# Patient Record
Sex: Female | Born: 1995 | Race: White | Hispanic: No | Marital: Single | Smoking: Former smoker
Health system: Southern US, Community
[De-identification: ages and names within clinical notes are randomized; demographics above are authoritative.]

## PROBLEM LIST (undated history)

## (undated) DIAGNOSIS — F329 Major depressive disorder, single episode, unspecified: Secondary | ICD-10-CM

## (undated) DIAGNOSIS — N946 Dysmenorrhea, unspecified: Secondary | ICD-10-CM

## (undated) DIAGNOSIS — G90A Postural orthostatic tachycardia syndrome (POTS): Secondary | ICD-10-CM

## (undated) DIAGNOSIS — F419 Anxiety disorder, unspecified: Secondary | ICD-10-CM

## (undated) DIAGNOSIS — G43109 Migraine with aura, not intractable, without status migrainosus: Secondary | ICD-10-CM

## (undated) DIAGNOSIS — N83209 Unspecified ovarian cyst, unspecified side: Secondary | ICD-10-CM

## (undated) DIAGNOSIS — F32A Depression, unspecified: Secondary | ICD-10-CM

## (undated) DIAGNOSIS — J45909 Unspecified asthma, uncomplicated: Secondary | ICD-10-CM

## (undated) DIAGNOSIS — T1491XA Suicide attempt, initial encounter: Secondary | ICD-10-CM

## (undated) HISTORY — PX: APPENDECTOMY: SHX54

## (undated) HISTORY — DX: Postural orthostatic tachycardia syndrome (POTS): G90.A

## (undated) HISTORY — DX: Dysmenorrhea, unspecified: N94.6

## (undated) HISTORY — DX: Unspecified ovarian cyst, unspecified side: N83.209

## (undated) HISTORY — DX: Migraine with aura, not intractable, without status migrainosus: G43.109

---

## 2015-11-27 ENCOUNTER — Encounter (HOSPITAL_COMMUNITY): Payer: Self-pay | Admitting: Emergency Medicine

## 2015-11-27 ENCOUNTER — Emergency Department (HOSPITAL_COMMUNITY): Payer: Self-pay

## 2015-11-27 ENCOUNTER — Emergency Department (HOSPITAL_COMMUNITY)
Admission: EM | Admit: 2015-11-27 | Discharge: 2015-11-27 | Disposition: A | Discharge status: Home / Self Care | Payer: Self-pay | Attending: Emergency Medicine | Admitting: Emergency Medicine

## 2015-11-27 DIAGNOSIS — F17200 Nicotine dependence, unspecified, uncomplicated: Secondary | ICD-10-CM | POA: Insufficient documentation

## 2015-11-27 DIAGNOSIS — J208 Acute bronchitis due to other specified organisms: Secondary | ICD-10-CM | POA: Insufficient documentation

## 2015-11-27 DIAGNOSIS — R05 Cough: Secondary | ICD-10-CM

## 2015-11-27 DIAGNOSIS — R059 Cough, unspecified: Secondary | ICD-10-CM

## 2015-11-27 DIAGNOSIS — J45909 Unspecified asthma, uncomplicated: Secondary | ICD-10-CM | POA: Insufficient documentation

## 2015-11-27 HISTORY — DX: Unspecified asthma, uncomplicated: J45.909

## 2015-11-27 MED ORDER — BENZONATATE 100 MG PO CAPS: 100.0000 mg | ORAL_CAPSULE | Freq: Three times a day (TID) | ORAL | Status: DC | PRN

## 2015-11-27 MED ORDER — PREDNISONE 20 MG PO TABS: 40.0000 mg | ORAL_TABLET | Freq: Every day | ORAL | Status: DC

## 2015-11-27 NOTE — ED Notes (Signed)
Pt. reports persistent productive cough with chest congestion for 3 weeks , denies fever /respirations unlabored , pt. added mild sore throat .

## 2015-11-27 NOTE — Discharge Instructions (Signed)

## 2015-11-27 NOTE — ED Provider Notes (Signed)
CSN: [id number]     Arrival date & time 11/27/15  1913 History   First MD Initiated Contact with Patient 11/27/15 2314     Chief Complaint  Patient presents with  . Cough     (Consider location/radiation/quality/duration/timing/severity/associated sxs/prior Treatment) Patient is a 20 y.o. female presenting with cough. The history is provided by the patient. No language interpreter was used.  Cough Cough characteristics:  Productive and harsh Sputum characteristics:  Clear Severity:  Moderate Onset quality:  Gradual Duration:  3 weeks Timing:  Intermittent Progression:  Improving Chronicity:  New Smoker: yes   Context: upper respiratory infection   Relieved by:  Nothing Worsened by:  Deep breathing Ineffective treatments:  Beta-agonist inhaler Associated symptoms: no chest pain, no chills, no fever, no myalgias, no rash, no rhinorrhea, no shortness of breath and no sinus congestion   Risk factors: recent infection (all symptoms except cough have resolved)     Past Medical History  Diagnosis Date  . Asthma    Past Surgical History  Procedure Laterality Date  . Appendectomy     No family history on file. Social History  Substance Use Topics  . Smoking status: Current Every Day Smoker  . Smokeless tobacco: None  . Alcohol Use: No   OB History    No data available      Review of Systems  Constitutional: Negative for fever and chills.  HENT: Negative for rhinorrhea.   Respiratory: Positive for cough. Negative for shortness of breath.   Cardiovascular: Negative for chest pain.  Gastrointestinal: Negative for nausea and vomiting.  Musculoskeletal: Negative for myalgias.  Skin: Negative for rash.  Neurological: Negative for syncope.  All other systems reviewed and are negative.   Allergies  Review of patient's allergies indicates no known allergies.  Home Medications   Prior to Admission medications   Medication Sig Start Date End Date Taking? Authorizing  Provider  benzonatate (TESSALON) 100 MG capsule Take 1 capsule (100 mg total) by mouth 3 (three) times daily as needed for cough. 11/27/15   Antony Madura, PA-C  predniSONE (DELTASONE) 20 MG tablet Take 2 tablets (40 mg total) by mouth daily. 11/27/15   Antony Madura, PA-C   BP 117/62 mmHg  Pulse 60  Temp(Src) 98.2 F (36.8 C) (Oral)  Resp 18  Ht 5' (1.524 m)  Wt 39.066 kg  BMI 16.82 kg/m2  SpO2 100%  LMP 11/15/2015 (Approximate)   Physical Exam  Constitutional: She is oriented to person, place, and time. She appears well-developed and well-nourished. No distress.  Nontoxic appearing  HENT:  Head: Normocephalic and atraumatic.  Eyes: Conjunctivae and EOM are normal. No scleral icterus.  Neck: Normal range of motion.  Cardiovascular: Normal rate, regular rhythm and intact distal pulses.   Pulmonary/Chest: Effort normal and breath sounds normal. No respiratory distress. She has no wheezes. She has no rales.  Lungs CTAB  Musculoskeletal: Normal range of motion.  Neurological: She is alert and oriented to person, place, and time.  Skin: Skin is warm and dry. No rash noted. She is not diaphoretic. No erythema. No pallor.  Psychiatric: She has a normal mood and affect. Her behavior is normal.  Nursing note and vitals reviewed.   ED Course  Procedures (including critical care time) Labs Review Labs Reviewed - No data to display  Imaging Review Dg Chest 2 View  11/27/2015  CLINICAL DATA:  Cough for 1 month. EXAM: CHEST  2 VIEW COMPARISON:  None. FINDINGS: The cardiac silhouette, mediastinal and hilar  contours are normal. The lungs demonstrate mild hyperinflation. There is also mild peribronchial thickening which could be related to smoking or bronchitis. No infiltrates or effusions. No pneumothorax. The bony thorax is intact. IMPRESSION: Hyperinflation and mild peribronchial thickening could be due to smoking, bronchitis or reactive airways disease. No focal infiltrates. Electronically  Signed   By: Rudie MeyerP.  Gallerani M.D.   On: 11/27/2015 21:29   I have personally reviewed and evaluated these images and lab results as part of my medical decision-making.   EKG Interpretation None      11:44 PM Patient PERC negative.  MDM   Final diagnoses:  Viral bronchitis  Cough    Pt CXR negative for acute infiltrate. Patients symptoms are consistent with likely viral bronchitis and secondary residual cough. Discussed that antibiotics are not indicated for viral infections. Patient will be discharged with symptomatic treatment. She verbalizes understanding and is agreeable with plan. Patient discharged in satisfactory condition with no unaddressed concerns.   Filed Vitals:   11/27/15 2021 11/27/15 2250  BP: 121/81 117/62  Pulse: 60 60  Temp: 98.6 F (37 C) 98.2 F (36.8 C)  TempSrc: Oral   Resp: 18 18  Height: 5' (1.524 m)   Weight: 39.066 kg   SpO2: 100% 100%     Antony MaduraKelly Holly Iannaccone, PA-C 11/27/15 2344  Lavera Guiseana Duo Liu, MD 11/28/15 1136

## 2016-06-16 ENCOUNTER — Emergency Department (HOSPITAL_COMMUNITY)
Admission: EM | Admit: 2016-06-16 | Discharge: 2016-06-17 | Disposition: A | Discharge status: Other Acute Inpatient Hospital | Payer: BLUE CROSS/BLUE SHIELD | Attending: Emergency Medicine | Admitting: Emergency Medicine

## 2016-06-16 ENCOUNTER — Encounter (HOSPITAL_COMMUNITY): Payer: Self-pay | Admitting: Emergency Medicine

## 2016-06-16 DIAGNOSIS — F419 Anxiety disorder, unspecified: Secondary | ICD-10-CM

## 2016-06-16 DIAGNOSIS — J45909 Unspecified asthma, uncomplicated: Secondary | ICD-10-CM | POA: Diagnosis not present

## 2016-06-16 DIAGNOSIS — F329 Major depressive disorder, single episode, unspecified: Secondary | ICD-10-CM | POA: Diagnosis not present

## 2016-06-16 DIAGNOSIS — F17200 Nicotine dependence, unspecified, uncomplicated: Secondary | ICD-10-CM | POA: Diagnosis not present

## 2016-06-16 DIAGNOSIS — F32A Depression, unspecified: Secondary | ICD-10-CM

## 2016-06-16 DIAGNOSIS — Z79899 Other long term (current) drug therapy: Secondary | ICD-10-CM | POA: Diagnosis not present

## 2016-06-16 HISTORY — DX: Anxiety disorder, unspecified: F41.9

## 2016-06-16 LAB — COMPREHENSIVE METABOLIC PANEL
ALT: 12 U/L — ABNORMAL LOW (ref 14–54)
AST: 22 U/L (ref 15–41)
Albumin: 4.2 g/dL (ref 3.5–5.0)
Alkaline Phosphatase: 47 U/L (ref 38–126)
Anion gap: 7 (ref 5–15)
BUN: 10 mg/dL (ref 6–20)
CALCIUM: 9.2 mg/dL (ref 8.9–10.3)
CO2: 24 mmol/L (ref 22–32)
Chloride: 105 mmol/L (ref 101–111)
Creatinine, Ser: 0.73 mg/dL (ref 0.44–1.00)
GFR calc non Af Amer: >60 mL/min (ref >60)
Glucose, Bld: 91 mg/dL (ref 65–99)
Potassium: 3.7 mmol/L (ref 3.5–5.1)
SODIUM: 136 mmol/L (ref 135–145)
Total Bilirubin: 0.4 mg/dL (ref 0.3–1.2)
Total Protein: 6.6 g/dL (ref 6.5–8.1)

## 2016-06-16 LAB — ACETAMINOPHEN LEVEL: Acetaminophen (Tylenol), Serum: <10 ug/mL — ABNORMAL LOW (ref 10–30)

## 2016-06-16 LAB — RAPID URINE DRUG SCREEN, HOSP PERFORMED
Amphetamines: NOT DETECTED
Barbiturates: NOT DETECTED
Benzodiazepines: NOT DETECTED
Cocaine: NOT DETECTED
OPIATES: NOT DETECTED
TETRAHYDROCANNABINOL: POSITIVE — AB

## 2016-06-16 LAB — CBC
HCT: 34.9 % — ABNORMAL LOW (ref 36.0–46.0)
HEMOGLOBIN: 11.5 g/dL — AB (ref 12.0–15.0)
MCH: 27.8 pg (ref 26.0–34.0)
MCHC: 33 g/dL (ref 30.0–36.0)
MCV: 84.5 fL (ref 78.0–100.0)
PLATELETS: 323 10*3/uL (ref 150–400)
RBC: 4.13 MIL/uL (ref 3.87–5.11)
RDW: 12.6 % (ref 11.5–15.5)
WBC: 8.2 10*3/uL (ref 4.0–10.5)

## 2016-06-16 LAB — ETHANOL

## 2016-06-16 LAB — SALICYLATE LEVEL

## 2016-06-16 NOTE — Progress Notes (Signed)
[facility].  Patient assigned to Bed 4 Accepting is Donell SievertSpencer Simon, [place] Call report to [phone].  [facility]ative was [facility] Torri.  Lot Medford K. Sherlon HandingHarris, LCAS-A, LPC-A, [facility]  Counselor 06/16/2016 10:54 PM

## 2016-06-16 NOTE — ED Notes (Signed)
TTS completed. 

## 2016-06-16 NOTE — BH Assessment (Signed)
Tele Assessment Note   Dawn Meza is an 21 y.o. female, Caucasian who [facility]: Pt states "ive been having really bad anxiety attacks, anything can trigger it, i've had thoughts of just giving up." Pt denies having a plan, just doesn't know how she can continue. Denies pain. Denies drinking alcohol, uses weed. Patient states primary concern is of feelings of depression/ lack of energy and panic attacks. Patient states that she was at work today and had a panic attack. Patient states that she is in college online, and does live alone. Patient states that she has been battling depression/anxiety. Patient denies SI/HI and AVH. Patient denies hx. Of S.A. Patient denies hx of inpatient or outpatient psych care.  Patient is dressed in scrubs and is alert and oriented x4. Patient speech was within normal limits and motor behavior appeared normal. Patient thought process is coherent. Patient  does not appear to be responding to internal stimuli. Patient was cooperative throughout the assessment and states that  she is agreeable to inpatient psychiatric treatment.   Diagnosis: Major Depressive Disorder, Current Episode, Severe  Past Medical History:  Past Medical History:  Diagnosis Date  . Anxiety   . Asthma     Past Surgical History:  Procedure Laterality Date  . APPENDECTOMY      Family History: No family history on file.  Social History:  reports that she has been smoking.  She does not have any smokeless tobacco history on file. She reports that she does not drink alcohol or use drugs.  Additional Social History:  Alcohol / Drug Use Pain Medications: SEE MAR Prescriptions: SEE MAR Over the Counter: SEE MAR History of alcohol / drug use?: No history of alcohol / drug abuse  CIWA: CIWA-Ar BP: 133/59 Pulse Rate: 111 COWS:    PATIENT STRENGTHS: (choose at least two) Average or above average intelligence Capable of independent living Communication  skills  Allergies: No Known Allergies  Home Medications:  (Not in a hospital admission)  OB/GYN Status:  Patient's last menstrual period was 05/30/2016.  General Assessment Data [facility] Assessment: In system Is this a Tele or Face-to-Face Assessment?: Tele Assessment Is this an Initial Assessment or a Re-assessment for this encounter?: Initial Assessment Marital status: Single Maiden name: n/a Is patient pregnant?: No Pregnancy Status: No Living Arrangements: Alone Can pt return to current living arrangement?: Yes Admission Status: Voluntary Is patient capable of signing voluntary admission?: Yes Referral Source: Self/Family/Friend Insurance type: BCBS     Crisis Care Plan Living Arrangements: Alone Name of Psychiatrist: none Name of Therapist: none  Education Status Is patient currently in school?: No Current Grade: n/a Highest grade of school patient has completed: some college  Name of school: n/a Contact person: none given  Risk to self with the past 6 months Suicidal Ideation: No Has patient been a risk to self within the past 6 months prior to admission? : No Suicidal Intent: No Has patient had any suicidal intent within the past 6 months prior to admission? : No Is patient at risk for suicide?: Yes Suicidal Plan?: No Has patient had any suicidal plan within the past 6 months prior to admission? : No Access to Means: No What has been your use of drugs/alcohol within the last 12 months?: none Previous Attempts/Gestures: No How many times?: 0 Other Self Harm Risks: none noted Triggers for Past Attempts: None known Intentional Self Injurious Behavior: None Family Suicide History: No  Recent stressful life event(s): Turmoil (Comment) Persecutory voices/beliefs?: No Depression: Yes Depression Symptoms: Despondent, Insomnia, Tearfulness, Isolating, Fatigue, Guilt, Loss of interest in usual pleasures, Feeling worthless/self  pity Substance abuse history and/or treatment for substance abuse?: No Suicide prevention information given to non-admitted patients: Yes  Risk to Others within the past 6 months Homicidal Ideation: No Does patient have any lifetime risk of violence toward others beyond the six months prior to admission? : No Thoughts of Harm to Others: No Current Homicidal Intent: No Current Homicidal Plan: No Access to Homicidal Means: No Identified Victim: none History of harm to others?: No Assessment of Violence: None Noted Violent Behavior Description: none Does patient have access to weapons?: No Criminal Charges Pending?: No Does patient have a court date: No Is patient on probation?: No  Psychosis Hallucinations: None noted Delusions: None noted  Mental Status Report Appearance/Hygiene: In scrubs Eye Contact: Fair Motor Activity: Freedom of movement Speech: Logical/coherent Level of Consciousness: Alert Mood: Depressed Affect: Depressed Anxiety Level: Panic Attacks Panic attack frequency: weekly Most recent panic attack: 06-16-16 Thought Processes: Relevant Judgement: Unimpaired Orientation: Person, Place, Time, Situation, Appropriate for developmental age Obsessive Compulsive Thoughts/Behaviors: None  Cognitive Functioning Concentration: Normal Memory: Recent Intact, Remote Intact IQ: Average Insight: Fair Impulse Control: Poor Appetite: Fair Weight Loss: 0 Weight Gain: 0 Sleep: Decreased Total Hours of Sleep: 5 Vegetative Symptoms: None  ADLScreening [facility] Assessment Services) Patient's cognitive ability adequate to safely complete daily activities?: Yes Patient able to express need for assistance with ADLs?: Yes Independently performs ADLs?: Yes (appropriate for developmental age)  Prior Inpatient Therapy Prior Inpatient Therapy: No Prior Therapy Dates: n/a Prior Therapy Facilty/Provider(s): n/a Reason for Treatment: n/a  Prior Outpatient Therapy Prior  Outpatient Therapy: No Prior Therapy Dates: n/a Prior Therapy Facilty/Provider(s): n/a Reason for Treatment: n/a Does patient have an ACCT team?: No Does patient have Intensive In-House Services?  : No Does patient have [facility] services? : No Does patient have P4CC services?: No  ADL Screening (condition at time of admission) Patient's cognitive ability adequate to safely complete daily activities?: Yes Is the patient deaf or have difficulty hearing?: No Does the patient have difficulty seeing, even when wearing glasses/contacts?: No Does the patient have difficulty concentrating, remembering, or making decisions?: No Patient able to express need for assistance with ADLs?: Yes Does the patient have difficulty dressing or bathing?: No Independently performs ADLs?: Yes (appropriate for developmental age) Does the patient have difficulty walking or climbing stairs?: No Weakness of Legs: None Weakness of Arms/Hands: None       Abuse/Neglect Assessment (Assessment to be complete while patient is alone) Physical Abuse: Denies Verbal Abuse: Denies Sexual Abuse: Denies Exploitation of patient/patient's resources: Denies Self-Neglect: Denies Values / Beliefs Cultural Requests During Hospitalization: None Spiritual Requests During Hospitalization: None   Advance Directives (For Healthcare) Does Patient Have a Medical Advance Directive?: No    Additional Information 1:1 In Past 12 Months?: No CIRT Risk: No Elopement Risk: No Does patient have medical clearance?: Yes     Disposition: Per Karleen Hampshire, PA does not meet inpatient criteria recommend OBS Disposition Initial Assessment Completed for this Encounter: Yes Disposition of Patient: Other dispositions (TBD)  Dawn Meza Dawn Meza 06/16/2016 10:32 PM

## 2016-06-16 NOTE — ED Notes (Signed)
Staffing called for a sitter 

## 2016-06-16 NOTE — ED Triage Notes (Signed)
Pt states "ive been having really bad anxiety attacks, anything can trigger it, i've had thoughts of just giving up." Pt denies having a plan, just doesn't know how she can continue. Denies pain. Denies drinking alcohol, uses weed.

## 2016-06-16 NOTE — ED Notes (Signed)
Staffing Office advised do not have sitter at this time.

## 2016-06-16 NOTE — ED Provider Notes (Signed)
[facility] Provider Note   CSN: [id number] Arrival date & time: 06/16/16  1835     History   Chief Complaint Chief Complaint  Patient presents with  . Suicidal  . Anxiety  . Medical Clearance    HPI Dawn Meza is a 21 y.o. female.  The history is provided by the patient.  Mental Health Problem  Presenting symptoms: depression   Presenting symptoms: no hallucinations, no homicidal ideas, no suicidal thoughts, no suicidal threats and no suicide attempt   Presenting symptoms comment:  Anxiety  Patient accompanied by:  Family member Degree of incapacity (severity):  Moderate Onset quality:  Gradual Duration: several years. Timing:  Intermittent Progression:  Worsening Chronicity:  Chronic Context: drug abuse (marijuana)   Context: not alcohol use, not medication, not recent medication change and not stressful life event   Treatment compliance:  Untreated Relieved by:  Nothing Worsened by:  Nothing Associated symptoms: anhedonia, anxiety and fatigue   Associated symptoms: no chest pain, no feelings of worthlessness and no poor judgment   Risk factors: hx of mental illness   Risk factors: no hx of suicide attempts       Past Medical History:  Diagnosis Date  . Anxiety   . Asthma     There are no active problems to display for this patient.   Past Surgical History:  Procedure Laterality Date  . APPENDECTOMY      OB History    No data available       Home Medications    Prior to Admission medications   Medication Sig Start Date End Date Taking? Authorizing Provider  benzonatate (TESSALON) 100 MG capsule Take 1 capsule (100 mg total) by mouth 3 (three) times daily as needed for cough. 11/27/15   Antony MaduraKelly Humes, PA-C  predniSONE (DELTASONE) 20 MG tablet Take 2 tablets (40 mg total) by mouth daily. 11/27/15   Antony MaduraKelly Humes, PA-C    Family History No family history on file.  Social History Social History  Substance Use Topics  . Smoking status:  Current Every Day Smoker  . Smokeless tobacco: Not on file  . Alcohol use No     Allergies   Patient has no known allergies.   Review of Systems Review of Systems  Constitutional: Positive for fatigue.  Cardiovascular: Negative for chest pain.  Psychiatric/Behavioral: Negative for hallucinations, homicidal ideas and suicidal ideas. The patient is nervous/anxious.   Ten systems are reviewed and are negative for acute change except as noted in the HPI    Physical Exam Updated Vital Signs BP 133/59   Pulse 111   Temp 98.6 F (37 C) (Oral)   Resp 20   Ht 5' (1.524 m)   Wt 84 lb (38.1 kg)   LMP 05/30/2016   SpO2 100%   BMI 16.41 kg/m   Physical Exam  Constitutional: She is oriented to person, place, and time. She appears well-developed and well-nourished. No distress.  HENT:  Head: Normocephalic and atraumatic.  Nose: Nose normal.  Eyes: Conjunctivae and EOM are normal. Pupils are equal, round, and reactive to light. Right eye exhibits no discharge. Left eye exhibits no discharge. No scleral icterus.  Neck: Normal range of motion. Neck supple.  Cardiovascular: Normal rate and regular rhythm.  Exam reveals no gallop and no friction rub.   No murmur heard. Pulmonary/Chest: Effort normal and breath sounds normal. No stridor. No respiratory distress. She has no rales.  Abdominal: Soft. She exhibits no distension. There is no tenderness.  Musculoskeletal: She exhibits no edema or tenderness.  Neurological: She is alert and oriented to person, place, and time.  Skin: Skin is warm and dry. No rash noted. She is not diaphoretic. No erythema.  Psychiatric: She has a normal mood and affect. Thought content is not paranoid and not delusional. She expresses no homicidal and no suicidal ideation. She expresses no suicidal plans and no homicidal plans.  Vitals reviewed.    ED Treatments / Results  Labs (all labs ordered are listed, but only abnormal results are displayed) Labs  Reviewed  COMPREHENSIVE METABOLIC PANEL - Abnormal; Notable for the following:       Result Value   ALT 12 (*)    All other components within normal limits  ACETAMINOPHEN LEVEL - Abnormal; Notable for the following:    Acetaminophen (Tylenol), Serum <10 (*)    All other components within normal limits  CBC - Abnormal; Notable for the following:    Hemoglobin 11.5 (*)    HCT 34.9 (*)    All other components within normal limits  RAPID URINE DRUG SCREEN, HOSP PERFORMED - Abnormal; Notable for the following:    Tetrahydrocannabinol POSITIVE (*)    All other components within normal limits  ETHANOL  SALICYLATE LEVEL    EKG  EKG Interpretation None       Radiology No results found.  Procedures Procedures (including critical care time)  Medications Ordered in ED Medications - No data to display   Initial Impression / Assessment and Plan / ED Course  I have reviewed the triage vital signs and the nursing notes.  Pertinent labs & imaging results that were available during my care of the patient were reviewed by me and considered in my medical decision making (see chart for details).     Medically cleared for [facility] evaluation and management. Patient was evaluated by behavioral health and will be admitted for observation.  Final Clinical Impressions(s) / ED Diagnoses   Final diagnoses:  Depression, unspecified depression type  Anxiety      Nira Conn, MD 06/16/16 [phone]

## 2016-06-17 ENCOUNTER — Telehealth (HOSPITAL_COMMUNITY): Payer: Self-pay | Admitting: Licensed Clinical Social Worker

## 2016-06-17 ENCOUNTER — Observation Stay (HOSPITAL_COMMUNITY)
Admission: EM | Admit: 2016-06-17 | Discharge: 2016-06-17 | Disposition: A | Discharge status: Home / Self Care | Payer: BLUE CROSS/BLUE SHIELD | Source: Intra-hospital | Attending: Psychiatry | Admitting: Psychiatry

## 2016-06-17 ENCOUNTER — Encounter (HOSPITAL_COMMUNITY): Payer: Self-pay | Admitting: *Deleted

## 2016-06-17 DIAGNOSIS — Z79899 Other long term (current) drug therapy: Secondary | ICD-10-CM | POA: Diagnosis not present

## 2016-06-17 DIAGNOSIS — F322 Major depressive disorder, single episode, severe without psychotic features: Secondary | ICD-10-CM | POA: Insufficient documentation

## 2016-06-17 DIAGNOSIS — Z9889 Other specified postprocedural states: Secondary | ICD-10-CM | POA: Diagnosis not present

## 2016-06-17 DIAGNOSIS — Z818 Family history of other mental and behavioral disorders: Secondary | ICD-10-CM | POA: Diagnosis not present

## 2016-06-17 DIAGNOSIS — F332 Major depressive disorder, recurrent severe without psychotic features: Secondary | ICD-10-CM | POA: Diagnosis not present

## 2016-06-17 DIAGNOSIS — Z825 Family history of asthma and other chronic lower respiratory diseases: Secondary | ICD-10-CM

## 2016-06-17 DIAGNOSIS — F4323 Adjustment disorder with mixed anxiety and depressed mood: Secondary | ICD-10-CM | POA: Diagnosis present

## 2016-06-17 MED ORDER — HYDROXYZINE HCL 25 MG PO TABS
25.0000 mg | ORAL_TABLET | Freq: Four times a day (QID) | ORAL | Status: DC | PRN
  Administered 2016-06-17: 25 mg via ORAL
  Filled 2016-06-17: qty 1

## 2016-06-17 MED ORDER — ESCITALOPRAM OXALATE 10 MG PO TABS
10.0000 mg | ORAL_TABLET | Freq: Every day | ORAL | Status: DC
Start: 2016-06-17 — End: 2016-06-17
  Administered 2016-06-17: 10 mg via ORAL
  Filled 2016-06-17: qty 1

## 2016-06-17 MED ORDER — ALUM & MAG HYDROXIDE-SIMETH 200-200-20 MG/5ML PO SUSP: 30.0000 mL | ORAL | Status: DC | PRN

## 2016-06-17 MED ORDER — ALBUTEROL SULFATE HFA 108 (90 BASE) MCG/ACT IN AERS
2.0000 | INHALATION_SPRAY | RESPIRATORY_TRACT | Status: DC | PRN
Start: 2016-06-17 — End: 2016-06-17
  Administered 2016-06-17: 2 via RESPIRATORY_TRACT

## 2016-06-17 MED ORDER — MAGNESIUM HYDROXIDE 400 MG/5ML PO SUSP: 30.0000 mL | Freq: Every day | ORAL | Status: DC | PRN

## 2016-06-17 MED ORDER — TRAZODONE HCL 50 MG PO TABS: 50.0000 mg | ORAL_TABLET | Freq: Every evening | ORAL | Status: DC | PRN

## 2016-06-17 MED ORDER — ACETAMINOPHEN 325 MG PO TABS
650.0000 mg | ORAL_TABLET | Freq: Four times a day (QID) | ORAL | Status: DC | PRN
Start: 2016-06-17 — End: 2016-06-17

## 2016-06-17 MED ORDER — ALBUTEROL SULFATE HFA 108 (90 BASE) MCG/ACT IN AERS: 2.0000 | INHALATION_SPRAY | RESPIRATORY_TRACT | 0 refills | Status: DC | PRN

## 2016-06-17 MED ORDER — ESCITALOPRAM OXALATE 10 MG PO TABS: 10.0000 mg | ORAL_TABLET | Freq: Every day | ORAL | 0 refills | Status: DC

## 2016-06-17 MED ORDER — HYDROXYZINE HCL 25 MG PO TABS: 25.0000 mg | ORAL_TABLET | Freq: Four times a day (QID) | ORAL | 0 refills | Status: DC | PRN

## 2016-06-17 MED ORDER — INFLUENZA VAC SPLIT QUAD 0.5 ML IM SUSY
0.5000 mL | PREFILLED_SYRINGE | INTRAMUSCULAR | Status: DC
  Filled 2016-06-17: qty 0.5

## 2016-06-17 MED ORDER — ALBUTEROL SULFATE HFA 108 (90 BASE) MCG/ACT IN AERS
INHALATION_SPRAY | RESPIRATORY_TRACT | Status: AC
  Administered 2016-06-17: 2 via RESPIRATORY_TRACT
  Filled 2016-06-17: qty 6.7

## 2016-06-17 NOTE — Progress Notes (Signed)
Patient is stressed about everything. This Clinical research associatewriter believes she would benefit from anti anxiety medications. Patient stated she just recently received insurance and is in need of a primary care dr.

## 2016-06-17 NOTE — Discharge Summary (Signed)
[facility] Dis[facility] Summary Note  Patient:  Dawn Meza is an 21 y.o., female MRN:  [id number] DOB:  10/12/1995 Patient phone:  [phone] (home)  Patient address:   [address] [place] [id number],  Total Time spent with patient: 20 minutes  Date of Admission:  06/17/2016 Date of Discharge: 06/07/2016  Reason for Admission:  Anxiety with Panic attacks  Principal Problem:MDD (major depressive disorder), single episode, severe , no psychosis [facility](HCC)  Discharge Diagnoses: Patient Active Problem List   Diagnosis Date Noted  . MDD (major depressive disorder), single episode, severe , no psychosis (HCC) [F32.2] 06/17/2016    Past Psychiatric History: Anxiety  Past Medical History:  Past Medical History:  Diagnosis Date  . Anxiety   . Asthma     Past Surgical History:  Procedure Laterality Date  . APPENDECTOMY     Family History: History reviewed. No pertinent family history. Family Psychiatric  History: unknown Social History:  History  Alcohol Use No     History  Drug Use No    Social History   Social History  . Marital status: Single    Spouse name: N/A  . Number of children: N/A  . Years of education: N/A   Social History Main Topics  . Smoking status: Never Smoker  . Smokeless tobacco: Never Used  . Alcohol use No  . Drug use: No  . Sexual activity: Yes    Partners: Male    Birth control/ protection: None   Other Topics Concern  . None   Social History Narrative  . None    Hospital Course:  Dawn RamMadison Kolenda is a 21 year old Caucasian female [facility] and panic [facility].  Pt denies suicidal/homicidal ideation, denies auditory/visual hallucinations and does not appear to be responding to internal stimuli. Pt was calm and cooperative, alert & oriented, appropriate for the situation and spent the night in the OBS unit without incident. Pt stated she has never been on medication for her anxiety before. Pt works  in a [employer] and is also on [employer] college student. Pt stated she feels safe to go home today and return to her life. Pt was started on Lexapro 10 mg PO QD and Vistaril 25 mg Q6hr PO PRN, prescriptions for these medications were provided to patient upon discharge with instructions to follow up with her PCP for medication management. Pt is also being referred to outpatient therapy services.   Physical Findings: AIMS: Facial and Oral Movements Muscles of Facial Expression: None, normal Lips and Perioral Area: None, normal Jaw: None, normal Tongue: None, normal,Extremity Movements Upper (arms, wrists, hands, fingers): None, normal Lower (legs, knees, ankles, toes): None, normal, Trunk Movements Neck, shoulders, hips: None, normal, Overall Severity Severity of abnormal movements (highest score from questions above): None, normal Incapacitation due to abnormal movements: None, normal Patient's awareness of abnormal movements (rate only patient's report): No Awareness, Dental Status Current problems with teeth and/or dentures?: No Does patient usually wear dentures?: No  CIWA:    COWS:     Musculoskeletal: Strength & Muscle Tone: within normal limits Gait & Station: normal Patient leans: N/A  Psychiatric Specialty Exam: Physical Exam  Constitutional: She is oriented to person, place, and time. She appears well-developed and well-nourished.  Musculoskeletal: Normal range of motion.  Neurological: She is alert and oriented to person, place, and time.  Skin: Skin is warm and dry.    Review of Systems  Psychiatric/Behavioral: Positive for  depression. Negative for hallucinations, memory loss, substance abuse and suicidal ideas. The patient is nervous/anxious. The patient does not have insomnia.   All other systems reviewed and are negative.   Blood pressure (!) 98/55, pulse 67, temperature 98.4 F (36.9 C), temperature source Oral, resp. rate 16, height 5' (1.524 m), weight 37.6 kg  (83 lb), last menstrual period 05/30/2016, SpO2 100 %.Body mass index is 16.21 kg/m.  General Appearance: Casual  Eye Contact:  Good  Speech:  Clear and Coherent and Normal Rate  Volume:  Normal  Mood:  Anxious and Depressed  Affect:  Congruent  Thought Process:  Coherent, Goal Directed and Linear  Orientation:  Full (Time, Place, and Person)  Thought Content:  Logical  Suicidal Thoughts:  No  Homicidal Thoughts:  No  Memory:  Immediate;   Good Recent;   Good Remote;   Fair  Judgement:  Good  Insight:  Good  Psychomotor Activity:  Normal  Concentration:  Concentration: Good and Attention Span: Good  Recall:  Good  Fund of Knowledge:  Good  Language:  Good  Akathisia:  No  Handed:  Right  AIMS (if indicated):     Assets:  Communication Skills Desire for Improvement Financial Resources/Insurance Housing Leisure Time Physical Health Resilience Social Support Transportation Vocational/Educational  ADL's:  Intact  Cognition:  WNL  Sleep:        Have you used any form of tobacco in the last 30 days? (Cigarettes, Smokeless Tobacco, Cigars, and/or Pipes): No  Has this patient used any form of tobacco in the last 30 days? (Cigarettes, Smokeless Tobacco, Cigars, and/or Pipes) Yes, No  Blood Alcohol level:  Lab Results  Component Value Date   ETH <5 06/16/2016    Metabolic Disorder Labs:  No results found for: HGBA1C, MPG No results found for: PROLACTIN No results found for: CHOL, TRIG, HDL, CHOLHDL, VLDL, LDLCALC  See Psychiatric Specialty Exam and Suicide Risk Assessment completed by Attending Physician prior to discharge.  Discharge destination:  Home  Is patient on multiple antipsychotic therapies at discharge:  No   Has Patient had three or more failed trials of antipsychotic monotherapy by history:  No  Recommended Plan for Multiple Antipsychotic Therapies: NA   Allergies as of 06/17/2016   No Known Allergies     Medication List    TAKE these  medications     Indication  albuterol 108 (90 Base) MCG/ACT inhaler Commonly known as:  PROVENTIL HFA;VENTOLIN HFA Inhale 2 puffs into the lungs as needed for wheezing or shortness of breath.  Indication:  Asthma   escitalopram 10 MG tablet Commonly known as:  LEXAPRO Take 1 tablet (10 mg total) by mouth daily. Start taking on:  06/18/2016  Indication:  Generalized Anxiety Disorder   hydrOXYzine 25 MG tablet Commonly known as:  ATARAX/VISTARIL Take 1 tablet (25 mg total) by mouth every 6 (six) hours as needed for anxiety.  Indication:  Anxiety Neurosis        Follow-up recommendations:  Activity:  as tolerated Diet:  Regular  Other:  stay well hydrated  Follow up with PCP for medication management Attend outpatient therapy Remain on Lexapro, it may take several weeks to become therapeutic  Comments:  Discharge home  Signed: Laveda Abbe, NP 06/17/2016, 9:27 AM

## 2016-06-17 NOTE — Discharge Planning (Signed)
[facility] Case Management Discharge Plan :  Will you be returning to the same living situation after discharge:  Yes,  Patient being discharged home  At discharge, do you have transportation home?: Yes,  Mother will provide transport home  Do you have the ability to pay for your medications: Yes,  Patient has insurance  Release of information consent forms completed and in the chart;  Patient's signature needed at discharge.  Patient to Follow up at: Follow-up Information    [facility] Follow up.   Specialty:  Behavioral Health Why:  Personnel with call patient post-discharge (after 3pm on June 17, 2016) Contact information: [address] [place] [id number] [phone]          Safety Planning and Suicide Prevention discussed: Yes,  Patient verbalized understanding   Camelia EngKaren H Stephani Janak 06/17/2016, 9:55 AM
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
## 2016-06-17 NOTE — Progress Notes (Signed)
Written/verbal discharge instructions, prescriptions and follow-up information given to patient with verbalization of understanding;  Patient denies suicidal and homicidal ideation. Suicide Prevention information/materials given to patient  All patient belongings returned to patient at time of discharge. Discharged home in stable condition with mother.

## 2016-06-17 NOTE — BHH Counselor (Signed)
This Clinical research associatewriter made contact with [place] [employer] in regards to scheduling a follow up appointment for this pt. Pt spoke with Pauline AusKimberly Grice who was provided with pt MR number and insurance information. Cala BradfordKimberly suggested that PHP program (Partial Hospitalization Program) for this program. Pt was informed about this program from this Clinical research associatewriter and provided with a brochure. This Clinical research associatewriter informed pt that she would receive a phone call from Donia GuilesJenny Edminson who is the group leader for this program will be contacting her post discharge after 3pm to get some information from her. Pt was receptive of the information provided to her.

## 2016-06-17 NOTE — Progress Notes (Signed)
[facility] OBSERVATION UNIT:  Family/Significant Other Suicide Prevention Education  Suicide Prevention Education:  Education Completed; [name] (Patient's mother), has been identified by the patient as the family member/significant other with whom the patient will be residing, and identified as the person(s) who will aid the patient in the event of a mental health crisis (suicidal ideations/suicide attempt).  With written consent from the patient, the family member/significant other has been provided the following suicide prevention education, prior to the and/or following the discharge of the patient.  The suicide prevention education provided includes the following:  Suicide risk factors  Suicide prevention and interventions  National Suicide Hotline telephone number  [facility] assessment telephone number  [facility] Emergency Assistance 911  [facility] and/or Residential Mobile Crisis Unit telephone number  Request made of family/significant other to:  Remove weapons (e.g., guns, rifles, knives), all items previously/currently identified as safety concern.    Remove drugs/medications (over-the-counter, prescriptions, illicit drugs), all items previously/currently identified as a safety concern.  The family member/significant other verbalizes understanding of the suicide prevention education information provided.  The family member/significant other agrees to remove the items of safety concern listed above.  Dawn Meza 06/17/2016, 10:05 AM   Dawn EngKaren Meza Dawn Lacasse, RN 06/17/16  10:05 AM

## 2016-06-17 NOTE — Progress Notes (Signed)
D:  Patient awake and alert; oriented x 4; she denies suicidal and homicidal ideation and AVH; no self-injurious behaviors noted or reported. A:  Medications given as scheduled;  Emotional support provided; encouraged her to seek assistance with needs/concerns. R:  Safety maintained on unit. 

## 2016-06-18 NOTE — H&P (Signed)
[place] Observation Unit Provider Admission PAA/H&P  Patient Identification: Dawn Meza MRN:  [id number] Date of Evaluation:  06/18/2016 Chief Complaint:  mdd Principal Diagnosis: MDD (major depressive disorder), single episode, severe , no psychosis ([place]) Diagnosis:   Patient Active Problem List   Diagnosis Date Noted  . MDD (major depressive disorder), single episode, severe , no psychosis ([place]) [F32.2] 06/17/2016   History of Present Illness:Dawn Meza is an 21 y.o. female, Caucasian transferred from [facility] ED for stabilization and treatment of really bad anxiety, having thoughts of giving up and reporting that anything can trigger her anxiety.  Pt denies having a plan to hurt self, just doesn't know how she can continue. Denies pain. Denies drinking alcohol, uses weed. Patient states primary concern is of feelings of depression/ lack of energy and panic attacks. Patient states that she was at work today and had a panic attack. Patient states that she is in college online, and does live alone. Patient states that she has been battling depression/anxiety. Patient denies SI/HI and AVH. Patient denies hx. Of S.A. Patient denies hx of inpatient or outpatient psych care.    Past Psychiatric History: None  Is the patient at risk to self? Yes.   Not sure how she can keep herself safe as she is really anxious Has the patient been a risk to self in the past 6 months? No.  Has the patient been a risk to self within the distant past? No.  Is the patient a risk to others? No.  Has the patient been a risk to others in the past 6 months? No.  Has the patient been a risk to others within the distant past? No.   Prior Inpatient Therapy:   Prior Outpatient Therapy:    Alcohol Screening: 1. How often do you have a drink containing alcohol?: Never 9. Have you or someone else been injured as a result of your drinking?: No 10. Has a relative or friend or a doctor or another health worker been concerned  about your drinking or suggested you cut down?: No Alcohol Use Disorder Identification Test Final Score (AUDIT): 0 Brief Intervention: Patient declined brief intervention Substance Abuse History in the last 12 months:  No. Consequences of Substance Abuse: Negative Previous Psychotropic Medications: No  Psychological Evaluations: No  Past Medical History:  Past Medical History:  Diagnosis Date  . Anxiety   . Asthma     Past Surgical History:  Procedure Laterality Date  . APPENDECTOMY     Family History: History reviewed. No pertinent family history. Family Psychiatric History: none reported Tobacco Screening: Have you used any form of tobacco in the last 30 days? (Cigarettes, Smokeless Tobacco, Cigars, and/or Pipes): No Social History:  History  Alcohol Use No     History  Drug Use No    Additional Social History:                           Allergies:  No Known Allergies Lab Results:  Results for orders placed or performed during the hospital encounter of 06/16/16 (from the past 48 hour(s))  Comprehensive metabolic panel     Status: Abnormal   Collection Time: 06/16/16  6:49 PM  Result Value Ref Range   Sodium 136 135 - 145 mmol/L   Potassium 3.7 3.5 - 5.1 mmol/L   Chloride 105 101 - 111 mmol/L   CO2 24 22 - 32 mmol/L   Glucose, Bld 91 65 - 99  mg/dL   BUN 10 6 - 20 mg/dL   Creatinine, Ser 0.73 0.44 - 1.00 mg/dL   Calcium 9.2 8.9 - 10.3 mg/dL   Total Protein 6.6 6.5 - 8.1 g/dL   Albumin 4.2 3.5 - 5.0 g/dL   AST 22 15 - 41 U/L   ALT 12 (L) 14 - 54 U/L   Alkaline Phosphatase 47 38 - 126 U/L   Total Bilirubin 0.4 0.3 - 1.2 mg/dL   GFR calc non Af Amer >60 >60 mL/min   GFR calc Af Amer >60 >60 mL/min    Comment: (NOTE) The eGFR has been calculated using the CKD EPI equation. This calculation has not been validated in all clinical situations. eGFR's persistently <60 mL/min signify possible Chronic Kidney Disease.    Anion gap 7 5 - 15  Ethanol      Status: None   Collection Time: 06/16/16  6:49 PM  Result Value Ref Range   Alcohol, Ethyl (B) <5 <5 mg/dL    Comment:        LOWEST DETECTABLE LIMIT FOR SERUM ALCOHOL IS 5 mg/dL FOR MEDICAL PURPOSES ONLY   Salicylate level     Status: None   Collection Time: 06/16/16  6:49 PM  Result Value Ref Range   Salicylate Lvl <2.9 2.8 - 30.0 mg/dL  Acetaminophen level     Status: Abnormal   Collection Time: 06/16/16  6:49 PM  Result Value Ref Range   Acetaminophen (Tylenol), Serum <10 (L) 10 - 30 ug/mL    Comment:        THERAPEUTIC CONCENTRATIONS VARY SIGNIFICANTLY. A RANGE OF 10-30 ug/mL MAY BE AN EFFECTIVE CONCENTRATION FOR MANY PATIENTS. HOWEVER, SOME ARE BEST TREATED AT CONCENTRATIONS OUTSIDE THIS RANGE. ACETAMINOPHEN CONCENTRATIONS >150 ug/mL AT 4 HOURS AFTER INGESTION AND >50 ug/mL AT 12 HOURS AFTER INGESTION ARE OFTEN ASSOCIATED WITH TOXIC REACTIONS.   cbc     Status: Abnormal   Collection Time: 06/16/16  6:49 PM  Result Value Ref Range   WBC 8.2 4.0 - 10.5 K/uL   RBC 4.13 3.87 - 5.11 MIL/uL   Hemoglobin 11.5 (L) 12.0 - 15.0 g/dL   HCT 34.9 (L) 36.0 - 46.0 %   MCV 84.5 78.0 - 100.0 fL   MCH 27.8 26.0 - 34.0 pg   MCHC 33.0 30.0 - 36.0 g/dL   RDW 12.6 11.5 - 15.5 %   Platelets 323 150 - 400 K/uL  Rapid urine drug screen (hospital performed)     Status: Abnormal   Collection Time: 06/16/16  6:53 PM  Result Value Ref Range   Opiates NONE DETECTED NONE DETECTED   Cocaine NONE DETECTED NONE DETECTED   Benzodiazepines NONE DETECTED NONE DETECTED   Amphetamines NONE DETECTED NONE DETECTED   Tetrahydrocannabinol POSITIVE (A) NONE DETECTED   Barbiturates NONE DETECTED NONE DETECTED    Comment:        DRUG SCREEN FOR MEDICAL PURPOSES ONLY.  IF CONFIRMATION IS NEEDED FOR ANY PURPOSE, NOTIFY LAB WITHIN 5 DAYS.        LOWEST DETECTABLE LIMITS FOR URINE DRUG SCREEN Drug Class       Cutoff (ng/mL) Amphetamine      1000 Barbiturate      200 Benzodiazepine    476 Tricyclics       546 Opiates          300 Cocaine          300 THC  50     Blood Alcohol level:  Lab Results  Component Value Date   ETH <5 00/17/4944    Metabolic Disorder Labs:  No results found for: HGBA1C, MPG No results found for: PROLACTIN No results found for: CHOL, TRIG, HDL, CHOLHDL, VLDL, LDLCALC  Current Medications: No current facility-administered medications for this encounter.    Current Outpatient Prescriptions  Medication Sig Dispense Refill  . albuterol (PROVENTIL HFA;VENTOLIN HFA) 108 (90 Base) MCG/ACT inhaler Inhale 2 puffs into the lungs as needed for wheezing or shortness of breath. 1 Inhaler 0  . escitalopram (LEXAPRO) 10 MG tablet Take 1 tablet (10 mg total) by mouth daily. 30 tablet 0  . hydrOXYzine (ATARAX/VISTARIL) 25 MG tablet Take 1 tablet (25 mg total) by mouth every 6 (six) hours as needed for anxiety. 30 tablet 0   PTA Medications: No prescriptions prior to admission.    Musculoskeletal: Strength & Muscle Tone: within normal limits Gait & Station: normal Patient leans: N/A  Psychiatric Specialty Exam: Physical Exam  ROS  Blood pressure (!) 98/55, pulse 67, temperature 98.4 F (36.9 C), temperature source Oral, resp. rate 16, height 5' (1.524 m), weight 37.6 kg (83 lb), last menstrual period 05/30/2016, SpO2 100 %.Body mass index is 16.21 kg/m.  General Appearance: Disheveled  Eye Contact:  Fair  Speech:  Clear and Coherent and Normal Rate  Volume:  Normal  Mood:  Angry, Depressed and Hopeless  Affect:  Constricted  Thought Process:  Coherent and Goal Directed  Orientation:  Full (Time, Place, and Person)  Thought Content:  Rumination  Suicidal Thoughts:  No  Homicidal Thoughts:  No  Memory:  Immediate;   Fair Recent;   Fair Remote;   Fair  Judgement:  Impaired  Insight:  Present  Psychomotor Activity:  Normal  Concentration:  Concentration: Poor and Attention Span: Fair  Recall:  [employer] of Knowledge:   Fair  Language:  Fair  Akathisia:  No  Handed:  Right  AIMS (if indicated):     Assets:  Desire for Improvement Housing Physical Health  ADL's:  Intact  Cognition:  WNL  Sleep:         Treatment Plan Summary: Plan To admit to [facility] observation unit for stabilization and treatment  Observation Level/Precautions:  15 minute checks Laboratory:  ED labs reviewed Psychotherapy:   Medications:   Consultations:   Discharge Concerns:   Estimated LOS: OtherHampton Abbot, MD 2/1/201810:18 AM

## 2016-06-22 ENCOUNTER — Other Ambulatory Visit (HOSPITAL_COMMUNITY): Payer: BLUE CROSS/BLUE SHIELD

## 2016-07-08 ENCOUNTER — Emergency Department (HOSPITAL_COMMUNITY)
Admission: EM | Admit: 2016-07-08 | Discharge: 2016-07-08 | Disposition: A | Discharge status: Home / Self Care | Payer: BLUE CROSS/BLUE SHIELD | Attending: Emergency Medicine | Admitting: Emergency Medicine

## 2016-07-08 DIAGNOSIS — R1012 Left upper quadrant pain: Secondary | ICD-10-CM | POA: Insufficient documentation

## 2016-07-08 DIAGNOSIS — J45909 Unspecified asthma, uncomplicated: Secondary | ICD-10-CM | POA: Diagnosis not present

## 2016-07-08 DIAGNOSIS — R1010 Upper abdominal pain, unspecified: Secondary | ICD-10-CM

## 2016-07-08 DIAGNOSIS — R1110 Vomiting, unspecified: Secondary | ICD-10-CM

## 2016-07-08 DIAGNOSIS — R112 Nausea with vomiting, unspecified: Secondary | ICD-10-CM | POA: Diagnosis present

## 2016-07-08 LAB — COMPREHENSIVE METABOLIC PANEL
ALT: 13 U/L — AB (ref 14–54)
ANION GAP: 8 (ref 5–15)
AST: 21 U/L (ref 15–41)
Albumin: 5 g/dL (ref 3.5–5.0)
Alkaline Phosphatase: 60 U/L (ref 38–126)
BUN: 12 mg/dL (ref 6–20)
CHLORIDE: 103 mmol/L (ref 101–111)
CO2: 27 mmol/L (ref 22–32)
CREATININE: 0.86 mg/dL (ref 0.44–1.00)
Calcium: 9.4 mg/dL (ref 8.9–10.3)
Glucose, Bld: 107 mg/dL — ABNORMAL HIGH (ref 65–99)
Potassium: 3.9 mmol/L (ref 3.5–5.1)
SODIUM: 138 mmol/L (ref 135–145)
Total Bilirubin: 0.6 mg/dL (ref 0.3–1.2)
Total Protein: 8.2 g/dL — ABNORMAL HIGH (ref 6.5–8.1)

## 2016-07-08 LAB — CBC WITH DIFFERENTIAL/PLATELET
BASOS ABS: 0 10*3/uL (ref 0.0–0.1)
Basophils Relative: 0 %
EOS ABS: 0 10*3/uL (ref 0.0–0.7)
EOS PCT: 0 %
HCT: 40.9 % (ref 36.0–46.0)
HEMOGLOBIN: 13.4 g/dL (ref 12.0–15.0)
LYMPHS ABS: 0.3 10*3/uL — AB (ref 0.7–4.0)
LYMPHS PCT: 3 %
MCH: 27.9 pg (ref 26.0–34.0)
MCHC: 32.8 g/dL (ref 30.0–36.0)
MCV: 85 fL (ref 78.0–100.0)
Monocytes Absolute: 0.2 10*3/uL (ref 0.1–1.0)
Monocytes Relative: 2 %
NEUTROS PCT: 95 %
Neutro Abs: 9.3 10*3/uL — ABNORMAL HIGH (ref 1.7–7.7)
Platelets: 269 10*3/uL (ref 150–400)
RBC: 4.81 MIL/uL (ref 3.87–5.11)
RDW: 12.9 % (ref 11.5–15.5)
WBC: 9.8 10*3/uL (ref 4.0–10.5)

## 2016-07-08 LAB — POC URINE PREG, ED: Preg Test, Ur: NEGATIVE

## 2016-07-08 LAB — LIPASE, BLOOD: LIPASE: 43 U/L (ref 11–51)

## 2016-07-08 MED ORDER — SODIUM CHLORIDE 0.9 % IV BOLUS (SEPSIS)
500.0000 mL | Freq: Once | INTRAVENOUS | Status: AC
  Administered 2016-07-08: 500 mL via INTRAVENOUS

## 2016-07-08 MED ORDER — ONDANSETRON 4 MG PO TBDP: 4.0000 mg | ORAL_TABLET | Freq: Three times a day (TID) | ORAL | 0 refills | Status: DC | PRN

## 2016-07-08 MED ORDER — ONDANSETRON HCL 4 MG/2ML IJ SOLN
4.0000 mg | Freq: Once | INTRAMUSCULAR | Status: AC
  Administered 2016-07-08: 4 mg via INTRAVENOUS
  Filled 2016-07-08: qty 2

## 2016-07-08 MED ORDER — FAMOTIDINE 20 MG PO TABS
20.0000 mg | ORAL_TABLET | Freq: Once | ORAL | Status: AC
  Administered 2016-07-08: 20 mg via ORAL
  Filled 2016-07-08: qty 1

## 2016-07-08 NOTE — ED Triage Notes (Signed)
Pt reports n/v since 1am this morning. Pt denies diarrhea and fever. Pt rates abdomen pain 4/10 and states she thinks its related to vomiting. Pt reports last menstrual cycle 2/8 to 2/12 and menstrual started again this morning. Pt reports having vomiting with last cycle. Pt also reports taking a plan B pill 2/16.

## 2016-07-08 NOTE — ED Provider Notes (Signed)
[facility]-EMERGENCY DEPT Provider Note   CSN: [id number] Arrival date & time: 07/08/16  [id number]     History   Chief Complaint No chief complaint on file.   HPI Dawn Meza is a 21 y.o. female.  HPI  21 year old female presents with vomiting since around 1 AM. Patient states that she got up out of bed and drink some juice and then shortly after started feeling lightheaded, had an onset of vaginal bleeding, and then started vomiting. She states she would get diaphoretic and lightheaded every time before she would vomit. She did not pass out. It has started making her chronic anxiety much worse. She also reports upper abdominal pain, feels like her abdomen is in a knot. Her LMP was earlier this month. This vaginal bleeding is abnormal for her. Just prior to arrival she was able to eat a banana and not vomit but she still feels nauseated and still has abdominal pain. Feels like she got over a flulike or cold illness a couple weeks ago but no other recent illness. One episode of diarrhea. No hematemesis or hematochezia. No urinary symptoms. Took a Plan B on 07/03/16.  Past Medical History:  Diagnosis Date  . Anxiety   . Asthma     Patient Active Problem List   Diagnosis Date Noted  . MDD (major depressive disorder), single episode, severe , no psychosis (HCC) 06/17/2016    Past Surgical History:  Procedure Laterality Date  . APPENDECTOMY      OB History    No data available       Home Medications    Prior to Admission medications   Medication Sig Start Date End Date Taking? Authorizing Provider  albuterol (PROVENTIL HFA;VENTOLIN HFA) 108 (90 Base) MCG/ACT inhaler Inhale 2 puffs into the lungs as needed for wheezing or shortness of breath. Patient taking differently: Inhale 2 puffs into the lungs every 4 (four) hours as needed for wheezing or shortness of breath.  06/17/16  Yes Laveda AbbeLaurie Britton Parks, NP  escitalopram (LEXAPRO) 10 MG tablet Take 1 tablet (10 mg total) by mouth daily.  06/18/16  Yes Laveda AbbeLaurie Britton Parks, NP  levonorgestrel (PLAN B,NEXT CHOICE) 0.75 MG tablet Take 0.75 mg by mouth once.   Yes Historical Provider, MD  hydrOXYzine (ATARAX/VISTARIL) 25 MG tablet Take 1 tablet (25 mg total) by mouth every 6 (six) hours as needed for anxiety. Patient not taking: Reported on 07/08/2016 06/17/16   Laveda AbbeLaurie Britton Parks, NP  ondansetron (ZOFRAN ODT) 4 MG disintegrating tablet Take 1 tablet (4 mg total) by mouth every 8 (eight) hours as needed for nausea or vomiting. 07/08/16   Pricilla LovelessScott Arlett Goold, MD    Family History No family history on file.  Social History Social History  Substance Use Topics  . Smoking status: Never Smoker  . Smokeless tobacco: Never Used  . Alcohol use No     Allergies   Patient has no known allergies.   Review of Systems Review of Systems  Constitutional: Positive for chills and diaphoresis.  Gastrointestinal: Positive for abdominal pain, diarrhea, nausea and vomiting. Negative for blood in stool.  Genitourinary: Positive for vaginal bleeding. Negative for dysuria.  All other systems reviewed and are negative.    Physical Exam Updated Vital Signs BP 118/66 (BP Location: Left Arm)   Pulse 85   Temp 97.8 F (36.6 C) (Oral)   Resp 16   Ht 5' (1.524 m)   Wt 87 lb (39.5 kg)   SpO2 99%   BMI 16.99 kg/m  Physical Exam  Constitutional: She is oriented to person, place, and time. She appears well-developed and well-nourished. No distress.  HENT:  Head: Normocephalic and atraumatic.  Right Ear: External ear normal.  Left Ear: External ear normal.  Nose: Nose normal.  Eyes: Right eye exhibits no discharge. Left eye exhibits no discharge.  Cardiovascular: Normal rate, regular rhythm and normal heart sounds.   Pulmonary/Chest: Effort normal and breath sounds normal.  Abdominal: Soft. There is tenderness in the epigastric area and left upper quadrant.  Neurological: She is alert and oriented to person, place, and time.  Skin: Skin is  warm and dry. She is not diaphoretic.  Nursing note and vitals reviewed.    ED Treatments / Results  Labs (all labs ordered are listed, but only abnormal results are displayed) Labs Reviewed  COMPREHENSIVE METABOLIC PANEL - Abnormal; Notable for the following:       Result Value   Glucose, Bld 107 (*)    Total Protein 8.2 (*)    ALT 13 (*)    All other components within normal limits  CBC WITH DIFFERENTIAL/PLATELET - Abnormal; Notable for the following:    Neutro Abs 9.3 (*)    Lymphs Abs 0.3 (*)    All other components within normal limits  LIPASE, BLOOD  POC URINE PREG, ED    EKG  EKG Interpretation None       Radiology No results found.  Procedures Procedures (including critical care time)  Medications Ordered in ED Medications  ondansetron (ZOFRAN) injection 4 mg (4 mg Intravenous Given 07/08/16 0953)  sodium chloride 0.9 % bolus 500 mL (500 mLs Intravenous New Bag/Given 07/08/16 0953)  famotidine (PEPCID) tablet 20 mg (20 mg Oral Given 07/08/16 0953)     Initial Impression / Assessment and Plan / ED Course  I have reviewed the triage vital signs and the nursing notes.  Pertinent labs & imaging results that were available during my care of the patient were reviewed by me and considered in my medical decision making (see chart for details).  Clinical Course as of Jul 08 1141  Wed Jul 08, 2016  1610 Most likely has viral GI illness. Given onset of vaginal bleeding will also r/o pregnancy. No lower abd tenderness, only epigastric/LUQ, likely from vomiting/gastritis.  [SG]    Clinical Course User Index [SG] Pricilla Loveless, MD    No vomiting in the ED. Abdominal pain is significantly improved. No lower abdominal tenderness. I do not think imaging is warranted at this time as this is most likely a viral GI illness. She is not pregnant. No urinary symptoms. Plan to discharge home with symptomatically care, fluids. Strict return precautions.  Final Clinical  Impressions(s) / ED Diagnoses   Final diagnoses:  Vomiting in adult  Upper abdominal pain    New Prescriptions New Prescriptions   ONDANSETRON (ZOFRAN ODT) 4 MG DISINTEGRATING TABLET    Take 1 tablet (4 mg total) by mouth every 8 (eight) hours as needed for nausea or vomiting.     Pricilla Loveless, MD 07/08/16 [phone]

## 2016-10-05 ENCOUNTER — Encounter (HOSPITAL_COMMUNITY): Payer: Self-pay | Admitting: Emergency Medicine

## 2016-10-05 ENCOUNTER — Emergency Department (HOSPITAL_COMMUNITY)
Admission: EM | Admit: 2016-10-05 | Discharge: 2016-10-06 | Disposition: A | Discharge status: Home / Self Care | Payer: BLUE CROSS/BLUE SHIELD | Attending: Emergency Medicine | Admitting: Emergency Medicine

## 2016-10-05 DIAGNOSIS — Z79899 Other long term (current) drug therapy: Secondary | ICD-10-CM | POA: Insufficient documentation

## 2016-10-05 DIAGNOSIS — R197 Diarrhea, unspecified: Secondary | ICD-10-CM | POA: Diagnosis not present

## 2016-10-05 DIAGNOSIS — T7421XA Adult sexual abuse, confirmed, initial encounter: Secondary | ICD-10-CM | POA: Insufficient documentation

## 2016-10-05 DIAGNOSIS — Z202 Contact with and (suspected) exposure to infections with a predominantly sexual mode of transmission: Secondary | ICD-10-CM

## 2016-10-05 DIAGNOSIS — J45909 Unspecified asthma, uncomplicated: Secondary | ICD-10-CM | POA: Insufficient documentation

## 2016-10-05 LAB — WET PREP, GENITAL
Sperm: NONE SEEN
TRICH WET PREP: NONE SEEN
YEAST WET PREP: NONE SEEN

## 2016-10-05 LAB — COMPREHENSIVE METABOLIC PANEL
ALT: 12 U/L — ABNORMAL LOW (ref 14–54)
ANION GAP: 6 (ref 5–15)
AST: 19 U/L (ref 15–41)
Albumin: 4.9 g/dL (ref 3.5–5.0)
Alkaline Phosphatase: 59 U/L (ref 38–126)
BILIRUBIN TOTAL: 0.6 mg/dL (ref 0.3–1.2)
BUN: 9 mg/dL (ref 6–20)
CO2: 28 mmol/L (ref 22–32)
Calcium: 9.4 mg/dL (ref 8.9–10.3)
Chloride: 104 mmol/L (ref 101–111)
Creatinine, Ser: 0.84 mg/dL (ref 0.44–1.00)
Glucose, Bld: 58 mg/dL — ABNORMAL LOW (ref 65–99)
POTASSIUM: 4.1 mmol/L (ref 3.5–5.1)
Sodium: 138 mmol/L (ref 135–145)
TOTAL PROTEIN: 7.3 g/dL (ref 6.5–8.1)

## 2016-10-05 LAB — URINALYSIS, ROUTINE W REFLEX MICROSCOPIC
Bilirubin Urine: NEGATIVE
GLUCOSE, UA: NEGATIVE mg/dL
HGB URINE DIPSTICK: NEGATIVE
Ketones, ur: NEGATIVE mg/dL
NITRITE: POSITIVE — AB
Protein, ur: NEGATIVE mg/dL
Specific Gravity, Urine: 1.015 (ref 1.005–1.030)
pH: 8 (ref 5.0–8.0)

## 2016-10-05 LAB — CBC
HEMATOCRIT: 38.8 % (ref 36.0–46.0)
HEMOGLOBIN: 12.5 g/dL (ref 12.0–15.0)
MCH: 28.2 pg (ref 26.0–34.0)
MCHC: 32.2 g/dL (ref 30.0–36.0)
MCV: 87.6 fL (ref 78.0–100.0)
Platelets: 323 10*3/uL (ref 150–400)
RBC: 4.43 MIL/uL (ref 3.87–5.11)
RDW: 13.3 % (ref 11.5–15.5)
WBC: 5.2 10*3/uL (ref 4.0–10.5)

## 2016-10-05 LAB — CBG MONITORING, ED: GLUCOSE-CAPILLARY: 106 mg/dL — AB (ref 65–99)

## 2016-10-05 LAB — POC URINE PREG, ED: PREG TEST UR: NEGATIVE

## 2016-10-05 MED ORDER — LIDOCAINE HCL (PF) 1 % IJ SOLN
INTRAMUSCULAR | Status: AC
  Filled 2016-10-05: qty 5

## 2016-10-05 MED ORDER — CEFTRIAXONE SODIUM 250 MG IJ SOLR
250.0000 mg | Freq: Once | INTRAMUSCULAR | Status: AC
  Administered 2016-10-06: 250 mg via INTRAMUSCULAR
  Filled 2016-10-05: qty 250

## 2016-10-05 MED ORDER — NON FORMULARY: 1.5000 mg | Freq: Once | Status: DC

## 2016-10-05 MED ORDER — AZITHROMYCIN 250 MG PO TABS
1000.0000 mg | ORAL_TABLET | Freq: Once | ORAL | Status: AC
  Administered 2016-10-05: 1000 mg via ORAL
  Filled 2016-10-05: qty 4

## 2016-10-05 MED ORDER — ULIPRISTAL ACETATE 30 MG PO TABS
30.0000 mg | ORAL_TABLET | Freq: Once | ORAL | Status: AC
  Administered 2016-10-06: 30 mg via ORAL
  Filled 2016-10-05: qty 1
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
## 2016-10-05 NOTE — ED Triage Notes (Signed)
Called x's 3 without response 

## 2016-10-05 NOTE — ED Notes (Signed)
Gave pt sandwich b/c she stated she has "a weak" stomach for antibiotics.  Pt has been here since 3pm and not eaten.

## 2016-10-06 ENCOUNTER — Emergency Department (HOSPITAL_COMMUNITY)
Admission: EM | Admit: 2016-10-06 | Discharge: 2016-10-06 | Disposition: A | Discharge status: Home / Self Care | Payer: BLUE CROSS/BLUE SHIELD | Attending: Emergency Medicine | Admitting: Emergency Medicine

## 2016-10-06 ENCOUNTER — Encounter (HOSPITAL_COMMUNITY): Payer: Self-pay | Admitting: *Deleted

## 2016-10-06 DIAGNOSIS — R197 Diarrhea, unspecified: Secondary | ICD-10-CM | POA: Diagnosis present

## 2016-10-06 DIAGNOSIS — J45909 Unspecified asthma, uncomplicated: Secondary | ICD-10-CM | POA: Diagnosis not present

## 2016-10-06 DIAGNOSIS — R1110 Vomiting, unspecified: Secondary | ICD-10-CM | POA: Insufficient documentation

## 2016-10-06 DIAGNOSIS — Z79899 Other long term (current) drug therapy: Secondary | ICD-10-CM | POA: Insufficient documentation

## 2016-10-06 LAB — HIV ANTIBODY (ROUTINE TESTING W REFLEX): HIV SCREEN 4TH GENERATION: NONREACTIVE

## 2016-10-06 LAB — RPR: RPR: NONREACTIVE

## 2016-10-06 LAB — GC/CHLAMYDIA PROBE AMP (~~LOC~~) NOT AT ARMC
Chlamydia: NEGATIVE
Neisseria Gonorrhea: NEGATIVE

## 2016-10-06 MED ORDER — ONDANSETRON 4 MG PO TBDP
8.0000 mg | ORAL_TABLET | Freq: Once | ORAL | Status: AC
  Administered 2016-10-06: 8 mg via ORAL
  Filled 2016-10-06: qty 2

## 2016-10-06 MED ORDER — CEPHALEXIN 500 MG PO CAPS: 500.0000 mg | ORAL_CAPSULE | Freq: Four times a day (QID) | ORAL | 0 refills | Status: DC
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
## 2016-11-04 ENCOUNTER — Emergency Department: Payer: BLUE CROSS/BLUE SHIELD

## 2016-11-04 ENCOUNTER — Emergency Department
Admission: EM | Admit: 2016-11-04 | Discharge: 2016-11-04 | Disposition: A | Discharge status: Home / Self Care | Payer: BLUE CROSS/BLUE SHIELD | Attending: Emergency Medicine | Admitting: Emergency Medicine

## 2016-11-04 DIAGNOSIS — W2211XA Striking against or struck by driver side automobile airbag, initial encounter: Secondary | ICD-10-CM | POA: Diagnosis not present

## 2016-11-04 DIAGNOSIS — J45909 Unspecified asthma, uncomplicated: Secondary | ICD-10-CM | POA: Insufficient documentation

## 2016-11-04 DIAGNOSIS — Y9289 Other specified places as the place of occurrence of the external cause: Secondary | ICD-10-CM | POA: Diagnosis not present

## 2016-11-04 DIAGNOSIS — S069X9A Unspecified intracranial injury with loss of consciousness of unspecified duration, initial encounter: Secondary | ICD-10-CM | POA: Diagnosis present

## 2016-11-04 DIAGNOSIS — S161XXA Strain of muscle, fascia and tendon at neck level, initial encounter: Secondary | ICD-10-CM | POA: Diagnosis not present

## 2016-11-04 DIAGNOSIS — M25571 Pain in right ankle and joints of right foot: Secondary | ICD-10-CM | POA: Diagnosis not present

## 2016-11-04 DIAGNOSIS — Y9389 Activity, other specified: Secondary | ICD-10-CM | POA: Insufficient documentation

## 2016-11-04 DIAGNOSIS — M7918 Myalgia, other site: Secondary | ICD-10-CM

## 2016-11-04 DIAGNOSIS — Y998 Other external cause status: Secondary | ICD-10-CM | POA: Diagnosis not present

## 2016-11-04 DIAGNOSIS — M25562 Pain in left knee: Secondary | ICD-10-CM | POA: Insufficient documentation

## 2016-11-04 MED ORDER — IBUPROFEN 600 MG PO TABS: 600.0000 mg | ORAL_TABLET | Freq: Three times a day (TID) | ORAL | 0 refills | Status: DC | PRN

## 2016-11-04 MED ORDER — CYCLOBENZAPRINE HCL 10 MG PO TABS
10.0000 mg | ORAL_TABLET | Freq: Once | ORAL | Status: AC
  Administered 2016-11-04: 10 mg via ORAL
  Filled 2016-11-04: qty 1

## 2016-11-04 MED ORDER — CYCLOBENZAPRINE HCL 10 MG PO TABS: 10.0000 mg | ORAL_TABLET | Freq: Three times a day (TID) | ORAL | 0 refills | Status: DC | PRN

## 2016-11-04 MED ORDER — OXYCODONE-ACETAMINOPHEN 5-325 MG PO TABS
1.0000 | ORAL_TABLET | Freq: Once | ORAL | Status: AC
  Administered 2016-11-04: 1 via ORAL
  Filled 2016-11-04: qty 1

## 2016-11-04 MED ORDER — OXYCODONE-ACETAMINOPHEN 5-325 MG PO TABS: 1.0000 | ORAL_TABLET | Freq: Four times a day (QID) | ORAL | 0 refills | Status: DC | PRN

## 2016-11-04 MED ORDER — IBUPROFEN 600 MG PO TABS
600.0000 mg | ORAL_TABLET | Freq: Once | ORAL | Status: AC
  Administered 2016-11-04: 600 mg via ORAL
  Filled 2016-11-04: qty 1

## 2016-11-04 NOTE — ED Notes (Signed)
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
## 2016-11-04 NOTE — ED Notes (Signed)
Pt has C-collar in place at this time.

## 2017-08-09 ENCOUNTER — Emergency Department
Admission: EM | Admit: 2017-08-09 | Discharge: 2017-08-10 | Disposition: A | Discharge status: Home / Self Care | Payer: BLUE CROSS/BLUE SHIELD | Attending: Emergency Medicine | Admitting: Emergency Medicine

## 2017-08-09 ENCOUNTER — Other Ambulatory Visit: Payer: Self-pay

## 2017-08-09 ENCOUNTER — Encounter: Payer: Self-pay | Admitting: Emergency Medicine

## 2017-08-09 DIAGNOSIS — J45909 Unspecified asthma, uncomplicated: Secondary | ICD-10-CM | POA: Diagnosis not present

## 2017-08-09 DIAGNOSIS — F4323 Adjustment disorder with mixed anxiety and depressed mood: Secondary | ICD-10-CM | POA: Diagnosis present

## 2017-08-09 DIAGNOSIS — R45851 Suicidal ideations: Secondary | ICD-10-CM

## 2017-08-09 DIAGNOSIS — F419 Anxiety disorder, unspecified: Secondary | ICD-10-CM | POA: Insufficient documentation

## 2017-08-09 DIAGNOSIS — F329 Major depressive disorder, single episode, unspecified: Secondary | ICD-10-CM | POA: Diagnosis not present

## 2017-08-09 HISTORY — DX: Depression, unspecified: F32.A

## 2017-08-09 HISTORY — DX: Major depressive disorder, single episode, unspecified: F32.9

## 2017-08-09 HISTORY — DX: Suicide attempt, initial encounter: T14.91XA

## 2017-08-09 LAB — WET PREP, GENITAL
CLUE CELLS WET PREP: NONE SEEN
Sperm: NONE SEEN
Trich, Wet Prep: NONE SEEN
Yeast Wet Prep HPF POC: NONE SEEN

## 2017-08-09 LAB — COMPREHENSIVE METABOLIC PANEL
ALBUMIN: 4.7 g/dL (ref 3.5–5.0)
ALK PHOS: 76 U/L (ref 38–126)
ALT: 18 U/L (ref 14–54)
AST: 22 U/L (ref 15–41)
Anion gap: 7 (ref 5–15)
BUN: 10 mg/dL (ref 6–20)
CALCIUM: 8.9 mg/dL (ref 8.9–10.3)
CO2: 25 mmol/L (ref 22–32)
Chloride: 104 mmol/L (ref 101–111)
Creatinine, Ser: 0.83 mg/dL (ref 0.44–1.00)
GFR calc Af Amer: >60 mL/min (ref >60)
GFR calc non Af Amer: >60 mL/min (ref >60)
GLUCOSE: 94 mg/dL (ref 65–99)
Potassium: 3.8 mmol/L (ref 3.5–5.1)
SODIUM: 136 mmol/L (ref 135–145)
TOTAL PROTEIN: 7.9 g/dL (ref 6.5–8.1)
Total Bilirubin: 0.4 mg/dL (ref 0.3–1.2)

## 2017-08-09 LAB — CBC
HEMATOCRIT: 40.1 % (ref 35.0–47.0)
HEMOGLOBIN: 13.1 g/dL (ref 12.0–16.0)
MCH: 28.2 pg (ref 26.0–34.0)
MCHC: 32.6 g/dL (ref 32.0–36.0)
MCV: 86.5 fL (ref 80.0–100.0)
Platelets: 309 10*3/uL (ref 150–440)
RBC: 4.64 MIL/uL (ref 3.80–5.20)
RDW: 13.2 % (ref 11.5–14.5)
WBC: 6.9 10*3/uL (ref 3.6–11.0)

## 2017-08-09 LAB — SALICYLATE LEVEL: Salicylate Lvl: <7 mg/dL (ref 2.8–30.0)

## 2017-08-09 LAB — PREGNANCY, URINE: Preg Test, Ur: NEGATIVE

## 2017-08-09 LAB — URINE DRUG SCREEN, QUALITATIVE (ARMC ONLY)
Amphetamines, Ur Screen: NOT DETECTED
BENZODIAZEPINE, UR SCRN: NOT DETECTED
Barbiturates, Ur Screen: NOT DETECTED
COCAINE METABOLITE, UR ~~LOC~~: NOT DETECTED
Cannabinoid 50 Ng, Ur ~~LOC~~: POSITIVE — AB
MDMA (Ecstasy)Ur Screen: NOT DETECTED
METHADONE SCREEN, URINE: NOT DETECTED
OPIATE, UR SCREEN: NOT DETECTED
PHENCYCLIDINE (PCP) UR S: NOT DETECTED
Tricyclic, Ur Screen: NOT DETECTED

## 2017-08-09 LAB — ETHANOL: Alcohol, Ethyl (B): <10 mg/dL (ref <10)

## 2017-08-09 LAB — CHLAMYDIA/NGC RT PCR (ARMC ONLY)
CHLAMYDIA TR: NOT DETECTED
N GONORRHOEAE: NOT DETECTED

## 2017-08-09 LAB — ACETAMINOPHEN LEVEL

## 2017-08-09 MED ORDER — ALBUTEROL SULFATE HFA 108 (90 BASE) MCG/ACT IN AERS
2.0000 | INHALATION_SPRAY | RESPIRATORY_TRACT | Status: DC
  Administered 2017-08-09: 2 via RESPIRATORY_TRACT
  Filled 2017-08-09: qty 6.7

## 2017-08-09 MED ORDER — LORAZEPAM 2 MG/ML IJ SOLN
1.0000 mg | Freq: Once | INTRAMUSCULAR | Status: AC
  Administered 2017-08-09: 1 mg via INTRAMUSCULAR
  Filled 2017-08-09: qty 1

## 2017-08-09 MED ORDER — NICOTINE 14 MG/24HR TD PT24
14.0000 mg | MEDICATED_PATCH | Freq: Every day | TRANSDERMAL | Status: DC
  Administered 2017-08-10: 14 mg via TRANSDERMAL
  Filled 2017-08-09: qty 1

## 2017-08-09 NOTE — ED Notes (Signed)
Pt. Agitated, screaming and crying ref. Her IVC.
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
## 2017-08-09 NOTE — ED Notes (Signed)
Pt. Screaming and crying demanding to be discharged. IVC explained to no avail.

## 2017-08-09 NOTE — ED Notes (Signed)
Hourly rounding reveals patient in room. No complaints, stable, in no acute distress. Q15 minute rounds and monitoring via Security Cameras to continue. 

## 2017-08-09 NOTE — ED Notes (Signed)
Hourly rounding reveals patient sleeping in room. No complaints, stable, in no acute distress. Q15 minute rounds and monitoring via Security Cameras to continue. 

## 2017-08-09 NOTE — ED Notes (Signed)
Salome SpottedDad, John Sonnenfeld [phone].
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
## 2017-08-09 NOTE — ED Notes (Signed)
Hourly rounding reveals patient in room.  Q15 minute rounds and monitoring via [employer] Cam[employer] to continue.

## 2017-08-09 NOTE — ED Notes (Signed)
IVC/ INFORMED  RN  AMY  TEAGUE  AND ODS OFFICER  BALDWIN

## 2017-08-09 NOTE — ED Notes (Signed)
BEHAVIORAL HEALTH ROUNDING Patient sleeping: No. Patient alert and oriented: yes Behavior appropriate: Yes.  ; If no, describe:  Nutrition and fluids offered: yes Toileting and hygiene offered: Yes  Sitter present: q15 minute observations and security monitoring Law enforcement present: Yes    

## 2017-08-09 NOTE — ED Notes (Signed)
Snack and beverage given. 

## 2017-08-09 NOTE — ED Notes (Signed)

## 2017-08-09 NOTE — ED Notes (Signed)
Pt. To rest room crying loudly.

## 2017-08-09 NOTE — ED Notes (Signed)
PT IVC/SOC COMPLETE/RECOMMENDS PLACEMENT/MOVED TO BHU 4. 

## 2017-08-09 NOTE — ED Notes (Signed)
Report to include Situation, Background, Assessment, and [facility]ations [facility]. Patient alert and oriented, warm and dry, in no acute distress. Patient denies SI, HI, AVH and pain although tearful at this time, stating "I want to go home". Patient made aware of Q15 minute rounds and security cameras for their safety. Patient instructed to come to me with needs or concerns.

## 2017-08-09 NOTE — ED Notes (Signed)
Pt. Crying loudly, demanding to be released. IVC explained.
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
## 2017-08-09 NOTE — ED Triage Notes (Addendum)
Pt to ed with c/o depression and suicidal thoughts and anxiety.  Pt states she does have current thoughts of harming herself but does not have a specific plan.  +SI, denies HI, denies hallucinations. Hx of suicide attempt.  Spoke with Dr Don PerkingVeronese and she states she is going to fill out IVC papers for patient.

## 2017-08-10 ENCOUNTER — Inpatient Hospital Stay: Admission: AD | Admit: 2017-08-10 | Payer: BLUE CROSS/BLUE SHIELD | Source: Intra-hospital | Admitting: Psychiatry

## 2017-08-10 ENCOUNTER — Encounter: Payer: Self-pay | Admitting: Psychiatry

## 2017-08-10 DIAGNOSIS — F4323 Adjustment disorder with mixed anxiety and depressed mood: Secondary | ICD-10-CM

## 2017-08-10 MED ORDER — ESCITALOPRAM OXALATE 10 MG PO TABS: 10.0000 mg | ORAL_TABLET | Freq: Every day | ORAL | 1 refills | Status: DC

## 2017-08-10 MED ORDER — ESCITALOPRAM OXALATE 10 MG PO TABS
10.0000 mg | ORAL_TABLET | Freq: Every day | ORAL | Status: DC
  Administered 2017-08-10: 10 mg via ORAL
  Filled 2017-08-10: qty 1

## 2017-08-10 MED ORDER — OXCARBAZEPINE 300 MG PO TABS: 300.0000 mg | ORAL_TABLET | Freq: Two times a day (BID) | ORAL | 1 refills | Status: DC

## 2017-08-10 MED ORDER — OXCARBAZEPINE 300 MG PO TABS
300.0000 mg | ORAL_TABLET | Freq: Two times a day (BID) | ORAL | Status: DC
  Filled 2017-08-10 (×3): qty 1

## 2017-08-10 NOTE — ED Notes (Signed)
Hourly rounding reveals patient sleeping in room. No complaints, stable, in no acute distress. Q15 minute rounds and monitoring via Security Cameras to continue. 

## 2017-08-10 NOTE — ED Provider Notes (Signed)
Dr. Jennet MaduroPucilowska has seen the patient. She called me and says she feels the patient is okay to go home she will reverse the patient's commitment and do theprescriptions.   Arnaldo NatalMalinda, Dilia Alemany F, MD 08/10/17 1444

## 2017-08-10 NOTE — ED Provider Notes (Signed)
-----------------------------------------   5:59 AM on 08/10/2017 -----------------------------------------   Blood pressure 114/78, pulse (!) 51, temperature 98.5 F (36.9 C), temperature source Oral, resp. rate 16, height 5' (1.524 m), weight 38.6 kg (85 lb), last menstrual period 07/19/2017, SpO2 100 %.  The patient had no acute events since last update.  Calm and cooperative at this time.  Disposition is pending Psychiatry/Behavioral Medicine team recommendations.     Merrily Brittleifenbark, Noble Bodie, MD 08/10/17 [phone]

## 2017-08-10 NOTE — ED Notes (Signed)
Pt denies SI/HI/AVH. Pt given discharge instructions including prescriptions. Pt states understanding. Pt states receipt of all belongings.   

## 2017-08-10 NOTE — Discharge Instructions (Addendum)
please continue your medicines. Please follow-up with your doctor or see [facility]. Please return here for any further problems

## 2017-08-10 NOTE — Consult Note (Signed)
Dawn Meza came to the ER complaining of suicidal ideation in the face of severe social stressors. She is no longer suicidal.  Full consult to follow.   PLAN:  1. I will terminate proceedings. Please discharge as appropriate.  2. Continue Lexapro 10 mg daily for depression/anxiety.  3. Start Trileptal 300 mg BID for mood stabilization.  4. Follow up with mental health provider for medication management and therapy.
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
## 2017-08-10 NOTE — BH Assessment (Signed)
[facility]. [facility] by Dr. Jennet MaduroPucilowska.  Attending Physician will be Dr. Jennet MaduroPucilowska.   Patient has been [facility] to room [facility]B, [facility] Nurse [place] Park[place]is.   Intake Paper Work has been signed and placed on patient chart.  ER staff is aware of the admission:  Glenda, ER Sectary   Dr. Darnelle CatalanMalinda, ER MD   Everardo PacificKenisha, Patient's Nurse   Ivin BootyJoshua, Patient Access.

## 2017-08-10 NOTE — ED Notes (Signed)
Writer spoke with patient's stepfather concerning visitation, holding process, and potential admission.

## 2017-08-10 NOTE — ED Notes (Signed)
Patient on telephone screaming and crying.  Advised patient that call would be terminated if she did not calm down.

## 2017-08-10 NOTE — ED Notes (Signed)
Dr Jennet MaduroPucilowska at bedside.

## 2017-08-10 NOTE — ED Notes (Signed)
IVC rescinded/ Pt to be D/C'd 

## 2017-08-10 NOTE — ED Notes (Signed)
Patient calm and cooperative.  Patient concerned about missing school.

## 2017-08-10 NOTE — ED Notes (Signed)
Family at bedside. 

## 2017-08-10 NOTE — Clinical Social Work Note (Signed)
CSW received consult for "[id number]." Call from RN Everardo PacificKenisha stating patient needs outpatient mental health resources. CSW to provide [place] with [place] [facility] [place]. CSW signing off as no further Social Work needs identified.   Dawn HoveJeneya Denham Meza, Theresia MajorsLCSWA, [facility] Clinical Social Worker-ED [phone]

## 2017-08-10 NOTE — ED Notes (Signed)
Patient on phone with mother and step father.

## 2017-08-27 ENCOUNTER — Encounter: Payer: Self-pay | Admitting: Advanced Practice Midwife

## 2017-08-27 ENCOUNTER — Ambulatory Visit (INDEPENDENT_AMBULATORY_CARE_PROVIDER_SITE_OTHER): Payer: BLUE CROSS/BLUE SHIELD | Admitting: Advanced Practice Midwife

## 2017-08-27 VITALS — BP 102/70 | Wt 88.0 lb

## 2017-08-27 DIAGNOSIS — Z9151 Personal history of suicidal behavior: Secondary | ICD-10-CM | POA: Insufficient documentation

## 2017-08-27 DIAGNOSIS — F329 Major depressive disorder, single episode, unspecified: Secondary | ICD-10-CM

## 2017-08-27 DIAGNOSIS — F32A Depression, unspecified: Secondary | ICD-10-CM

## 2017-08-27 DIAGNOSIS — Z113 Encounter for screening for infections with a predominantly sexual mode of transmission: Secondary | ICD-10-CM

## 2017-08-27 DIAGNOSIS — O99340 Other mental disorders complicating pregnancy, unspecified trimester: Secondary | ICD-10-CM

## 2017-08-27 DIAGNOSIS — O0990 Supervision of high risk pregnancy, unspecified, unspecified trimester: Secondary | ICD-10-CM

## 2017-08-27 DIAGNOSIS — Z915 Personal history of self-harm: Secondary | ICD-10-CM

## 2017-08-27 MED ORDER — SERTRALINE HCL 50 MG PO TABS: 50.0000 mg | ORAL_TABLET | Freq: Every day | ORAL | 2 refills | Status: DC

## 2017-08-27 NOTE — Patient Instructions (Signed)
Prenatal Care WHAT IS PRENATAL CARE? Prenatal care is the process of caring for a pregnant woman before she gives birth. Prenatal care makes sure that she and her baby remain as healthy as possible throughout pregnancy. Prenatal care may be provided by a midwife, family practice health care provider, or a childbirth and pregnancy specialist (obstetrician). Prenatal care may include physical examinations, testing, treatments, and education on nutrition, lifestyle, and social support services. WHY IS PRENATAL CARE SO IMPORTANT? Early and consistent prenatal care increases the chance that you and your baby will remain healthy throughout your pregnancy. This type of care also decreases a baby's risk of being born too early (prematurely), or being born smaller than expected (small for gestational age). Any underlying medical conditions you may have that could pose a risk during your pregnancy are discussed during prenatal care visits. You will also be monitored regularly for any new conditions that may arise during your pregnancy so they can be treated quickly and effectively. WHAT HAPPENS DURING PRENATAL CARE VISITS? Prenatal care visits may include the following: Discussion Tell your health care provider about any new signs or symptoms you have experienced since your last visit. These might include:  Nausea or vomiting.  Increased or decreased level of energy.  Difficulty sleeping.  Back or leg pain.  Weight changes.  Frequent urination.  Shortness of breath with physical activity.  Changes in your skin, such as the development of a rash or itchiness.  Vaginal discharge or bleeding.  Feelings of excitement or nervousness.  Changes in your baby's movements.  You may want to write down any questions or topics you want to discuss with your health care provider and bring them with you to your appointment. Examination During your first prenatal care visit, you will likely have a complete  physical exam. Your health care provider will often examine your vagina, cervix, and the position of your uterus, as well as check your heart, lungs, and other body systems. As your pregnancy progresses, your health care provider will measure the size of your uterus and your baby's position inside your uterus. He or she may also examine you for early signs of labor. Your prenatal visits may also include checking your blood pressure and, after about 10-12 weeks of pregnancy, listening to your baby's heartbeat. Testing Regular testing often includes:  Urinalysis. This checks your urine for glucose, protein, or signs of infection.  Blood count. This checks the levels of white and red blood cells in your body.  Tests for sexually transmitted infections (STIs). Testing for STIs at the beginning of pregnancy is routinely done and is required in many states.  Antibody testing. You will be checked to see if you are immune to certain illnesses, such as rubella, that can affect a developing fetus.  Glucose screen. Around 24-28 weeks of pregnancy, your blood glucose level will be checked for signs of gestational diabetes. Follow-up tests may be recommended.  Group B strep. This is a bacteria that is commonly found inside a woman's vagina. This test will inform your health care provider if you need an antibiotic to reduce the amount of this bacteria in your body prior to labor and childbirth.  Ultrasound. Many pregnant women undergo an ultrasound screening around 18-20 weeks of pregnancy to evaluate the health of the fetus and check for any developmental abnormalities.  HIV (human immunodeficiency virus) testing. Early in your pregnancy, you will be screened for HIV. If you are at high risk for HIV, this test may   be repeated during your third trimester of pregnancy.  You may be offered other testing based on your age, personal or family medical history, or other factors. HOW OFTEN SHOULD I PLAN TO SEE MY  HEALTH CARE PROVIDER FOR PRENATAL CARE? Your prenatal care check-up schedule depends on any medical conditions you have before, or develop during, your pregnancy. If you do not have any underlying medical conditions, you will likely be seen for checkups:  Monthly, during the first 6 months of pregnancy.  Twice a month during months 7 and 8 of pregnancy.  Weekly starting in the 9th month of pregnancy and until delivery.  If you develop signs of early labor or other concerning signs or symptoms, you may need to see your health care provider more often. Ask your health care provider what prenatal care schedule is best for you. WHAT CAN I DO TO KEEP MYSELF AND MY BABY AS HEALTHY AS POSSIBLE DURING MY PREGNANCY?  Take a prenatal vitamin containing 400 micrograms (0.4 mg) of folic acid every day. Your health care provider may also ask you to take additional vitamins such as iodine, vitamin D, iron, copper, and zinc.  Take 1500-2000 mg of calcium daily starting at your 20th week of pregnancy until you deliver your baby.  Make sure you are up to date on your vaccinations. Unless directed otherwise by your health care provider: ? You should receive a tetanus, diphtheria, and pertussis (Tdap) vaccination between the 27th and 36th week of your pregnancy, regardless of when your last Tdap immunization occurred. This helps protect your baby from whooping cough (pertussis) after he or she is born. ? You should receive an annual inactivated influenza vaccine (IIV) to help protect you and your baby from influenza. This can be done at any point during your pregnancy.  Eat a well-rounded diet that includes: ? Fresh fruits and vegetables. ? Lean proteins. ? Calcium-rich foods such as milk, yogurt, hard cheeses, and dark, leafy greens. ? Whole grain breads.  Do noteat seafood high in mercury, including: ? Swordfish. ? Tilefish. ? Shark. ? King mackerel. ? More than 6 oz tuna per week.  Do not  eat: ? Raw or undercooked meats or eggs. ? Unpasteurized foods, such as soft cheeses (brie, blue, or feta), juices, and milks. ? Lunch meats. ? Hot dogs that have not been heated until they are steaming.  Drink enough water to keep your urine clear or pale yellow. For many women, this may be 10 or more 8 oz glasses of water each day. Keeping yourself hydrated helps deliver nutrients to your baby and may prevent the start of pre-term uterine contractions.  Do not use any tobacco products including cigarettes, chewing tobacco, or electronic cigarettes. If you need help quitting, ask your health care provider.  Do not drink beverages containing alcohol. No safe level of alcohol consumption during pregnancy has been determined.  Do not use any illegal drugs. These can harm your developing baby or cause a miscarriage.  Ask your health care provider or pharmacist before taking any prescription or over-the-counter medicines, herbs, or supplements.  Limit your caffeine intake to no more than 200 mg per day.  Exercise. Unless told otherwise by your health care provider, try to get 30 minutes of moderate exercise most days of the week. Do not  do high-impact activities, contact sports, or activities with a high risk of falling, such as horseback riding or downhill skiing.  Get plenty of rest.  Avoid anything that raises your  body temperature, such as hot tubs and saunas.  If you own a cat, do not empty its litter box. Bacteria contained in cat feces can cause an infection called toxoplasmosis. This can result in serious harm to the fetus.  Stay away from chemicals such as insecticides, lead, mercury, and cleaning or paint products that contain solvents.  Do not have any X-rays taken unless medically necessary.  Take a childbirth and breastfeeding preparation class. Ask your health care provider if you need a referral or recommendation.  This information is not intended to replace advice given  to you by your health care provider. Make sure you discuss any questions you have with your health care provider. Document Released: 05/07/2003 Document Revised: 10/07/2015 Document Reviewed: 07/19/2013 Elsevier Interactive Patient Education  2017 [employer] Exercise During Pregnancy For people of all ages, exercise is an important part of being healthy. Exercise improves heart and lung function and helps to maintain strength, flexibility, and a healthy body weight. Exercise also boosts energy levels and elevates mood. For most women, maintaining an exercise routine throughout pregnancy is recommended. It is only on rare occasions and with certain medical conditions or pregnancy complications that women may be asked to limit or avoid exercise during pregnancy. What are some other benefits to exercising during pregnancy? Along with maintaining strength and flexibility, exercising throughout pregnancy can help to:  Keep strength in muscles that are very important during labor and childbirth.  Decrease low back pain during pregnancy.  Decrease the risk of developing gestational diabetes mellitus (GDM).  Improve blood sugar (glucose) control for women who have GDM.  Decrease the risk of developing preeclampsia. This is a serious condition that causes high blood pressure along with other symptoms, such as swelling and headaches.  Decrease the risk of cesarean delivery.  Speed up the recovery after giving birth.  How often should I exercise? Unless your health care provider gives you different instructions, you should try to exercise on most days or all days of the week. In general, try to exercise with moderate intensity for about 150 minutes per week. This can be spread out across several days, such as exercising for 30 minutes per day on 5 days of each week. You can tell that you are exercising at a moderate intensity if you have a higher heart rate and faster breathing, but you are still  able to hold a conversation. What types of moderate-intensity exercise are recommended during pregnancy? There are many types of exercise that are safe for you to do during pregnancy. Unless your health care provider gives you different instructions, do a variety of exercises that safely increase your heart and breathing (cardiopulmonary) rates and help you to build and maintain muscle strength (strength training). You should always be able to talk in full sentences while exercising during pregnancy. Some examples of exercising that is safe to do during pregnancy include:  Brisk walking or hiking.  Swimming.  Water aerobics.  Riding a stationary bike.  Strength training.  Modified yoga or Pilates. Tell your instructor that you are pregnant. Avoid overstretching and avoid lying on your back for long periods of time.  Running or jogging. Only choose this type of exercise if: ? You ran or jogged regularly before your pregnancy. ? You can run or jog and still talk in complete sentences.  What types of exercise should I not do during pregnancy? Depending on your level of fitness and whether you exercised regularly before your pregnancy, you may be   advised to limit vigorous-intensity exercise during your pregnancy. You can tell that you are exercising at a vigorous intensity if you are breathing much harder and faster and cannot hold a conversation while exercising. Some examples of exercising that you should avoid during pregnancy include:  Contact sports.  Activities that place you at risk for falling on or being hit in the belly, such as downhill skiing, water skiing, surfing, rock climbing, cycling, gymnastics, and horseback riding.  Scuba diving.  Sky diving.  Yoga or Pilates in a room that is heated to extreme temperatures ("hot yoga" or "hot Pilates").  Jogging or running, unless you ran or jogged regularly before your pregnancy. While jogging or running, you should always be able  to talk in full sentences. Do not run or jog so vigorously that you are unable to have a conversation.  If you are not used to exercising at elevation (more than 6,000 feet above sea level), do not do so during your pregnancy.  When should I avoid exercising during pregnancy? Certain medical conditions can make it unsafe to exercise during pregnancy, or they may increase your risk of miscarriage or early labor and birth. Some of these conditions include:  Some types of heart disease.  Some types of lung disease.  Placenta previa. This is when the placenta partially or completely covers the opening of the uterus (cervix).  Frequent bleeding from the vagina during your pregnancy.  Incompetent cervix. This is when your cervix does not remain as tightly closed during pregnancy as it should.  Premature labor.  Ruptured membranes. This is when the protective sac (amniotic sac) opens up and amniotic fluid leaks from your vagina.  Severely low blood count (anemia).  Preeclampsia or pregnancy-caused high blood pressure.  Carrying more than one baby (multiple gestation) and having an additional risk of early labor.  Poorly controlled diabetes.  Being severely underweight or severely overweight.  Intrauterine growth restriction. This is when your baby's growth and development during pregnancy are slower than expected.  Other medical conditions. Ask your health care provider if any apply to you.  What else should I know about exercising during pregnancy? You should take these precautions while exercising during pregnancy:  Avoid overheating. ? Wear loose-fitting, breathable clothes. ? Do not exercise in very high temperatures.  Avoid dehydration. Drink enough water before, during, and after exercise to keep your urine clear or pale yellow.  Avoid overstretching. Because of hormone changes during pregnancy, it is easy to overstretch muscles, tendons, and ligaments during  pregnancy.  Start slowly and ask your health care provider to recommend types of exercise that are safe for you, if exercising regularly is new for you.  Pregnancy is not a time for exercising to lose weight. When should I seek medical care? You should stop exercising and call your health care provider if you have any unusual symptoms, such as:  Mild uterine contractions or abdominal cramping.  Dizziness that does not improve with rest.  When should I seek immediate medical care? You should stop exercising and call your local emergency services (911 in the [place] if you have any unusual symptoms, such as:  Sudden, severe pain in your low back or your belly.  Uterine contractions or abdominal cramping that do not improve with rest.  Chest pain.  Bleeding or fluid leaking from your vagina.  Shortness of breath.  This information is not intended to replace advice given to you by your health care provider. Make sure you discuss any   questions you have with your health care provider. Document Released: 05/04/2005 Document Revised: 10/02/2015 Document Reviewed: 07/12/2014 Elsevier Interactive Patient Education  2018 [employer] Eating Plan for Pregnant Women While you are pregnant, your body will require additional nutrition to help support your growing baby. It is recommended that you consume:  150 additional calories each day during your first trimester.  300 additional calories each day during your second trimester.  300 additional calories each day during your third trimester.  Eating a healthy, well-balanced diet is very important for your health and for your baby's health. You also have a higher need for some vitamins and minerals, such as folic acid, calcium, iron, and vitamin D. What do I need to know about eating during pregnancy?  Do not try to lose weight or go on a diet during pregnancy.  Choose healthy, nutritious foods. Choose  of a sandwich with a glass of milk  instead of a candy bar or a high-calorie sugar-sweetened beverage.  Limit your overall intake of foods that have "empty calories." These are foods that have little nutritional value, such as sweets, desserts, candies, sugar-sweetened beverages, and fried foods.  Eat a variety of foods, especially fruits and vegetables.  Take a prenatal vitamin to help meet the additional needs during pregnancy, specifically for folic acid, iron, calcium, and vitamin D.  Remember to stay active. Ask your health care provider for exercise recommendations that are specific to you.  Practice good food safety and cleanliness, such as washing your hands before you eat and after you prepare raw meat. This helps to prevent foodborne illnesses, such as listeriosis, that can be very dangerous for your baby. Ask your health care provider for more information about listeriosis. What does 150 extra calories look like? Healthy options for an additional 150 calories each day could be any of the following:  Plain low-fat yogurt (6-8 oz) with  cup of berries.  1 apple with 2 teaspoons of peanut butter.  Cut-up vegetables with  cup of hummus.  Low-fat chocolate milk (8 oz or 1 cup).  1 string cheese with 1 medium orange.   of a peanut butter and jelly sandwich on whole-wheat bread (1 tsp of peanut butter).  For 300 calories, you could eat two of those healthy options each day. What is a healthy amount of weight to gain? The recommended amount of weight for you to gain is based on your pre-pregnancy BMI. If your pre-pregnancy BMI was:  Less than 18 (underweight), you should gain 28-40 lb.  18-24.9 (normal), you should gain 25-35 lb.  25-29.9 (overweight), you should gain 15-25 lb.  Greater than 30 (obese), you should gain 11-20 lb.  What if I am having twins or multiples? Generally, pregnant women who will be having twins or multiples may need to increase their daily calories by 300-600 calories each day. The  recommended range for total weight gain is 25-54 lb, depending on your pre-pregnancy BMI. Talk with your health care provider for specific guidance about additional nutritional needs, weight gain, and exercise during your pregnancy. What foods can I eat? Grains Any grains. Try to choose whole grains, such as whole-wheat bread, oatmeal, or brown rice. Vegetables Any vegetables. Try to eat a variety of colors and types of vegetables to get a full range of vitamins and minerals. Remember to wash your vegetables well before eating. Fruits Any fruits. Try to eat a variety of colors and types of fruit to get a full range of vitamins and   minerals. Remember to wash your fruits well before eating. Meats and Other Protein Sources Lean meats, including chicken, [place], fish, and lean cuts of beef, veal, or pork. Make sure that all meats are cooked to "well done." Tofu. Tempeh. Beans. Eggs. Peanut butter and other nut butters. Seafood, such as shrimp, crab, and lobster. If you choose fish, select types that are higher in omega-3 fatty acids, including salmon, herring, mussels, trout, sardines, and pollock. Make sure that all meats are cooked to food-safe temperatures. Dairy Pasteurized milk and milk alternatives. Pasteurized yogurt and pasteurized cheese. Cottage cheese. Sour cream. Beverages Water. Juices that contain 100% fruit juice or vegetable juice. Caffeine-free teas and decaffeinated coffee. Drinks that contain caffeine are okay to drink, but it is better to avoid caffeine. Keep your total caffeine intake to less than 200 mg each day (12 oz of coffee, tea, or soda) or as directed by your health care provider. Condiments Any pasteurized condiments. Sweets and Desserts Any sweets and desserts. Fats and Oils Any fats and oils. The items listed above may not be a complete list of recommended foods or beverages. Contact your dietitian for more options. What foods are not  recommended? Vegetables Unpasteurized (raw) vegetable juices. Fruits Unpasteurized (raw) fruit juices. Meats and Other Protein Sources Cured meats that have nitrates, such as bacon, salami, and hotdogs. Luncheon meats, bologna, or other deli meats (unless they are reheated until they are steaming hot). Refrigerated pate, meat spreads from a meat counter, smoked seafood that is found in the refrigerated section of a store. Raw fish, such as sushi or sashimi. High mercury content fish, such as tilefish, shark, swordfish, and king mackerel. Raw meats, such as tuna or beef tartare. Undercooked meats and poultry. Make sure that all meats are cooked to food-safe temperatures. Dairy Unpasteurized (raw) milk and any foods that have raw milk in them. Soft cheeses, such as feta, queso blanco, queso fresco, Brie, Camembert cheeses, blue-veined cheeses, and Panela cheese (unless it is made with pasteurized milk, which must be stated on the label). Beverages Alcohol. Sugar-sweetened beverages, such as sodas, teas, or energy drinks. Condiments Homemade fermented foods and drinks, such as pickles, sauerkraut, or kombucha drinks. (Store-bought pasteurized versions of these are okay.) Other Salads that are made in the store, such as ham salad, chicken salad, egg salad, tuna salad, and seafood salad. The items listed above may not be a complete list of foods and beverages to avoid. Contact your dietitian for more information. This information is not intended to replace advice given to you by your health care provider. Make sure you discuss any questions you have with your health care provider. Document Released: 02/16/2014 Document Revised: 10/10/2015 Document Reviewed: 10/17/2013 Elsevier Interactive Patient Education  2018 [employer]. Perinatal Depression When a woman feels excessive sadness, anger, or anxiety during pregnancy or during the first 12 months after she gives birth, she has a condition called  perinatal depression. Depression can interfere with work, school, relationships, and other everyday activities. If it is not managed properly, it can also cause problems in the mother and her baby. Sometimes, perinatal depression is left untreated because symptoms are thought to be normal mood swings during and right after pregnancy. If you have symptoms of depression, it is important to talk with your health care provider. What are the causes? The exact cause of this condition is not known. Hormonal changes during and after pregnancy may play a role in causing perinatal depression. What increases the risk? You are more  likely to develop this condition if:  You have a personal or family history of depression, anxiety, or mood disorders.  You experience a stressful life event during pregnancy, such as the death of a loved one.  You have a lot of regular life stress.  You do not have support from family members or loved ones, or you are in an abusive relationship.  What are the signs or symptoms? Symptoms of this condition include:  Feeling sad or hopeless.  Feelings of guilt.  Feeling irritable or overwhelmed.  Changes in your appetite.  Lack of energy or motivation.  Sleep problems.  Difficulty concentrating or completing tasks.  Loss of interest in hobbies or relationships.  Headaches or stomach problems that do not go away.  How is this diagnosed? This condition is diagnosed based on a physical exam and mental evaluation. In some cases, your health care provider may use a depression screening tool. These tools include a list of questions that can help a health care provider diagnose depression. Your health care provider may refer you to a mental health expert who specializes in depression. How is this treated? This condition may be treated with:  Medicines. Your health care provider will only give you medicines that have been proven safe for pregnancy and  breastfeeding.  Talk therapy with a mental health professional to help change your patterns of thinking (cognitive behavioral therapy).  Support groups.  Brain stimulation or light therapies.  Stress reduction therapies, such as mindfulness.  Follow these instructions at home: Lifestyle  Do not use any products that contain nicotine or tobacco, such as cigarettes and e-cigarettes. If you need help quitting, ask your health care provider.  Do not use alcohol when you are pregnant. After your baby is born, limit alcohol intake to no more than 1 drink a day. One drink equals 12 oz of beer, 5 oz of wine, or 1 oz of hard liquor.  Consider joining a support group for new mothers. Ask your health care provider for recommendations.  Take good care of yourself. Make sure you: ? Get plenty of sleep. If you are having trouble sleeping, talk with your health care provider. ? Eat a healthy diet. This includes plenty of fruits and vegetables, whole grains, and lean proteins. ? Exercise regularly, as told by your health care provider. Ask your health care provider what exercises are safe for you. General instructions  Take over-the-counter and prescription medicines only as told by your health care provider.  Talk with your partner or family members about your feelings during pregnancy. Share any concerns or anxieties that you may have.  Ask for help with tasks or chores when you need it. Ask friends and family members to provide meals, watch your children, or help with cleaning.  Keep all follow-up visits as told by your health care provider. This is important. Contact a health care provider if:  You (or people close to you) notice that you have any symptoms of depression.  You have depression and your symptoms get worse.  You experience side effects from medicines, such as nausea or sleep problems. Get help right away if:  You feel like hurting yourself, your baby, or someone else. If  you ever feel like you may hurt yourself or others, or have thoughts about taking your own life, get help right away. You can go to your nearest emergency department or call:  Your local emergency services (911 in the [place]  A suicide crisis helpline, such as  the National Suicide Prevention Lifeline at 539-287-6555. This is open 24 hours a day.  Summary  Perinatal depression is when a woman feels excessive sadness, anger, or anxiety during pregnancy or during the first 12 months after she gives birth.  If perinatal depression is not treated, it can lead to health problems for the mother and her baby.  This condition is treated with medicines, talk therapy, stress reduction therapies, or a combination of two or more treatments.  Talk with your partner or family members about your feelings. Do not be afraid to ask for help. This information is not intended to replace advice given to you by your health care provider. Make sure you discuss any questions you have with your health care provider. Document Released: 07/01/2016 Document Revised: 07/01/2016 Document Reviewed: 07/01/2016 Elsevier Interactive Patient Education  2018 [employer]. Coping With Depression, Teen Depression is an experience of feeling down, blue, or sad. Depression can affect your thoughts and feelings, relationships, daily activities, and physical health. It is caused by changes in your brain that can be triggered by stress in your life or a serious loss. Everyone experiences occasional disappointment, sadness, and loss in their lives. When you are feeling down, blue, or sad for at least 2 weeks in a row, it may mean that you have depression. If you receive a diagnosis of depression, your health care provider will tell you which type of depression you have and the possible treatments to help. How can depression affect me? Being depressed can make daily activities more difficult. It can negatively affect your daily life,  from school and sports performance to work and relationships. When you are depressed, you may:  Want to be alone.  Avoid interacting with others.  Avoid doing the things you usually like to do.  Notice changes in your sleep habits.  Find it harder than usual to wake up and go to school or work.  Feel angry at everyone.  Feel like you do not have any patience.  Have trouble concentrating.  Feel tired all the time.  Notice changes in your appetite.  Lose or gain weight without trying.  Have constant headaches or stomachaches.  Think about death or attempting suicide often.  What are things I can do to deal with depression? If you have had symptoms of depression for more than 2 weeks, talk with your parents or an adult you trust, such as a Veterinary surgeon at school or church or a Psychologist, occupational. You might be tempted to only tell friends, but you should tell an adult too. The hardest step in dealing with depression is admitting that you are feeling it to someone. The more people who know, the more likely you will be to get some help. Certain types of counseling can be very helpful in treating depression. A counseling professional can assess what treatments are going to be most helpful for you. These may include:  Talk therapy.  Medicines.  Brain stimulation therapy.  There are a number of other things you can do that can help you cope with depression on a daily basis, including:  Spending time in nature.  Spending time with trusted friends who help you feel better.  Taking time to think about the positive things in your life and to feel grateful for them.  Exercising, such as playing an active game with some friends or going for a run.  Spending less time using electronics, especially at night before bed. The screens of TVs, computers, tablets, and phones  make your brain think it is time to get up rather than go to bed.  Avoiding spending too much time spacing out on TV or video games.  This might feel good for a while, but it ends up just being a way to avoid the feelings of depression.  What should I do if my depression gets worse? If you are having trouble managing your depression or if your depression gets worse, talk to your health care provider about making adjustments to your treatment plan. You should get help immediately if:  You feel suicidal and are making a plan to commit suicide.  You are drinking or using drugs to stop the pain from your depression.  You are cutting yourself or thinking about cutting yourself.  You are thinking about hurting others and are making a plan to do so.  You believe the world would be better off without you in it.  You are isolating yourself completely and not talking with anyone.  If you find yourself in any of these situations, you should do one of the following:  Immediately tell your parents or best friend.  Call and go see your health care provider or health professional.  Call the suicide prevention hotline ((279)750-9601 in the [place]  Text the crisis line 430-101-9439 in the [place]  Where can I get support? It is important to know that although depression is serious, you can find support from a variety of sources. Sources of help may include:  Suicide prevention, crisis prevention, and depression hotlines.  School teachers, counselors, Systems developer, or clergy.  Parents or other family members.  Support groups.  You can locate a counselor or support group in your area from one of the following sources:  Mental Health America: www.mentalhealthamerica.net  Anxiety and Depression Association of [place] (ADAA): ProgramCam.de  The [employer] on Mental Illness (NAMI): www.nami.org  This information is not intended to replace advice given to you by your health care provider. Make sure you discuss any questions you have with your health care provider. Document Released: 05/24/2015 Document Revised: 10/10/2015 Document  Reviewed: 05/24/2015 Elsevier Interactive Patient Education  [employer].
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
## 2017-08-27 NOTE — Progress Notes (Signed)
NOB today. No vb. No lof ?

## 2017-09-02 LAB — URINE DRUG PANEL 7
Amphetamines, Urine: NEGATIVE ng/mL
BARBITURATE QUANT UR: NEGATIVE ng/mL
Benzodiazepine Quant, Ur: NEGATIVE ng/mL
Cannabinoid Quant, Ur: POSITIVE — AB
Cocaine (Metab.): NEGATIVE ng/mL
Opiate Quant, Ur: NEGATIVE ng/mL
PCP Quant, Ur: NEGATIVE ng/mL

## 2017-09-02 LAB — URINE CULTURE: ORGANISM ID, BACTERIA: NO GROWTH

## 2017-09-02 LAB — CHLAMYDIA/GONOCOCCUS/TRICHOMONAS, NAA
CHLAMYDIA BY NAA: NEGATIVE
GONOCOCCUS BY NAA: NEGATIVE
TRICH VAG BY NAA: NEGATIVE

## 2017-09-07 ENCOUNTER — Telehealth: Payer: Self-pay

## 2017-09-07 ENCOUNTER — Other Ambulatory Visit: Payer: BLUE CROSS/BLUE SHIELD

## 2017-09-07 ENCOUNTER — Encounter: Payer: BLUE CROSS/BLUE SHIELD | Admitting: Obstetrics and Gynecology

## 2017-09-07 NOTE — Telephone Encounter (Signed)
Pt needing an RX for nausea as it is interfering with morning classes. Saw Erskine SquibbJane last, however she is not in office. Can you get CG to RX this for patient please? CB # [phone]

## 2017-09-07 NOTE — Telephone Encounter (Signed)
Can she come in for samples of Bonjesta? She can start taking one at night x 3 nights and if still nauseous during day, can repeat one in AM.

## 2017-09-07 NOTE — Telephone Encounter (Signed)
Will you please rx a nausea med for this pt due to JEG out of the office. Thank you.

## 2017-09-08 NOTE — Telephone Encounter (Signed)
Left msg for pt to call back

## 2017-09-08 NOTE — Telephone Encounter (Signed)
Pt returned call and samples of bonjesta left at front desk. Pt will call for rx if this works for her.

## 2017-09-14 ENCOUNTER — Encounter: Payer: Self-pay | Admitting: Obstetrics and Gynecology

## 2017-09-14 ENCOUNTER — Other Ambulatory Visit: Payer: BLUE CROSS/BLUE SHIELD

## 2017-09-14 ENCOUNTER — Ambulatory Visit (INDEPENDENT_AMBULATORY_CARE_PROVIDER_SITE_OTHER): Payer: BLUE CROSS/BLUE SHIELD | Admitting: Obstetrics and Gynecology

## 2017-09-14 VITALS — BP 100/66 | Wt 88.0 lb

## 2017-09-14 DIAGNOSIS — Z3A08 8 weeks gestation of pregnancy: Secondary | ICD-10-CM

## 2017-09-14 DIAGNOSIS — Z915 Personal history of self-harm: Secondary | ICD-10-CM

## 2017-09-14 DIAGNOSIS — O0990 Supervision of high risk pregnancy, unspecified, unspecified trimester: Secondary | ICD-10-CM

## 2017-09-14 DIAGNOSIS — F4323 Adjustment disorder with mixed anxiety and depressed mood: Secondary | ICD-10-CM

## 2017-09-14 DIAGNOSIS — Z9151 Personal history of suicidal behavior: Secondary | ICD-10-CM

## 2017-09-14 NOTE — Progress Notes (Signed)
Routine Prenatal Care Visit  Subjective  Dawn Meza is a 22 y.o. G1P0 at [redacted]w[redacted]d being seen today for ongoing prenatal care.  She is currently monitored for the following issues for this low-risk pregnancy and has Adjustment disorder with mixed anxiety and depressed mood; History of suicide attempt; and Supervision of high risk pregnancy, antepartum on their problem list.  ----------------------------------------------------------------------------------- Patient reports nausea.  States that another provider left her a bag of medicine for nausea. She will try that. No there complaints. States that she is 100% sure of her LMP. Contractions: Not present. Vag. Bleeding: None.  Movement: Absent. Denies leaking of fluid.  ----------------------------------------------------------------------------------- The following portions of the patient's history were reviewed and updated as appropriate: allergies, current medications, past family history, past medical history, past social history, past surgical history and problem list. Problem list updated.  Objective  Blood pressure 100/66, weight 88 lb (39.9 kg), last menstrual period 07/19/2017. Pregravid weight 88 lb (39.9 kg) Total Weight Gain 0 lb (0 kg) Urinalysis:      Fetal Status: Fetal Heart Rate (bpm): 185   Movement: Absent     General:  Alert, oriented and cooperative. Patient is in no acute distress.  Skin: Skin is warm and dry. No rash noted.   Cardiovascular: Normal heart rate noted  Respiratory: Normal respiratory effort, no problems with respiration noted  Abdomen: Soft, gravid, appropriate for gestational age. Pain/Pressure: Present     Pelvic:  Cervical exam deferred        Extremities: Normal range of motion.     Mental Status: Normal mood and affect. Normal behavior. Normal judgment and thought content.   BSUS:  Single, living intrauterine pregnancy with CRL 1.77 cm, consistent with GA of [redacted]w[redacted]d and EDD of 04/24/18 FHR: 183 bpm. No  obvious issues, no obvious adnexal abnormalities.  However, only a cursory evaluation taken.   Assessment   22 y.o. G1P0 at [redacted]w[redacted]d by  04/25/2018, by Last Menstrual Period presenting for routine prenatal visit  Plan   pregnancy Problems (from 08/27/17 to present)    Problem Noted Resolved   History of suicide attempt 08/27/2017 by Tresea Mall, CNM No   Supervision of high risk pregnancy, antepartum 08/27/2017 by Tresea Mall, CNM No   Overview Signed 08/27/2017 11:02 AM by Tresea Mall, CNM    Clinic [facility] Prenatal Labs  Dating  Blood type:     Genetic Screen 1 Screen:    AFP:     Quad:     NIPS: Antibody:   Anatomic [place]  Rubella:   Varicella: @  GTT Early:               Third trimester:  RPR: Non Reactive (05/21 2311)   Rhogam  HBsAg:     TDaP vaccine                       Flu Shot: HIV: Non Reactive (05/21 2311)   Baby Food                                GBS:   Contraception  Pap:  CBB     CS/VBAC NA   Support Person Mom- Robin        Adjustment disorder with mixed anxiety and depressed mood 06/17/2016 by Kerry Hough, PA-C No      Preterm labor symptoms and general obstetric precautions including but not limited to vaginal bleeding,  contractions, leaking of fluid and fetal movement were reviewed in detail with the patient. Please refer to After Visit Summary for other counseling recommendations.   EDD confirmed by beside u/s today  Return in about 1 month (around 10/12/2017) for schedule u/s for NT and routine prenatal after (labs too).  Thomasene Mohair, MD, [facility], [facility] 09/14/2017 1:00 PM

## 2017-09-14 NOTE — Progress Notes (Incomplete)
  Routine Prenatal Care Visit  Subjective  Dawn Meza is a 22 y.o. G1P0 at [redacted]w[redacted]d being seen today for ongoing prenatal care.  She is currently monitored for the following issues for this {Blank single:19197::"high-risk","low-risk"} pregnancy and has Adjustment disorder with mixed anxiety and depressed mood; History of suicide attempt; and Supervision of high risk pregnancy, antepartum on their problem list.  ----------------------------------------------------------------------------------- Patient reports {sx:[id number]}.   Contractions: Not present. Vag. Bleeding: None.  Movement: Absent. Denies leaking of fluid.  ----------------------------------------------------------------------------------- The following portions of the patient's history were reviewed and updated as appropriate: allergies, current medications, past family history, past medical history, past social history, past surgical history and problem list. Problem list updated.   Objective  Blood pressure 100/66, weight 88 lb (39.9 kg), last menstrual period 07/19/2017. Pregravid weight 88 lb (39.9 kg) Total Weight Gain 0 lb (0 kg) Urinalysis:      Fetal Status: Fetal Heart Rate (bpm): 185   Movement: Absent     General:  Alert, oriented and cooperative. Patient is in no acute distress.  Skin: Skin is warm and dry. No rash noted.   Cardiovascular: Normal heart rate noted  Respiratory: Normal respiratory effort, no problems with respiration noted  Abdomen: Soft, gravid, appropriate for gestational age. Pain/Pressure: Present     Pelvic:  {Blank single:19197::"Cervical exam performed","Cervical exam deferred"}        Extremities: Normal range of motion.     Mental Status: Normal mood and affect. Normal behavior. Normal judgment and thought content.   Assessment   22 y.o. G1P0 at [redacted]w[redacted]d by  04/25/2018, by Last Menstrual Period presenting for {Blank single:19197::"routine","work-in"} prenatal visit  Plan   pregnancy Problems  (from 08/27/17 to present)    Problem Noted Resolved   History of suicide attempt 08/27/2017 by Tresea Mall, CNM No       {Blank single:19197::"Term","Preterm"} labor symptoms and general obstetric precautions including but not limited to vaginal bleeding, contractions, leaking of fluid and fetal movement were reviewed in detail with the patient. Please refer to After Visit Summary for other counseling recommendations.   Return in about 1 month (around 10/12/2017) for schedule u/s for NT and routine prenatal after (labs too).  Thomasene Mohair, MD, [facility], [facility] 09/14/2017 1:00 PM  A lot of nausea, no vb. No lof.

## 2017-09-14 NOTE — Progress Notes (Incomplete)
  Routine Prenatal Care Visit  Subjective  Niah Gilpatrick is a 22 y.o. G1P0 at [redacted]w[redacted]d being seen today for ongoing prenatal care.  She is currently monitored for the following issues for this {Blank single:19197::"high-risk","low-risk"} pregnancy and has Adjustment disorder with mixed anxiety and depressed mood; History of suicide attempt; and Supervision of high risk pregnancy, antepartum on their problem list.  ----------------------------------------------------------------------------------- Patient reports {sx:14538}.   Contractions: Not present. Vag. Bleeding: None.  Movement: Absent. Denies leaking of fluid.  ----------------------------------------------------------------------------------- The following portions of the patient's history were reviewed and updated as appropriate: allergies, current medications, past family history, past medical history, past social history, past surgical history and problem list. Problem list updated.   Objective  Blood pressure 100/66, weight 88 lb (39.9 kg), last menstrual period 07/19/2017. Pregravid weight 88 lb (39.9 kg) Total Weight Gain 0 lb (0 kg) Urinalysis:      Fetal Status: Fetal Heart Rate (bpm): 185   Movement: Absent     General:  Alert, oriented and cooperative. Patient is in no acute distress.  Skin: Skin is warm and dry. No rash noted.   Cardiovascular: Normal heart rate noted  Respiratory: Normal respiratory effort, no problems with respiration noted  Abdomen: Soft, gravid, appropriate for gestational age. Pain/Pressure: Present     Pelvic:  {Blank single:19197::"Cervical exam performed","Cervical exam deferred"}        Extremities: Normal range of motion.     Mental Status: Normal mood and affect. Normal behavior. Normal judgment and thought content.   Assessment   22 y.o. G1P0 at [redacted]w[redacted]d by  04/25/2018, by Last Menstrual Period presenting for {Blank single:19197::"routine","work-in"} prenatal visit  Plan   pregnancy Problems  (from 08/27/17 to present)    Problem Noted Resolved   History of suicide attempt 08/27/2017 by Gledhill, Jane, CNM No       {Blank single:19197::"Term","Preterm"} labor symptoms and general obstetric precautions including but not limited to vaginal bleeding, contractions, leaking of fluid and fetal movement were reviewed in detail with the patient. Please refer to After Visit Summary for other counseling recommendations.   Return in about 1 month (around 10/12/2017) for schedule u/s for NT and routine prenatal after (labs too).  Stephen Jackson, MD, FACOG [facility], [facility] 09/14/2017 1:00 PM  A lot of nausea, no vb. No lof.  
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
## 2017-10-12 ENCOUNTER — Ambulatory Visit (INDEPENDENT_AMBULATORY_CARE_PROVIDER_SITE_OTHER): Payer: BLUE CROSS/BLUE SHIELD

## 2017-10-12 ENCOUNTER — Ambulatory Visit (INDEPENDENT_AMBULATORY_CARE_PROVIDER_SITE_OTHER): Payer: BLUE CROSS/BLUE SHIELD | Admitting: Obstetrics and Gynecology

## 2017-10-12 VITALS — BP 100/60 | Wt 87.0 lb

## 2017-10-12 DIAGNOSIS — Z113 Encounter for screening for infections with a predominantly sexual mode of transmission: Secondary | ICD-10-CM

## 2017-10-12 DIAGNOSIS — O0990 Supervision of high risk pregnancy, unspecified, unspecified trimester: Secondary | ICD-10-CM

## 2017-10-12 DIAGNOSIS — O0991 Supervision of high risk pregnancy, unspecified, first trimester: Secondary | ICD-10-CM | POA: Diagnosis not present

## 2017-10-12 DIAGNOSIS — Z1379 Encounter for other screening for genetic and chromosomal anomalies: Secondary | ICD-10-CM

## 2017-10-12 DIAGNOSIS — Z915 Personal history of self-harm: Secondary | ICD-10-CM

## 2017-10-12 DIAGNOSIS — Z3A12 12 weeks gestation of pregnancy: Secondary | ICD-10-CM | POA: Diagnosis not present

## 2017-10-12 DIAGNOSIS — G44219 Episodic tension-type headache, not intractable: Secondary | ICD-10-CM

## 2017-10-12 DIAGNOSIS — Z9151 Personal history of suicidal behavior: Secondary | ICD-10-CM

## 2017-10-12 DIAGNOSIS — J302 Other seasonal allergic rhinitis: Secondary | ICD-10-CM

## 2017-10-12 MED ORDER — BUTALBITAL-APAP-CAFFEINE 50-325-40 MG PO CAPS: 1.0000 | ORAL_CAPSULE | Freq: Four times a day (QID) | ORAL | 3 refills | Status: DC | PRN

## 2017-10-12 NOTE — Progress Notes (Signed)
Routine Prenatal Care Visit  Subjective  Dawn Meza is a 22 y.o. G1P0 at [redacted]w[redacted]d being seen today for ongoing prenatal care.  She is currently monitored for the following issues for this high-risk pregnancy and has Adjustment disorder with mixed anxiety and depressed mood; History of suicide attempt; Supervision of high risk pregnancy, antepartum; and Seasonal allergies on their problem list.  ----------------------------------------------------------------------------------- Patient reports headaches, seasonal allergies with nasal drainage, and recent injury of her head on the refridgerator door about 4 days ago. Mood is improved. She is not taking her Zoloft.  Contractions: Not present. Vag. Bleeding: None.  Movement: Absent. Denies leaking of fluid.  ----------------------------------------------------------------------------------- The following portions of the patient's history were reviewed and updated as appropriate: allergies, current medications, past family history, past medical history, past social history, past surgical history and problem list. Problem list updated.   Objective  Blood pressure 100/60, weight 87 lb (39.5 kg), last menstrual period 07/19/2017. Pregravid weight 88 lb (39.9 kg) Total Weight Gain -1 lb (-0.454 kg) Urinalysis:      Fetal Status: Fetal Heart Rate (bpm): 181   Movement: Absent     General:  Alert, oriented and cooperative. Patient is in no acute distress.  Skin: Skin is warm and dry. No rash noted.   Cardiovascular: Normal heart rate noted  Respiratory: Normal respiratory effort, no problems with respiration noted  Abdomen: Soft, gravid, appropriate for gestational age. Pain/Pressure: Absent     Pelvic:  Cervical exam deferred        Extremities: Normal range of motion.     ental Status: Normal mood and affect. Normal behavior. Normal judgment and thought content.     Assessment   22 y.o. G1P0 at [redacted]w[redacted]d by  04/25/2018, by Last Menstrual Period  presenting for routine prenatal visit  Plan   pregnancy Problems (from 08/27/17 to present)    Problem Noted Resolved   History of suicide attempt 08/27/2017 by Tresea Mall, CNM No   Supervision of high risk pregnancy, antepartum 08/27/2017 by Tresea Mall, CNM No   Overview Addendum 10/12/2017 11:20 AM by Natale Milch, MD    Clinic [facility]atal [facility]  Dating LMP = 8wk [place] Blood type:     Genetic Screen 1 Screen: Collected 5/28     AFP:          Antibody:   Anatomic [place]  Rubella:   Varicella:   GTT Early:               Third trimester:  RPR:     Rhogam  HBsAg:     TDaP vaccine                       Flu Shot: HIV:     Baby Food                                GBS:   Contraception  Pap:  CBB     CS/VBAC NA   Support Person Mom- Robin           Adjustment disorder with mixed anxiety and depressed mood 06/17/2016 by Kerry Hough, PA-C No       Gestational age appropriate obstetric precautions including but not limited to vaginal bleeding, contractions, leaking of fluid and fetal movement were reviewed in detail with the patient.    Seasonal allergies: recommended starting daily OTC Claritin Depression: patient reports  her mood is stable and improved with pregnancy, she discontinued zoloft. Encouraged patient to establish with a counselor/therapist.  First trimester screening today Prenatal labs drawn today Advised [facility] prenatal classes.  Return in about 1 month (around 11/09/2017) for ROB.  Adelene Idler MD [facility], [facility] 10/12/17 12:02 PM

## 2017-10-13 ENCOUNTER — Other Ambulatory Visit: Payer: BLUE CROSS/BLUE SHIELD

## 2017-10-13 ENCOUNTER — Encounter: Payer: BLUE CROSS/BLUE SHIELD | Admitting: Maternal Newborn

## 2017-10-19 LAB — FIRST TRIMESTER SCREEN W/NT
CRL: 62 mm
DIA MOM: 0.77
DIA VALUE: 273.4 pg/mL
GEST AGE-COLLECT: 12.4 wk
HCG MOM: 1.06
MATERNAL AGE AT EDD: 22.3 a
NUCHAL TRANSLUCENCY: 1.4 mm
NUMBER OF FETUSES: 1
Nuchal Translucency MoM: 0.89
PAPP-A MoM: 1.1
PAPP-A Value: 1846.7 ng/mL
TEST RESULTS: NEGATIVE
Weight: 87 [lb_av]
hCG Value: 147.5 IU/mL

## 2017-10-19 LAB — RPR+RH+ABO+RUB AB+AB SCR+CB...
Antibody Screen: NEGATIVE
HIV Screen 4th Generation wRfx: NONREACTIVE
Hematocrit: 37.8 % (ref 34.0–46.6)
Hemoglobin: 12.2 g/dL (ref 11.1–15.9)
Hepatitis B Surface Ag: NEGATIVE
MCH: 27.9 pg (ref 26.6–33.0)
MCHC: 32.3 g/dL (ref 31.5–35.7)
MCV: 86 fL (ref 79–97)
PLATELETS: 350 10*3/uL (ref 150–450)
RBC: 4.38 x10E6/uL (ref 3.77–5.28)
RDW: 13.7 % (ref 12.3–15.4)
RPR Ser Ql: NONREACTIVE
RUBELLA: 4.02 {index} (ref >0.99)
Rh Factor: POSITIVE
VARICELLA: 459 {index} (ref >165)
WBC: 11 10*3/uL — AB (ref 3.4–10.8)

## 2017-10-25 NOTE — Progress Notes (Signed)
Please call patient and let her know results were negative

## 2017-10-25 NOTE — Progress Notes (Signed)
Negative Screen, called. Left message asking patient to return the call.

## 2017-10-26 ENCOUNTER — Telehealth: Payer: Self-pay

## 2017-10-26 NOTE — Telephone Encounter (Signed)
Pt states her Dentist has rx'd her penicillin 500mg  & Tylenol #3. Pt verifying if this is safe since she is pregnant. She gave our information to her dentist. They may be contacting us as well. Cb#[phone]

## 2017-10-26 NOTE — Telephone Encounter (Signed)
Spoke w/pt. Notified of safe meds on pregnancy dental fax. Advised to check with pharmacist on the penicillin vs ampicillin listed on dental fax prior to taking. Pt states [facility] [address] [place] [phone]#[phone]. She has an infection in her root canal & also her wisdom teeth are coming in. She has a consultation with a surgeon on 6/19 Dr. Fara Oldenobert E Jepko [phone].

## 2017-10-27 NOTE — Telephone Encounter (Signed)
Pt calling to follow up with this. She is stating she doesn't think dental note has been received. CB# [phone]

## 2017-11-01 NOTE — Telephone Encounter (Signed)
Pt notified Dental fax was sent when requested. Have been resent & confirmation confirmed.

## 2017-11-02 ENCOUNTER — Other Ambulatory Visit: Payer: Self-pay | Admitting: Maternal Newborn

## 2017-11-02 ENCOUNTER — Encounter: Payer: Self-pay | Admitting: Advanced Practice Midwife

## 2017-11-02 ENCOUNTER — Ambulatory Visit (INDEPENDENT_AMBULATORY_CARE_PROVIDER_SITE_OTHER): Payer: BLUE CROSS/BLUE SHIELD | Admitting: Advanced Practice Midwife

## 2017-11-02 VITALS — BP 94/54 | Wt 88.0 lb

## 2017-11-02 DIAGNOSIS — O9989 Other specified diseases and conditions complicating pregnancy, childbirth and the puerperium: Secondary | ICD-10-CM

## 2017-11-02 DIAGNOSIS — Z3A15 15 weeks gestation of pregnancy: Secondary | ICD-10-CM

## 2017-11-02 DIAGNOSIS — Z113 Encounter for screening for infections with a predominantly sexual mode of transmission: Secondary | ICD-10-CM

## 2017-11-02 DIAGNOSIS — F4323 Adjustment disorder with mixed anxiety and depressed mood: Secondary | ICD-10-CM

## 2017-11-02 DIAGNOSIS — R102 Pelvic and perineal pain: Secondary | ICD-10-CM

## 2017-11-02 DIAGNOSIS — O99342 Other mental disorders complicating pregnancy, second trimester: Secondary | ICD-10-CM

## 2017-11-02 NOTE — Patient Instructions (Signed)

## 2017-11-03 NOTE — Progress Notes (Signed)
Routine Prenatal Care Visit  Subjective  Dawn Meza is a 22 y.o. G1P0 at 3323w2d being seen today for ongoing prenatal care.  She is currently monitored for the following issues for this high-risk pregnancy and has Adjustment disorder with mixed anxiety and depressed mood; History of suicide attempt; Supervision of high risk pregnancy, antepartum; and Seasonal allergies on their problem list.  ----------------------------------------------------------------------------------- Patient reports sharp stabbing pain in lower pelvis for the past 1 day. The pain comes and goes and lasts for 20-30 minutes at a time. Reassurance given that it does not sound like contraction pain. She is encouraged to increase PO intake and have adequate hydration. She also has concern of 2 bumps in vulvar area that she recently noticed and she is concerned about possible STD. Marland Kitchen.   Contractions: Not present. Vag. Bleeding: None.  Movement: Absent. Denies leaking of fluid.  ----------------------------------------------------------------------------------- The following portions of the patient's history were reviewed and updated as appropriate: allergies, current medications, past family history, past medical history, past social history, past surgical history and problem list. Problem list updated.   Objective  Blood pressure (!) 94/54, weight 88 lb (39.9 kg), last menstrual period 07/19/2017. Pregravid weight 88 lb (39.9 kg) Total Weight Gain 0 lb (0 kg) Urinalysis: Urine Protein: Negative Urine Glucose: Negative  Fetal Status: Fetal Heart Rate (bpm): 160   Movement: Absent     General:  Alert, oriented and cooperative. Patient is in no acute distress.  Skin: Skin is warm and dry. No rash noted.   Cardiovascular: Normal heart rate noted  Respiratory: Normal respiratory effort, no problems with respiration noted  Abdomen: Soft, gravid, appropriate for gestational age. Pain/Pressure: Present     Pelvic:  Cervical exam  deferred      small blister like bumps- 1 on either side of labia in groin- lanced and cultured 1 for HSV lab  Extremities: Normal range of motion.  Edema: None  Mental Status: Normal mood and affect. Normal behavior. Normal judgment and thought content.   Assessment   22 y.o. G1P0 at 2423w2d by  04/25/2018, by Last Menstrual Period presenting for work-in prenatal visit, bumps on groin area either in-grown hair or possible hsv infection  Plan   pregnancy Problems (from 08/27/17 to present)    Problem Noted Resolved   History of suicide attempt 08/27/2017 by Tresea MallGledhill, Mahnoor Mathisen, CNM No   Supervision of high risk pregnancy, antepartum 08/27/2017 by Tresea MallGledhill, Aryani Daffern, CNM No   Overview Addendum 10/12/2017 11:20 AM by Natale MilchSchuman, Christanna R, MD    Clinic [facility] Prenatal Labs  Dating LMP = 8wk US Blood type:     Genetic Screen 1 Screen: Collected 5/28     AFP:          Antibody:   Anatomic US  Rubella:   Varicella: @VZVIGG @  GTT Early:               Third trimester:  RPR:     Rhogam  HBsAg:     TDaP vaccine                       Flu Shot: HIV:     Baby Food                                GBS:   Contraception  Pap:  CBB     CS/VBAC NA   Support Person Mom- Zella BallRobin  Adjustment disorder with mixed anxiety and depressed mood 06/17/2016 by Kerry Hough, PA-C No      HSV culture sent for new bumps in groin area  Preterm labor symptoms and general obstetric precautions including but not limited to vaginal bleeding, contractions, leaking of fluid and fetal movement were reviewed in detail with the patient. Please refer to After Visit Summary for other counseling recommendations.   Return for has f/u scheduled.  Tresea Mall, CNM 11/03/2017 3:33 PM

## 2017-11-07 LAB — CHLAMYDIA/GONOCOCCUS/TRICHOMONAS, NAA
CHLAMYDIA BY NAA: NEGATIVE
GONOCOCCUS BY NAA: NEGATIVE
Trich vag by NAA: NEGATIVE

## 2017-11-07 LAB — HERPES SIMPLEX VIRUS CULTURE

## 2017-11-09 ENCOUNTER — Ambulatory Visit (INDEPENDENT_AMBULATORY_CARE_PROVIDER_SITE_OTHER): Payer: BLUE CROSS/BLUE SHIELD | Admitting: Advanced Practice Midwife

## 2017-11-09 ENCOUNTER — Encounter: Payer: Self-pay | Admitting: Advanced Practice Midwife

## 2017-11-09 VITALS — BP 100/60 | Wt 90.0 lb

## 2017-11-09 DIAGNOSIS — O98312 Other infections with a predominantly sexual mode of transmission complicating pregnancy, second trimester: Secondary | ICD-10-CM

## 2017-11-09 DIAGNOSIS — B009 Herpesviral infection, unspecified: Secondary | ICD-10-CM

## 2017-11-09 DIAGNOSIS — F4322 Adjustment disorder with anxiety: Secondary | ICD-10-CM

## 2017-11-09 DIAGNOSIS — O0990 Supervision of high risk pregnancy, unspecified, unspecified trimester: Secondary | ICD-10-CM

## 2017-11-09 DIAGNOSIS — Z3A16 16 weeks gestation of pregnancy: Secondary | ICD-10-CM

## 2017-11-09 DIAGNOSIS — O99343 Other mental disorders complicating pregnancy, third trimester: Secondary | ICD-10-CM

## 2017-11-09 NOTE — Progress Notes (Signed)
Routine Prenatal Care Visit  Subjective  Dawn Meza is a 22 y.o. G1P0 at [redacted]w[redacted]d being seen today for ongoing prenatal care.  She is currently monitored for the following issues for this high-risk pregnancy and has Adjustment disorder with mixed anxiety and depressed mood; History of suicide attempt; Supervision of high risk pregnancy, antepartum; and Seasonal allergies on their problem list.  ----------------------------------------------------------------------------------- Patient reports no physical complaints. Reviewed the results of her labs from last week and she is visibly upset by the news of her positive HSV culture. Lengthy discussion of results, management, treatment during pregnancy and when not pregnant. She is having a difficult time taking in the information and is more focused on when she acquired the infection and from whom. Information given and she is encouraged to go to the [facility] website for more information.  The bump that was cultured last week is now healed per the patient.   Contractions: Not present. Vag. Bleeding: None.  Movement: Absent. Denies leaking of fluid.  ----------------------------------------------------------------------------------- The following portions of the patient's history were reviewed and updated as appropriate: allergies, current medications, past family history, past medical history, past social history, past surgical history and problem list. Problem list updated.   Objective  Blood pressure 100/60, weight 90 lb (40.8 kg), last menstrual period 07/19/2017. Pregravid weight 88 lb (39.9 kg) Total Weight Gain 2 lb (0.907 kg) Urinalysis:    unable to give specimen today  Fetal Status: Fetal Heart Rate (bpm): 156   Movement: Absent     General:  Alert, oriented and cooperative. Patient is in no acute distress.  Skin: Skin is warm and dry. No rash noted.   Cardiovascular: Normal heart rate noted  Respiratory: Normal respiratory effort, no problems  with respiration noted  Abdomen: Soft, gravid, appropriate for gestational age. Pain/Pressure: Absent     Pelvic:  Cervical exam deferred        Extremities: Normal range of motion.  Edema: None  Mental Status: Normal mood and affect. Normal behavior. Normal judgment and thought content.   Assessment   22 y.o. G1P0 at [redacted]w[redacted]d by  04/25/2018, by Last Menstrual Period presenting for routine prenatal visit  Plan   pregnancy Problems (from 08/27/17 to present)    Problem Noted Resolved   History of suicide attempt 08/27/2017 by Dawn Meza, CNM No   Supervision of high risk pregnancy, antepartum 08/27/2017 by Dawn Meza, CNM No   Overview Addendum 10/12/2017 11:20 AM by Dawn Milch, MD    Clinic [facility] Prenatal Labs  Dating LMP = 8wk [place] Blood type:     Genetic Screen 1 Screen: Collected 5/28     AFP:          Antibody:   Anatomic [place]  Rubella:   Varicella: @VZVIGG @  GTT Early:               Third trimester:  RPR:     Rhogam  HBsAg:     TDaP vaccine                       Flu Shot: HIV:     Baby Food                                GBS:   Contraception  Pap:  CBB     CS/VBAC NA   Support Person Mom- Dawn Meza Positive genital HSV infection diagnosed 11/02/2017  Adjustment disorder with mixed anxiety and depressed mood 06/17/2016 by Dawn Meza, Dawn Meza No       Preterm labor symptoms and general obstetric precautions including but not limited to vaginal bleeding, contractions, leaking of fluid and fetal movement were reviewed in detail with the patient. Please refer to After Visit Summary for other counseling recommendations.   Return in about 2 weeks (around 11/23/2017) for anatomy scan and rob.  Dawn Meza, CNM 11/09/2017 5:12 PM

## 2017-11-09 NOTE — Patient Instructions (Signed)
Pregnancy and Genital Herpes Genital herpes is an STD (sexually transmitted disease) that is caused by a virus. An active infection can cause itching, blisters, and sores (lesions) in the genital area or rectal area. This is called an outbreak. Symptoms of genital herpes may last several days and then go away. However, the virus remains in your body, so you may have more outbreaks of symptoms in the future (recurrent infection). Genital herpes is particularly concerning for pregnant women because the virus can be passed to the unborn or newborn baby and cause serious problems. When can the herpes virus be passed to my baby? The virus can be passed to your baby:  Before delivery. The virus can be passed to your unborn baby through the placenta. This is more likely to occur if you get herpes for the first time (primary infection) in the first 3 months of your pregnancy (first trimester). This can possibly cause a miscarriage or birth defects in the baby.  During delivery. This is more likely to occur if you become infected for the first time late in your pregnancy. The virus is less likely to pass to your baby if you had herpes before you became pregnant. This is because antibodies against the virus develop over a period of time. These antibodies help to protect the baby.  After delivery. As a newborn, your baby can get a herpes infection if you touch active lesions and then touch your baby without washing your hands.  Can I take medicines for herpes during pregnancy? Medicines may be prescribed that are safe for a mother and her unborn baby. The medicine can help to reduce symptoms or prevent another outbreak of the infection. If the infection happened before you became pregnant, medicine may be prescribed in the last 4 weeks of the pregnancy. This can help to prevent a breakout of the infection at the time of delivery. Will herpes affect my delivery? Your baby will likely need to be delivered by  cesarean delivery ("C-section") if:  You have an active, recurrent, or new herpes outbreak at the time of delivery. This is because the virus can pass to the baby through an infected birth canal. This can cause severe problems for the baby.  You have any signs or symptoms of the infection being present in the genital area, even if you do not have any visible lesions in the birth canal. Other symptoms may include genital pain, burning, and itching.  You develop the infection for the first time late in your pregnancy. Your health care provider may recommend a cesarean delivery because your body has not had the time to build up enough antibodies against the virus to protect the baby from getting the infection.  You will likely be able to have a vaginal delivery if you had a first-time herpes infection before your third trimester and you have no evidence of an outbreak when you go into labor. Can I breastfeed my baby? A woman who is infected with genital herpes can breastfeed her baby. The virus will not be present in the breast milk. If lesions are present on a breast, the baby should not breastfeed from the affected breast. How can I avoid passing herpes to my baby after delivery? Take these actions that can help you to avoid passing the virus to your baby:  Wash your hands with soap and water often and before touching your baby.  If you have an outbreak, keep the area clean and covered.  Try to avoid  physical and stressful situations that may bring on an outbreak.  When should I seek medical care? Seek medical care if you have signs or symptoms of a herpes outbreak at any time during your pregnancy. These signs or symptoms may include:  A rash, blisters, lesions, or ulcers in your genital area or rectal area.  Burning, itching, or pain in your genital area or rectal area.  Trouble urinating.  This information is not intended to replace advice given to you by your health care provider.  Make sure you discuss any questions you have with your health care provider. Document Released: 08/10/2000 Document Revised: 10/07/2015 Document Reviewed: 10/22/2014 Elsevier Interactive Patient Education  2018 [employer] Genital Herpes Genital herpes is a common sexually transmitted infection (STI) that is caused by a virus. The virus spreads from person to person through sexual contact. Infection can cause itching, blisters, and sores around the genitals or rectum. Symptoms may last several days and then go away This is called an outbreak. However, the virus remains in your body, so you may have more outbreaks in the future. The time between outbreaks varies and can be months or years. Genital herpes affects men and women. It is particularly concerning for pregnant women because the virus can be passed to the baby during delivery and can cause serious problems. Genital herpes is also a concern for people who have a weak disease-fighting (immune) system. What are the causes? This condition is caused by the herpes simplex virus (HSV) type 1 or type 2. The virus may spread through:  Sexual contact with an infected person, including vaginal, anal, and oral sex.  Contact with fluid from a herpes sore.  The skin. This means that you can get herpes from an infected partner even if he or she does not have a visible sore or does not know that he or she is infected.  What increases the risk? You are more likely to develop this condition if:  You have sex with many partners.  You do not use latex condoms during sex.  What are the signs or symptoms? Most people do not have symptoms (asymptomatic) or have mild symptoms that may be mistaken for other skin problems. Symptoms may include:  Small red bumps near the genitals, rectum, or mouth. These bumps turn into blisters and then turn into sores.  Flu-like symptoms, including: ? Fever. ? Body aches. ? Swollen lymph nodes. ? Headache.  Painful  urination.  Pain and itching in the genital area or rectal area.  Vaginal discharge.  Tingling or shooting pain in the legs and buttocks.  Generally, symptoms are more severe and last longer during the first (primary) outbreak. Flu-like symptoms are also more common during the primary outbreak. How is this diagnosed? Genital herpes may be diagnosed based on:  A physical exam.  Your medical history.  Blood tests.  A test of a fluid sample (culture) from an open sore.  How is this treated? There is no cure for this condition, but treatment with antiviral medicines that are taken by mouth (orally) can do the following:  Speed up healing and relieve symptoms.  Help to reduce the spread of the virus to sexual partners.  Limit the chance of future outbreaks, or make future outbreaks shorter.  Lessen symptoms of future outbreaks.  Your health care provider may also recommend pain relief medicines, such as aspirin or ibuprofen. Follow these instructions at home: Sexual activity  Do not have sexual contact during active outbreaks.  Practice safe sex. Latex condoms and female condoms may help prevent the spread of the herpes virus. General instructions  Keep the affected areas dry and clean.  Take over-the-counter and prescription medicines only as told by your health care provider.  Avoid rubbing or touching blisters and sores. If you do touch blisters or sores: ? Wash your hands thoroughly with soap and water. ? Do not touch your eyes afterward.  To help relieve pain or itching, you may take the following actions as directed by your health care provider: ? Apply a cold, wet cloth (cold compress) to affected areas 4-6 times a day. ? Apply a substance that protects your skin and reduces bleeding (astringent). ? Apply a gel that helps relieve pain around sores (lidocaine gel). ? Take a warm, shallow bath that cleans the genital area (sitz bath).  Keep all follow-up visits  as told by your health care provider. This is important. How is this prevented?  Use condoms. Although anyone can get genital herpes during sexual contact, even with the use of a condom, a condom can provide some protection.  Avoid having multiple sexual partners.  Talk with your sexual partner about any symptoms either of you may have. Also, talk with your partner about any history of STIs.  Get tested for STIs before you have sex. Ask your partner to do the same.  Do not have sexual contact if you have symptoms of genital herpes. Contact a health care provider if:  Your symptoms are not improving with medicine.  Your symptoms return.  You have new symptoms.  You have a fever.  You have abdominal pain.  You have redness, swelling, or pain in your eye.  You notice new sores on other parts of your body.  You are a woman and experience bleeding between menstrual periods.  You have had herpes and you become pregnant or plan to become pregnant. Summary  Genital herpes is a common sexually transmitted infection (STI) that is caused by the herpes simplex virus (HSV) type 1 or type 2.  These viruses are most often spread through sexual contact with an infected person.  You are more likely to develop this condition if you have sex with many partners or you have unprotected sex.  Most people do not have symptoms (asymptomatic) or have mild symptoms that may be mistaken for other skin problems. Symptoms occur as outbreaks that may happen months or years apart.  There is no cure for this condition, but treatment with oral antiviral medicines can reduce symptoms, reduce the chance of spreading the virus to a partner, prevent future outbreaks, or shorten future outbreaks. This information is not intended to replace advice given to you by your health care provider. Make sure you discuss any questions you have with your health care provider. Document Released: 05/01/2000 Document Revised:  04/03/2016 Document Reviewed: 04/03/2016 Elsevier Interactive Patient Education  [employer].

## 2017-11-09 NOTE — Progress Notes (Signed)
C/o wanted to find out the gender today through u/s she thought.  Also wants results from last visit.rj

## 2017-11-21 ENCOUNTER — Encounter: Payer: Self-pay | Admitting: Emergency Medicine

## 2017-11-21 DIAGNOSIS — R770 Abnormality of albumin: Secondary | ICD-10-CM | POA: Insufficient documentation

## 2017-11-21 DIAGNOSIS — Z87891 Personal history of nicotine dependence: Secondary | ICD-10-CM | POA: Insufficient documentation

## 2017-11-21 DIAGNOSIS — J45909 Unspecified asthma, uncomplicated: Secondary | ICD-10-CM | POA: Diagnosis not present

## 2017-11-21 DIAGNOSIS — R1032 Left lower quadrant pain: Secondary | ICD-10-CM | POA: Insufficient documentation

## 2017-11-21 LAB — CBC
HEMATOCRIT: 31.7 % — AB (ref 35.0–47.0)
Hemoglobin: 11 g/dL — ABNORMAL LOW (ref 12.0–16.0)
MCH: 29.6 pg (ref 26.0–34.0)
MCHC: 34.7 g/dL (ref 32.0–36.0)
MCV: 85.4 fL (ref 80.0–100.0)
PLATELETS: 298 10*3/uL (ref 150–440)
RBC: 3.71 MIL/uL — AB (ref 3.80–5.20)
RDW: 13 % (ref 11.5–14.5)
WBC: 9.3 10*3/uL (ref 3.6–11.0)

## 2017-11-21 NOTE — ED Triage Notes (Signed)
Patient states that she is [redacted] weeks pregnant and that she started having left lower abdominal pain with nausea about an hour ago. Patient denies vaginal bleeding.

## 2017-11-22 ENCOUNTER — Emergency Department
Admission: EM | Admit: 2017-11-22 | Discharge: 2017-11-22 | Disposition: A | Discharge status: Home / Self Care | Payer: Medicaid Other | Attending: Emergency Medicine | Admitting: Emergency Medicine

## 2017-11-22 ENCOUNTER — Emergency Department: Payer: Medicaid Other

## 2017-11-22 DIAGNOSIS — R1032 Left lower quadrant pain: Secondary | ICD-10-CM

## 2017-11-22 DIAGNOSIS — E8809 Other disorders of plasma-protein metabolism, not elsewhere classified: Secondary | ICD-10-CM

## 2017-11-22 LAB — COMPREHENSIVE METABOLIC PANEL
ALT: 13 U/L (ref 0–44)
AST: 20 U/L (ref 15–41)
Albumin: 3.3 g/dL — ABNORMAL LOW (ref 3.5–5.0)
Alkaline Phosphatase: 45 U/L (ref 38–126)
Anion gap: 10 (ref 5–15)
BILIRUBIN TOTAL: 0.3 mg/dL (ref 0.3–1.2)
BUN: 8 mg/dL (ref 6–20)
CO2: 24 mmol/L (ref 22–32)
CREATININE: 0.64 mg/dL (ref 0.44–1.00)
Calcium: 9.5 mg/dL (ref 8.9–10.3)
Chloride: 104 mmol/L (ref 98–111)
Glucose, Bld: 90 mg/dL (ref 70–99)
POTASSIUM: 3.2 mmol/L — AB (ref 3.5–5.1)
Sodium: 138 mmol/L (ref 135–145)
TOTAL PROTEIN: 6.6 g/dL (ref 6.5–8.1)

## 2017-11-22 LAB — URINALYSIS, COMPLETE (UACMP) WITH MICROSCOPIC
BILIRUBIN URINE: NEGATIVE
Glucose, UA: NEGATIVE mg/dL
HGB URINE DIPSTICK: NEGATIVE
KETONES UR: NEGATIVE mg/dL
LEUKOCYTES UA: NEGATIVE
NITRITE: NEGATIVE
PH: 8 (ref 5.0–8.0)
Protein, ur: NEGATIVE mg/dL
SPECIFIC GRAVITY, URINE: 1.01 (ref 1.005–1.030)

## 2017-11-22 LAB — PREGNANCY, URINE: PREG TEST UR: POSITIVE — AB

## 2017-11-22 LAB — HCG, QUANTITATIVE, PREGNANCY: HCG, BETA CHAIN, QUANT, S: 27472 m[IU]/mL — AB (ref <5)

## 2017-11-22 MED ORDER — METOCLOPRAMIDE HCL 10 MG PO TABS: 10.0000 mg | ORAL_TABLET | Freq: Three times a day (TID) | ORAL | 0 refills | Status: DC

## 2017-11-22 NOTE — ED Provider Notes (Signed)
[facility] Emergency Department Provider Note  ____________________________________________   First MD Initiated Contact with Patient 11/22/17 0105     (approximate)  I have reviewed the triage vital signs and the nursing notes.   HISTORY  Chief Complaint Abdominal Pain   HPI Dawn Meza is a 22 y.o. female who self presents to the emergency department with left lower quadrant abdominal pain and nausea for the past 1 hour.  She is roughly [redacted] weeks pregnant.  She is had no vaginal discharge.  No dysuria frequency or hesitancy.  No back pain.  The pain is mild to moderate cramping constant.  She has a remote appendectomy.  She is primarily concerned about the health of her fetus.  Nothing seems to make the symptoms better or worse.  No diarrhea.   The pain is nonradiating.   Past Medical History:  Diagnosis Date  . Anxiety   . Asthma   . Depression   . Suicide attempt [facility](HCC)     Patient Active Problem List   Diagnosis Date Noted  . Seasonal allergies 10/12/2017  . History of suicide attempt 08/27/2017  . Supervision of high risk pregnancy, antepartum 08/27/2017  . Adjustment disorder with mixed anxiety and depressed mood 06/17/2016    Past Surgical History:  Procedure Laterality Date  . APPENDECTOMY      Prior to Admission medications   Medication Sig Start Date End Date Taking? Authorizing Provider  Butalbital-APAP-Caffeine 50-325-40 MG capsule Take 1-2 capsules by mouth every 6 (six) hours as needed for headache. 10/12/17   Schuman, Jaquelyn Bitterhristanna R, MD  metoCLOPramide (REGLAN) 10 MG tablet Take 1 tablet (10 mg total) by mouth 3 (three) times daily with meals. 11/22/17 11/22/18  Merrily Brittleifenbark, Maryagnes Carrasco, MD    Allergies Patient has no known allergies.  Family History  Problem Relation Age of Onset  . Hypertension Mother   . Heart disease Maternal Grandfather     Social History Social History   Tobacco Use  . Smoking status: Former Games developermoker  .  Smokeless tobacco: Never Used  Substance Use Topics  . Alcohol use: No  . Drug use: Not Currently    Frequency: 7.0 times per week    Review of Systems Constitutional: No fever/chills Eyes: No visual changes. ENT: No sore throat. Cardiovascular: Denies chest pain. Respiratory: Denies shortness of breath. Gastrointestinal: Positive for abdominal pain.  Positive for nausea, no vomiting.  No diarrhea.  No constipation. Genitourinary: Negative for dysuria. Musculoskeletal: Negative for back pain. Skin: Negative for rash. Neurological: Negative for headaches, focal weakness or numbness.   ____________________________________________   PHYSICAL EXAM:  VITAL SIGNS: ED Triage Vitals  Enc Vitals Group     BP 11/21/17 2319 114/71     Pulse Rate 11/21/17 2319 78     Resp 11/21/17 2319 18     Temp 11/21/17 2319 (!) 97.5 F (36.4 C)     Temp Source 11/21/17 2319 Oral     SpO2 11/21/17 2319 100 %     Weight 11/21/17 2320 91 lb (41.3 kg)     Height 11/21/17 2320 5' (1.524 m)     Head Circumference --      Peak Flow --      Pain Score 11/21/17 2319 10     Pain Loc --      Pain Edu? --      Excl. in GC? --     Constitutional: Alert and oriented x4 somewhat anxious appearing but nontoxic no diaphoresis speaks full  clear sentences Eyes: PERRL EOMI. Head: Atraumatic. Nose: No congestion/rhinnorhea. Mouth/Throat: No trismus Neck: No stridor.   Cardiovascular: Normal rate, regular rhythm. Grossly normal heart sounds.  Good peripheral circulation. Respiratory: Normal respiratory effort.  No retractions. Lungs CTAB and moving good air Gastrointestinal: Gravid abdomen very mild left lower quadrant tenderness with no rebound or guarding no peritonitis no costovertebral tenderness Musculoskeletal: No lower extremity edema   Neurologic:  Normal speech and language. No gross focal neurologic deficits are appreciated. Skin:  Skin is warm, dry and intact. No rash noted. Psychiatric: Mildly  anxious appearing    ____________________________________________   DIFFERENTIAL includes but not limited to  Diverticulitis, ovarian torsion, ovarian cyst, urinary tract infection, pyelonephritis ____________________________________________   LABS (all labs ordered are listed, but only abnormal results are displayed)  Labs Reviewed  COMPREHENSIVE METABOLIC PANEL - Abnormal; Notable for the following components:      Result Value   Potassium 3.2 (*)    Albumin 3.3 (*)    All other components within normal limits  CBC - Abnormal; Notable for the following components:   RBC 3.71 (*)    Hemoglobin 11.0 (*)    HCT 31.7 (*)    All other components within normal limits  URINALYSIS, COMPLETE (UACMP) WITH MICROSCOPIC - Abnormal; Notable for the following components:   Color, Urine YELLOW (*)    APPearance CLOUDY (*)    Bacteria, UA MANY (*)    All other components within normal limits  HCG, QUANTITATIVE, PREGNANCY - Abnormal; Notable for the following components:   hCG, Beta Chain, Quant, S 27,472 (*)    All other components within normal limits  PREGNANCY, URINE - Abnormal; Notable for the following components:   Preg Test, Ur POSITIVE (*)    All other components within normal limits  POC URINE PREG, ED    Lab work reviewed me with asymptomatic bacteriuria __________________________________________  EKG   ____________________________________________  RADIOLOGY  Fetal ultrasound reviewed by me with no acute disease noted ____________________________________________   PROCEDURES  Procedure(s) performed: no  Procedures  Critical Care performed: no  ____________________________________________   INITIAL IMPRESSION / ASSESSMENT AND PLAN / ED COURSE  Pertinent labs & imaging results that were available during my care of the patient were reviewed by me and considered in my medical decision making (see chart for details).         ----------------------------------------- 2:31 AM on 11/22/2017 -----------------------------------------  The patient's symptoms are now resolved and her abdomen is benign.  Lab work is reassuring and her ultrasound of her fetus is normal.  Her symptoms began about an hour after eating to quesadillas and drinking a large glass of whole milk.  She self reports a lactose intolerance.  On blood work today she is noted to have low albumin and I have encouraged her to increase the amount of protein in her diet.  She is discharged home with OB follow-up and Reglan for home. ____________________________________________   FINAL CLINICAL IMPRESSION(S) / ED DIAGNOSES  Final diagnoses:  Left lower quadrant pain  Hypoalbuminemia      NEW MEDICATIONS STARTED DURING THIS VISIT:  Discharge Medication List as of 11/22/2017  2:32 AM    START taking these medications   Details  metoCLOPramide (REGLAN) 10 MG tablet Take 1 tablet (10 mg total) by mouth 3 (three) times daily with meals., Starting Mon 11/22/2017, Until Tue 11/22/2018, Print         Note:  This document was prepared using Dragon voice recognition software and  may include unintentional dictation errors.     Merrily Brittle, MD 11/24/17 1200

## 2017-11-22 NOTE — Discharge Instructions (Signed)
Fortunately today your blood work, your urine, and your ultrasound were reassuring.  Please take your nausea medication as needed and follow-up with your [facility] gynecologist as scheduled.  Return to the emergency department for any concerns whatsoever.  It was a pleasure to take care of you today, and thank you for coming to our emergency department.  If you have any questions or concerns before leaving please ask the nurse to grab me and I'm more than happy to go through your aftercare instructions again.  If you were prescribed any opioid pain medication today such as Norco, Vicodin, Percocet, morphine, hydrocodone, or oxycodone please make sure you do not drive when you are taking this medication as it can alter your ability to drive safely.  If you have any concerns once you are home that you are not improving or are in fact getting worse before you can make it to your follow-up appointment, please do not hesitate to call 911 and come back for further evaluation.  Merrily Brittle, MD  Results for orders placed or performed during the hospital encounter of 11/22/17  Comprehensive metabolic panel  Result Value Ref Range   Sodium 138 135 - 145 mmol/L   Potassium 3.2 (L) 3.5 - 5.1 mmol/L   Chloride 104 98 - 111 mmol/L   CO2 24 22 - 32 mmol/L   Glucose, Bld 90 70 - 99 mg/dL   BUN 8 6 - 20 mg/dL   Creatinine, Ser 1.61 0.44 - 1.00 mg/dL   Calcium 9.5 8.9 - 09.6 mg/dL   Total Protein 6.6 6.5 - 8.1 g/dL   Albumin 3.3 (L) 3.5 - 5.0 g/dL   AST 20 15 - 41 U/L   ALT 13 0 - 44 U/L   Alkaline Phosphatase 45 38 - 126 U/L   Total Bilirubin 0.3 0.3 - 1.2 mg/dL   GFR calc non Af Amer >60 >60 mL/min   GFR calc Af Amer >60 >60 mL/min   Anion gap 10 5 - 15  CBC  Result Value Ref Range   WBC 9.3 3.6 - 11.0 K/uL   RBC 3.71 (L) 3.80 - 5.20 MIL/uL   Hemoglobin 11.0 (L) 12.0 - 16.0 g/dL   HCT 04.5 (L) 40.9 - 81.1 %   MCV 85.4 80.0 - 100.0 fL   MCH 29.6 26.0 - 34.0 pg   MCHC 34.7 32.0 - 36.0 g/dL   RDW  91.4 78.2 - 95.6 %   Platelets 298 150 - 440 K/uL  Urinalysis, Complete w Microscopic  Result Value Ref Range   Color, Urine YELLOW (A) YELLOW   APPearance CLOUDY (A) CLEAR   Specific Gravity, Urine 1.010 1.005 - 1.030   pH 8.0 5.0 - 8.0   Glucose, UA NEGATIVE NEGATIVE mg/dL   Hgb urine dipstick NEGATIVE NEGATIVE   Bilirubin Urine NEGATIVE NEGATIVE   Ketones, ur NEGATIVE NEGATIVE mg/dL   Protein, ur NEGATIVE NEGATIVE mg/dL   Nitrite NEGATIVE NEGATIVE   Leukocytes, UA NEGATIVE NEGATIVE   RBC / HPF 0-5 0 - 5 RBC/hpf   WBC, UA 6-10 0 - 5 WBC/hpf   Bacteria, UA MANY (A) NONE SEEN   Squamous Epithelial / LPF 11-20 0 - 5   Amorphous Crystal PRESENT   hCG, quantitative, pregnancy  Result Value Ref Range   hCG, Beta Chain, Quant, S 27,472 (H) <5 mIU/mL  Pregnancy, urine  Result Value Ref Range   Preg Test, Ur POSITIVE (A) NEGATIVE   US Ob Limited > 14 Wks  Result Date:  11/22/2017 CLINICAL DATA:  Pregnant patient in second trimester pregnancy with abdominal pain. EXAM: LIMITED OBSTETRIC ULTRASOUND FINDINGS: Number of Fetuses: 1 Heart Rate:  153 bpm Movement: Yes Presentation: Cephalic Placental Location: Anterior Previa: No Amniotic Fluid (Subjective):  Within normal limits. BPD: 4.15 cm 18 w  4 d MATERNAL FINDINGS: Cervix:  Appears closed. Uterus/Adnexae: No abnormality visualized. Left ovary is visualized and normal. Right ovary not definitively seen. No pelvic free fluid. IMPRESSION: Single live intrauterine pregnancy estimated gestational age [redacted] weeks 4 days based on biparietal diameter. No complications visualized. This exam is performed on an emergent basis and does not comprehensively evaluate fetal size, dating, or anatomy; follow-up complete OB US should be considered if further fetal assessment is warranted. Electronically Signed   By: Rubye OaksMelanie  Ehinger M.D.   On: 11/22/2017 01:58

## 2017-11-23 ENCOUNTER — Ambulatory Visit (INDEPENDENT_AMBULATORY_CARE_PROVIDER_SITE_OTHER): Payer: BLUE CROSS/BLUE SHIELD

## 2017-11-23 ENCOUNTER — Ambulatory Visit (INDEPENDENT_AMBULATORY_CARE_PROVIDER_SITE_OTHER): Payer: BLUE CROSS/BLUE SHIELD | Admitting: Maternal Newborn

## 2017-11-23 VITALS — BP 100/60 | Wt 89.0 lb

## 2017-11-23 DIAGNOSIS — O0990 Supervision of high risk pregnancy, unspecified, unspecified trimester: Secondary | ICD-10-CM

## 2017-11-23 DIAGNOSIS — Z363 Encounter for antenatal screening for malformations: Secondary | ICD-10-CM | POA: Diagnosis not present

## 2017-11-23 DIAGNOSIS — Z3A18 18 weeks gestation of pregnancy: Secondary | ICD-10-CM

## 2017-11-23 DIAGNOSIS — O358XX0 Maternal care for other (suspected) fetal abnormality and damage, not applicable or unspecified: Secondary | ICD-10-CM

## 2017-11-23 DIAGNOSIS — Z113 Encounter for screening for infections with a predominantly sexual mode of transmission: Secondary | ICD-10-CM

## 2017-11-23 NOTE — Progress Notes (Signed)
Routine Prenatal Care Visit  Subjective  Dawn Meza is a 22 y.o. G1P0 at [redacted]w[redacted]d being seen today for ongoing prenatal care.  She is currently monitored for the following issues for this high-risk pregnancy and has Adjustment disorder with mixed anxiety and depressed mood; History of suicide attempt; Supervision of high risk pregnancy, antepartum; and Seasonal allergies on their problem list.  ----------------------------------------------------------------------------------- Patient reports some pain in her left lower quadrant. She was seen in the ED for this and no cause was found. The pain has since resolved.   Contractions: Irregular. Vag. Bleeding: None.  ----------------------------------------------------------------------------------- The following portions of the patient's history were reviewed and updated as appropriate: allergies, current medications, past family history, past medical history, past social history, past surgical history and problem list. Problem list updated.   Objective  Blood pressure 100/60, weight 89 lb (40.4 kg), last menstrual period 07/19/2017. Pregravid weight 88 lb (39.9 kg) Total Weight Gain 1 lb (0.454 kg) Urinalysis: Urine Protein: 1+ Urine Glucose: Negative  Fetal Status: Fetal Heart Rate (bpm): 153   Movement: Present     General:  Alert, oriented and cooperative. Patient is in no acute distress.  Skin: Skin is warm and dry. No rash noted.   Cardiovascular: Normal heart rate noted  Respiratory: Normal respiratory effort, no problems with respiration noted  Abdomen: Soft, gravid, appropriate for gestational age. Pain/Pressure: Present     Pelvic:  Cervical exam deferred        Extremities: Normal range of motion.  Edema: None  Mental Status: Normal mood and affect. Normal behavior. Normal judgment and thought content.     Assessment   22 y.o. G1P0 at [redacted]w[redacted]d, EDD 04/25/2018 by Last Menstrual Period presenting for routine prenatal  visit.  Plan   pregnancy Problems (from 08/27/17 to present)    Problem Noted Resolved   History of suicide attempt 08/27/2017 by Tresea Mall, CNM No   Supervision of high risk pregnancy, antepartum 08/27/2017 by Tresea Mall, CNM No   Overview Addendum 10/12/2017 11:20 AM by Natale Milch, MD    [facility]inic [facility] Prenatal Labs  Dating LMP = 8wk [place] Blood type:     Genetic Screen 1 Screen: Collected 5/28     AFP:          Antibody:   Anatomic [place]  Rubella:   Varicella: @VZVIGG @  GTT Early:               Third trimester:  RPR:     Rhogam  HBsAg:     TDaP vaccine                       Flu Shot: HIV:     Baby Food                                GBS:   Contraception  Pap:  CBB     CS/VBAC NA   Support Person Mom- Robin           Adjustment disorder with mixed anxiety and depressed mood 06/17/2016 by Kerry Hough, PA-C No    Anatomy scan complete today, it's a girl! 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 Discussed findings with patient.  She is concerned about the positive HSV swab results and would like to be tested for antibodies, as well as for HIV. Advised that she wait two weeks for  testing based on timing of previous tests.  Gestational age appropriate obstetric precautions were reviewed.  Return in two weeks for lab only visit for HIV and HSV testing.  Return in about 1 month (around 12/21/2017) for ROB.  Marcelyn BruinsJacelyn Schmid, CNM 11/24/2017  8:19 AM

## 2017-11-24 ENCOUNTER — Encounter: Payer: Self-pay | Admitting: Maternal Newborn

## 2017-12-07 ENCOUNTER — Other Ambulatory Visit: Payer: BLUE CROSS/BLUE SHIELD

## 2017-12-07 DIAGNOSIS — Z113 Encounter for screening for infections with a predominantly sexual mode of transmission: Secondary | ICD-10-CM

## 2017-12-10 LAB — HIV ANTIBODY (ROUTINE TESTING W REFLEX): HIV Screen 4th Generation wRfx: NONREACTIVE

## 2017-12-10 LAB — HSV(HERPES SMPLX)ABS-I+II(IGG+IGM)-BLD
HSV 1 GLYCOPROTEIN G AB, IGG: 34.3 {index} — AB (ref 0.00–0.90)
HSVI/II COMB AB IGM: 1.01 ratio — AB (ref 0.00–0.90)

## 2017-12-21 ENCOUNTER — Ambulatory Visit (INDEPENDENT_AMBULATORY_CARE_PROVIDER_SITE_OTHER): Payer: BLUE CROSS/BLUE SHIELD | Admitting: Certified Nurse Midwife

## 2017-12-21 VITALS — BP 98/60 | Wt 92.0 lb

## 2017-12-21 DIAGNOSIS — Z3A22 22 weeks gestation of pregnancy: Secondary | ICD-10-CM

## 2017-12-21 DIAGNOSIS — F4323 Adjustment disorder with mixed anxiety and depressed mood: Secondary | ICD-10-CM

## 2017-12-21 DIAGNOSIS — O0990 Supervision of high risk pregnancy, unspecified, unspecified trimester: Secondary | ICD-10-CM

## 2017-12-21 DIAGNOSIS — O09899 Supervision of other high risk pregnancies, unspecified trimester: Secondary | ICD-10-CM | POA: Insufficient documentation

## 2017-12-21 HISTORY — DX: Supervision of other high risk pregnancies, unspecified trimester: O09.899

## 2017-12-21 NOTE — Progress Notes (Signed)
HROB at 22wk1d: Taking prenatal Gummies with DHA/EPA. Eats little red meat. Protein with chicken, coconut milk, seafood, yogurt. [facility] Reports stopping Zoloft: did not like the way it made her feel. Feeling more depressed the last few days. Given numbers [facility]manda [facility] [employer]isites for Sales executivedental assistant Having a tooth pulled tomorrow and has had cavities filled. Feeling baby moving. ROB in 4 weeks. Farrel Connersolleen Carrieann Spielberg, CNM

## 2018-01-18 ENCOUNTER — Encounter: Payer: BLUE CROSS/BLUE SHIELD | Admitting: Maternal Newborn

## 2018-01-19 ENCOUNTER — Other Ambulatory Visit (HOSPITAL_COMMUNITY)
Admission: RE | Admit: 2018-01-19 | Discharge: 2018-01-19 | Disposition: A | Discharge status: Home / Self Care | Payer: Medicaid Other | Source: Ambulatory Visit | Attending: Obstetrics and Gynecology | Admitting: Obstetrics and Gynecology

## 2018-01-19 ENCOUNTER — Ambulatory Visit (INDEPENDENT_AMBULATORY_CARE_PROVIDER_SITE_OTHER): Payer: Self-pay | Admitting: Obstetrics and Gynecology

## 2018-01-19 ENCOUNTER — Encounter: Payer: Self-pay | Admitting: Obstetrics and Gynecology

## 2018-01-19 VITALS — BP 104/70 | Wt 97.0 lb

## 2018-01-19 DIAGNOSIS — Z113 Encounter for screening for infections with a predominantly sexual mode of transmission: Secondary | ICD-10-CM

## 2018-01-19 DIAGNOSIS — O26842 Uterine size-date discrepancy, second trimester: Secondary | ICD-10-CM

## 2018-01-19 DIAGNOSIS — Z23 Encounter for immunization: Secondary | ICD-10-CM

## 2018-01-19 DIAGNOSIS — F329 Major depressive disorder, single episode, unspecified: Secondary | ICD-10-CM

## 2018-01-19 DIAGNOSIS — O99342 Other mental disorders complicating pregnancy, second trimester: Secondary | ICD-10-CM | POA: Insufficient documentation

## 2018-01-19 DIAGNOSIS — Z3A26 26 weeks gestation of pregnancy: Secondary | ICD-10-CM

## 2018-01-19 DIAGNOSIS — F32A Depression, unspecified: Secondary | ICD-10-CM | POA: Insufficient documentation

## 2018-01-19 DIAGNOSIS — O09899 Supervision of other high risk pregnancies, unspecified trimester: Secondary | ICD-10-CM

## 2018-01-19 DIAGNOSIS — O0990 Supervision of high risk pregnancy, unspecified, unspecified trimester: Secondary | ICD-10-CM | POA: Diagnosis present

## 2018-01-19 HISTORY — DX: Uterine size-date discrepancy, second trimester: O26.842

## 2018-01-19 NOTE — Progress Notes (Signed)
Routine Prenatal Care Visit  Subjective  Dawn Meza is a 22 y.o. G1P0 at [redacted]w[redacted]d being seen today for ongoing prenatal care.  She is currently monitored for the following issues for this high-risk pregnancy and has Adjustment disorder with mixed anxiety and depressed mood; History of suicide attempt; Supervision of high risk pregnancy, antepartum; Seasonal allergies; Two vessel umbilical cord, antepartum, single gestation; and Depression affecting pregnancy in second trimester, antepartum on their problem list.  ----------------------------------------------------------------------------------- Patient reports backache.   Contractions: Not present. Vag. Bleeding: None.  Movement: Present. Denies leaking of fluid.  ----------------------------------------------------------------------------------- The following portions of the patient's history were reviewed and updated as appropriate: allergies, current medications, past family history, past medical history, past social history, past surgical history and problem list. Problem list updated.   Objective  Blood pressure 104/70, weight 97 lb (44 kg), last menstrual period 07/19/2017. Pregravid weight 88 lb (39.9 kg) Total Weight Gain 9 lb (4.082 kg) Urinalysis:      Fetal Status: Fetal Heart Rate (bpm): 156 Fundal Height: 23 cm Movement: Present     General:  Alert, oriented and cooperative. Patient is in no acute distress.  Skin: Skin is warm and dry. No rash noted.   Cardiovascular: Normal heart rate noted  Respiratory: Normal respiratory effort, no problems with respiration noted  Abdomen: Soft, gravid, appropriate for gestational age. Pain/Pressure: Present     Pelvic:  Cervical exam deferred        Extremities: Normal range of motion.  Edema: None  ental Status: Normal mood and affect. Normal behavior. Normal judgment and thought content.     Assessment   23 y.o. G1P0 at [redacted]w[redacted]d by  04/25/2018, by Last Menstrual Period presenting  for routine prenatal visit  Plan   pregnancy Problems (from 08/27/17 to present)    Problem Noted Resolved   Depression affecting pregnancy in second trimester, antepartum 01/19/2018 by Natale Milch, MD No   Two vessel umbilical cord, antepartum, single gestation 12/21/2017 by Farrel Conners, CNM No   Overview Signed 12/21/2017 12:51 PM by Farrel Conners, CNM    Growth scans in third trimester every 4 weeks.      History of suicide attempt 08/27/2017 by Tresea Mall, CNM No   Supervision of high risk pregnancy, antepartum 08/27/2017 by Tresea Mall, CNM No   Overview Addendum 01/19/2018 12:46 PM by Natale Milch, MD    [facility]inic [facility] Prenatal Labs  Dating LMP = 8wk [place] Blood type: A/Positive/-- (05/28 1141)   Genetic Screen 1 Screen: Collected 5/28     AFP:          Antibody:Negative (05/28 1141)  Anatomic [place] 2 vessel cord, choroid plexus cysts Rubella: 4.02 (05/28 1141) Varicella: Immune  GTT Early:                Third trimester:  RPR: Non Reactive (05/28 1141)   Rhogam  not needed HBsAg: Negative (05/28 1141)   TDaP vaccine                        Flu Shot: HIV: Non Reactive (05/28 1141)   Baby Food                                GBS:   Contraception  Pap: NEEDS PAP POSTPARTUM  CBB     CS/VBAC NA   Support Person Mom- Zella Ball  Adjustment disorder with mixed anxiety and depressed mood 06/17/2016 by Kerry Hough, PA-C No       Gestational age appropriate obstetric precautions including but not limited to vaginal bleeding, contractions, leaking of fluid and fetal movement were reviewed in detail with the patient.     Urine aptima, was with FOB again, requested STD screening.  Growth [place] at next visit. Uterine size date discrepancy. She is with [facility] . Has gained 9 lbs so far this pregnancy, 5lbs since her last visit.  Flu shot today. Given information on birth control postpartum. Says she doesn't think she will need it because she isn't really  sexually active right now.   Return in about 2 weeks (around 02/02/2018) for ROB, [place], 1GTT.   Adelene Idler MD [facility], [facility] 01/19/18 2:49 PM

## 2018-01-19 NOTE — Progress Notes (Signed)
ROB C/o irritation in nose that makes her bleed.

## 2018-01-20 LAB — CERVICOVAGINAL ANCILLARY ONLY
CHLAMYDIA, DNA PROBE: NEGATIVE
NEISSERIA GONORRHEA: NEGATIVE

## 2018-01-21 NOTE — Progress Notes (Signed)
Negative, Called and discussed with patient

## 2018-02-02 ENCOUNTER — Ambulatory Visit (INDEPENDENT_AMBULATORY_CARE_PROVIDER_SITE_OTHER): Payer: Medicaid Other | Admitting: Maternal Newborn

## 2018-02-02 ENCOUNTER — Other Ambulatory Visit: Payer: Medicaid Other

## 2018-02-02 ENCOUNTER — Encounter: Payer: Self-pay | Admitting: Maternal Newborn

## 2018-02-02 ENCOUNTER — Ambulatory Visit (INDEPENDENT_AMBULATORY_CARE_PROVIDER_SITE_OTHER): Payer: Medicaid Other

## 2018-02-02 VITALS — BP 120/70 | Wt 101.0 lb

## 2018-02-02 DIAGNOSIS — O09899 Supervision of other high risk pregnancies, unspecified trimester: Secondary | ICD-10-CM

## 2018-02-02 DIAGNOSIS — Z3A28 28 weeks gestation of pregnancy: Secondary | ICD-10-CM

## 2018-02-02 DIAGNOSIS — O0990 Supervision of high risk pregnancy, unspecified, unspecified trimester: Secondary | ICD-10-CM

## 2018-02-02 DIAGNOSIS — O26843 Uterine size-date discrepancy, third trimester: Secondary | ICD-10-CM

## 2018-02-02 DIAGNOSIS — O358XX0 Maternal care for other (suspected) fetal abnormality and damage, not applicable or unspecified: Secondary | ICD-10-CM

## 2018-02-02 LAB — POCT URINALYSIS DIPSTICK OB
Glucose, UA: NEGATIVE
PROTEIN: NEGATIVE

## 2018-02-02 NOTE — Progress Notes (Signed)
C/o mild cramps; constipation - no BM since Monday, ? Toe [email or web address]

## 2018-02-02 NOTE — Patient Instructions (Signed)
Third Trimester of Pregnancy The third trimester is from week 28 through week 40 (months 7 through 9). The third trimester is a time when the unborn baby (fetus) is growing rapidly. At the end of the ninth month, the fetus is about 20 inches in length and weighs 6-10 pounds. Body changes during your third trimester Your body will continue to go through many changes during pregnancy. The changes vary from woman to woman. During the third trimester:  Your weight will continue to increase. You can expect to gain 25-35 pounds (11-16 kg) by the end of the pregnancy.  You may begin to get stretch marks on your hips, abdomen, and breasts.  You may urinate more often because the fetus is moving lower into your pelvis and pressing on your bladder.  You may develop or continue to have heartburn. This is caused by increased hormones that slow down muscles in the digestive tract.  You may develop or continue to have constipation because increased hormones slow digestion and cause the muscles that push waste through your intestines to relax.  You may develop hemorrhoids. These are swollen veins (varicose veins) in the rectum that can itch or be painful.  You may develop swollen, bulging veins (varicose veins) in your legs.  You may have increased body aches in the pelvis, back, or thighs. This is due to weight gain and increased hormones that are relaxing your joints.  You may have changes in your hair. These can include thickening of your hair, rapid growth, and changes in texture. Some women also have hair loss during or after pregnancy, or hair that feels dry or thin. Your hair will most likely return to normal after your baby is born.  Your breasts will continue to grow and they will continue to become tender. A yellow fluid (colostrum) may leak from your breasts. This is the first milk you are producing for your baby.  Your belly button may stick out.  You may notice more swelling in your hands,  face, or ankles.  You may have increased tingling or numbness in your hands, arms, and legs. The skin on your belly may also feel numb.  You may feel short of breath because of your expanding uterus.  You may have more problems sleeping. This can be caused by the size of your belly, increased need to urinate, and an increase in your body's metabolism.  You may notice the fetus "dropping," or moving lower in your abdomen (lightening).  You may have increased vaginal discharge.  You may notice your joints feel loose and you may have pain around your pelvic bone.  What to expect at prenatal visits You will have prenatal exams every 2 weeks until week 36. Then you will have weekly prenatal exams. During a routine prenatal visit:  You will be weighed to make sure you and the baby are growing normally.  Your blood pressure will be taken.  Your abdomen will be measured to track your baby's growth.  The fetal heartbeat will be listened to.  Any test results from the previous visit will be discussed.  You may have a cervical check near your due date to see if your cervix has softened or thinned (effaced).  You will be tested for Group B streptococcus. This happens between 35 and 37 weeks.  Your health care provider may ask you:  What your birth plan is.  How you are feeling.  If you are feeling the baby move.  If you have had   any abnormal symptoms, such as leaking fluid, bleeding, severe headaches, or abdominal cramping.  If you are using any tobacco products, including cigarettes, chewing tobacco, and electronic cigarettes.  If you have any questions.  Other tests or screenings that may be performed during your third trimester include:  Blood tests that check for low iron levels (anemia).  Fetal testing to check the health, activity level, and growth of the fetus. Testing is done if you have certain medical conditions or if there are problems during the  pregnancy.  Nonstress test (NST). This test checks the health of your baby to make sure there are no signs of problems, such as the baby not getting enough oxygen. During this test, a belt is placed around your belly. The baby is made to move, and its heart rate is monitored during movement.  What is false labor? False labor is a condition in which you feel small, irregular tightenings of the muscles in the womb (contractions) that usually go away with rest, changing position, or drinking water. These are called Braxton Hicks contractions. Contractions may last for hours, days, or even weeks before true labor sets in. If contractions come at regular intervals, become more frequent, increase in intensity, or become painful, you should see your health care provider. What are the signs of labor?  Abdominal cramps.  Regular contractions that start at 10 minutes apart and become stronger and more frequent with time.  Contractions that start on the top of the uterus and spread down to the lower abdomen and back.  Increased pelvic pressure and dull back pain.  A watery or bloody mucus discharge that comes from the vagina.  Leaking of amniotic fluid. This is also known as your "water breaking." It could be a slow trickle or a gush. Let your health care provider know if it has a color or strange odor. If you have any of these signs, call your health care provider right away, even if it is before your due date. Follow these instructions at home: Medicines  Follow your health care provider's instructions regarding medicine use. Specific medicines may be either safe or unsafe to take during pregnancy.  Take a prenatal vitamin that contains at least 600 micrograms (mcg) of folic acid.  If you develop constipation, try taking a stool softener if your health care provider approves. Eating and drinking  Eat a balanced diet that includes fresh fruits and vegetables, whole grains, good sources of protein  such as meat, eggs, or tofu, and low-fat dairy. Your health care provider will help you determine the amount of weight gain that is right for you.  Avoid raw meat and uncooked cheese. These carry germs that can cause birth defects in the baby.  If you have low calcium intake from food, talk to your health care provider about whether you should take a daily calcium supplement.  Eat four or five small meals rather than three large meals a day.  Limit foods that are high in fat and processed sugars, such as fried and sweet foods.  To prevent constipation: ? Drink enough fluid to keep your urine clear or pale yellow. ? Eat foods that are high in fiber, such as fresh fruits and vegetables, whole grains, and beans. Activity  Exercise only as directed by your health care provider. Most women can continue their usual exercise routine during pregnancy. Try to exercise for 30 minutes at least 5 days a week. Stop exercising if you experience uterine contractions.  Avoid heavy   lifting.  Do not exercise in extreme heat or humidity, or at high altitudes.  Wear low-heel, comfortable shoes.  Practice good posture.  You may continue to have sex unless your health care provider tells you otherwise. Relieving pain and discomfort  Take frequent breaks and rest with your legs elevated if you have leg cramps or low back pain.  Take warm sitz baths to soothe any pain or discomfort caused by hemorrhoids. Use hemorrhoid cream if your health care provider approves.  Wear a good support bra to prevent discomfort from breast tenderness.  If you develop varicose veins: ? Wear support pantyhose or compression stockings as told by your healthcare provider. ? Elevate your feet for 15 minutes, 3-4 times a day. Prenatal care  Write down your questions. Take them to your prenatal visits.  Keep all your prenatal visits as told by your health care provider. This is important. Safety  Wear your seat belt at  all times when driving.  Make a list of emergency phone numbers, including numbers for family, friends, the hospital, and police and fire departments. General instructions  Avoid cat litter boxes and soil used by cats. These carry germs that can cause birth defects in the baby. If you have a cat, ask someone to clean the litter box for you.  Do not travel far distances unless it is absolutely necessary and only with the approval of your health care provider.  Do not use hot tubs, steam rooms, or saunas.  Do not drink alcohol.  Do not use any products that contain nicotine or tobacco, such as cigarettes and e-cigarettes. If you need help quitting, ask your health care provider.  Do not use any medicinal herbs or unprescribed drugs. These chemicals affect the formation and growth of the baby.  Do not douche or use tampons or scented sanitary pads.  Do not cross your legs for long periods of time.  To prepare for the arrival of your baby: ? Take prenatal classes to understand, practice, and ask questions about labor and delivery. ? Make a trial run to the hospital. ? Visit the hospital and tour the maternity area. ? Arrange for maternity or paternity leave through employers. ? Arrange for family and friends to take care of pets while you are in the hospital. ? Purchase a rear-facing car seat and make sure you know how to install it in your car. ? Pack your hospital bag. ? Prepare the baby's nursery. Make sure to remove all pillows and stuffed animals from the baby's crib to prevent suffocation.  Visit your dentist if you have not gone during your pregnancy. Use a soft toothbrush to brush your teeth and be gentle when you floss. Contact a health care provider if:  You are unsure if you are in labor or if your water has broken.  You become dizzy.  You have mild pelvic cramps, pelvic pressure, or nagging pain in your abdominal area.  You have lower back pain.  You have persistent  nausea, vomiting, or diarrhea.  You have an unusual or bad smelling vaginal discharge.  You have pain when you urinate. Get help right away if:  Your water breaks before 37 weeks.  You have regular contractions less than 5 minutes apart before 37 weeks.  You have a fever.  You are leaking fluid from your vagina.  You have spotting or bleeding from your vagina.  You have severe abdominal pain or cramping.  You have rapid weight loss or weight gain.    You have shortness of breath with chest pain.  You notice sudden or extreme swelling of your face, hands, ankles, feet, or legs.  Your baby makes fewer than 10 movements in 2 hours.  You have severe headaches that do not go away when you take medicine.  You have vision changes. Summary  The third trimester is from week 28 through week 40, months 7 through 9. The third trimester is a time when the unborn baby (fetus) is growing rapidly.  During the third trimester, your discomfort may increase as you and your baby continue to gain weight. You may have abdominal, leg, and back pain, sleeping problems, and an increased need to urinate.  During the third trimester your breasts will keep growing and they will continue to become tender. A yellow fluid (colostrum) may leak from your breasts. This is the first milk you are producing for your baby.  False labor is a condition in which you feel small, irregular tightenings of the muscles in the womb (contractions) that eventually go away. These are called Braxton Hicks contractions. Contractions may last for hours, days, or even weeks before true labor sets in.  Signs of labor can include: abdominal cramps; regular contractions that start at 10 minutes apart and become stronger and more frequent with time; watery or bloody mucus discharge that comes from the vagina; increased pelvic pressure and dull back pain; and leaking of amniotic fluid. This information is not intended to replace advice  given to you by your health care provider. Make sure you discuss any questions you have with your health care provider. Document Released: 04/28/2001 Document Revised: 10/10/2015 Document Reviewed: 07/05/2012 [employer] Interactive Patient Education  2017 [employer]  

## 2018-02-02 NOTE — Progress Notes (Signed)
Routine Prenatal Care Visit  Subjective  Dawn Meza is a 22 y.o. G1P0 at 7115w2d being seen today for ongoing prenatal care.  She is currently monitored for the following issues for this high-risk pregnancy and has Adjustment disorder with mixed anxiety and depressed mood; History of suicide attempt; Supervision of high risk pregnancy, antepartum; Seasonal allergies; Two vessel umbilical cord, antepartum, single gestation; Depression affecting pregnancy in second trimester, antepartum; and Uterine size date discrepancy pregnancy, second trimester on their problem list.  ----------------------------------------------------------------------------------- Patient reports no complaints.   Contractions: Not present. Vag. Bleeding: None.  Movement: Present. No leaking of fluid.  ----------------------------------------------------------------------------------- The following portions of the patient's history were reviewed and updated as appropriate: allergies, current medications, past family history, past medical history, past social history, past surgical history and problem list. Problem list updated.   Objective  Blood pressure 120/70, weight 101 lb (45.8 kg), last menstrual period 07/19/2017. Pregravid weight 88 lb (39.9 kg) Total Weight Gain 13 lb (5.897 kg) Body mass index is 19.73 kg/m. Urinalysis: Protein Negative, Glucose Negative Fetal Status: Fetal Heart Rate (bpm): 156 Fundal Height: 26 cm Movement: Present     General:  Alert, oriented and cooperative. Patient is in no acute distress.  Skin: Skin is warm and dry. No rash noted.   Cardiovascular: Normal heart rate noted  Respiratory: Normal respiratory effort, no problems with respiration noted  Abdomen: Soft, gravid, appropriate for gestational age. Pain/Pressure: Absent     Pelvic:  Cervical exam deferred        Extremities: Normal range of motion.  Edema: None  Mental Status: Normal mood and affect. Normal behavior. Normal  judgment and thought content.    Assessment   22 y.o. G1P0 at 4315w2d, EDD 04/25/2018 by Last Menstrual Period presenting for a routine prenatal visit.  Plan   pregnancy Problems (from 08/27/17 to present)    Problem Noted Resolved   Depression affecting pregnancy in second trimester, antepartum 01/19/2018 by Natale MilchSchuman, Christanna R, MD No   Uterine size date discrepancy pregnancy, second trimester 01/19/2018 by Natale MilchSchuman, Christanna R, MD No   Two vessel umbilical cord, antepartum, single gestation 12/21/2017 by Farrel ConnersGutierrez, Colleen, CNM No   Overview Signed 12/21/2017 12:51 PM by Farrel ConnersGutierrez, Colleen, CNM    Growth scans in third trimester every 4 weeks.      History of suicide attempt 08/27/2017 by Tresea MallGledhill, Jane, CNM No   Supervision of high risk pregnancy, antepartum 08/27/2017 by Tresea MallGledhill, Jane, CNM No   Overview Addendum 01/19/2018  2:50 PM by Natale MilchSchuman, Christanna R, MD    Clinic [facility] Prenatal Labs  Dating LMP = 8wk US Blood type: A/Positive/-- (05/28 1141)   Genetic Screen 1 Screen: Collected 5/28     AFP:          Antibody:Negative (05/28 1141)  Anatomic US 2 vessel cord, choroid plexus cysts Rubella: 4.02 (05/28 1141) Varicella: Immune  GTT Early:                Third trimester:  RPR: Non Reactive (05/28 1141)   Rhogam  not needed HBsAg: Negative (05/28 1141)   TDaP vaccine                        Flu Shot: 01/19/18 HIV: Non Reactive (05/28 1141)   Baby Food   Breast  GBS:   Contraception  undecided, giveninformation Pap: NEEDS PAP POSTPARTUM  CBB     CS/VBAC NA   Support Person Mom- Zella Ball           Adjustment disorder with mixed anxiety and depressed mood 06/17/2016 by Kerry Hough, PA-C No    Growth ultrasound today. AFI 8.65, growth percentile 41.9%, EFW 2 lb 7 oz.   28 week labs/GTT today  She desires to transfer care to [facility] and deliver at [facility]. Will send referral.  Gestational age appropriate obstetric precautions were  reviewed.  Please refer to After Visit Summary for other counseling recommendations.   Return in about 2 weeks (around 02/16/2018) for ROB.  Marcelyn Bruins, CNM 02/02/2018  9:26 AM

## 2018-02-03 LAB — 28 WEEK RH+PANEL
Basophils Absolute: 0 10*3/uL (ref 0.0–0.2)
Basos: 0 %
EOS (ABSOLUTE): 0.1 10*3/uL (ref 0.0–0.4)
Eos: 1 %
Gestational Diabetes Screen: 144 mg/dL — ABNORMAL HIGH (ref 65–139)
HEMOGLOBIN: 11.3 g/dL (ref 11.1–15.9)
HIV SCREEN 4TH GENERATION: NONREACTIVE
Hematocrit: 34.1 % (ref 34.0–46.6)
IMMATURE GRANS (ABS): 0 10*3/uL (ref 0.0–0.1)
Immature Granulocytes: 0 %
LYMPHS ABS: 1.3 10*3/uL (ref 0.7–3.1)
Lymphs: 13 %
MCH: 28.9 pg (ref 26.6–33.0)
MCHC: 33.1 g/dL (ref 31.5–35.7)
MCV: 87 fL (ref 79–97)
MONOCYTES: 4 %
Monocytes Absolute: 0.4 10*3/uL (ref 0.1–0.9)
Neutrophils Absolute: 8.3 10*3/uL — ABNORMAL HIGH (ref 1.4–7.0)
Neutrophils: 82 %
PLATELETS: 316 10*3/uL (ref 150–450)
RBC: 3.91 x10E6/uL (ref 3.77–5.28)
RDW: 13.4 % (ref 12.3–15.4)
RPR Ser Ql: NONREACTIVE
WBC: 10 10*3/uL (ref 3.4–10.8)

## 2018-02-08 ENCOUNTER — Other Ambulatory Visit: Payer: Self-pay | Admitting: Obstetrics and Gynecology

## 2018-02-08 DIAGNOSIS — R7309 Other abnormal glucose: Secondary | ICD-10-CM

## 2018-02-08 NOTE — Progress Notes (Signed)
Discussed results with Racquel on the phone, she needs a 3hr gtt. She will call to schedule this test.

## 2018-02-14 DIAGNOSIS — A6004 Herpesviral vulvovaginitis: Secondary | ICD-10-CM | POA: Insufficient documentation

## 2018-02-14 DIAGNOSIS — A6009 Herpesviral infection of other urogenital tract: Secondary | ICD-10-CM | POA: Insufficient documentation

## 2018-02-14 DIAGNOSIS — O98313 Other infections with a predominantly sexual mode of transmission complicating pregnancy, third trimester: Secondary | ICD-10-CM | POA: Insufficient documentation

## 2018-02-18 ENCOUNTER — Ambulatory Visit (INDEPENDENT_AMBULATORY_CARE_PROVIDER_SITE_OTHER): Payer: Medicaid Other | Admitting: Advanced Practice Midwife

## 2018-02-18 ENCOUNTER — Encounter: Payer: Self-pay | Admitting: Advanced Practice Midwife

## 2018-02-18 ENCOUNTER — Other Ambulatory Visit: Payer: Medicaid Other

## 2018-02-18 ENCOUNTER — Other Ambulatory Visit: Payer: Self-pay | Admitting: Obstetrics and Gynecology

## 2018-02-18 VITALS — BP 108/64 | Wt 102.0 lb

## 2018-02-18 DIAGNOSIS — R7309 Other abnormal glucose: Secondary | ICD-10-CM

## 2018-02-18 DIAGNOSIS — O0990 Supervision of high risk pregnancy, unspecified, unspecified trimester: Secondary | ICD-10-CM

## 2018-02-18 DIAGNOSIS — Z3A30 30 weeks gestation of pregnancy: Secondary | ICD-10-CM | POA: Diagnosis not present

## 2018-02-18 DIAGNOSIS — O0993 Supervision of high risk pregnancy, unspecified, third trimester: Secondary | ICD-10-CM

## 2018-02-18 DIAGNOSIS — O9989 Other specified diseases and conditions complicating pregnancy, childbirth and the puerperium: Secondary | ICD-10-CM

## 2018-02-18 NOTE — Progress Notes (Signed)
Routine Prenatal Care Visit  Subjective  Dawn Meza is a 22 y.o. G1P0 at [redacted]w[redacted]d being seen today for ongoing prenatal care.  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  ----------------------------------------------------------------------------------- Patient reports no complaints.   Contractions: Not present. Vag. Bleeding: None.  Movement: Present. Denies leaking of fluid.  ----------------------------------------------------------------------------------- The following portions of the patient's history were reviewed and updated as appropriate: allergies, current medications, past family history, past medical history, past social history, past surgical history and problem list. Problem list updated.   Objective  Blood pressure 108/64, weight 102 lb (46.3 kg), last menstrual period 07/19/2017. Pregravid weight 88 lb (39.9 kg) Total Weight Gain 14 lb (6.35 kg) Urinalysis: Urine Protein    Urine Glucose    Fetal Status: Fetal Heart Rate (bpm): 159 Fundal Height: 29 cm Movement: Present     General:  Alert, oriented and cooperative. Patient is in no acute distress.  Skin: Skin is warm and dry. No rash noted.   Cardiovascular: Normal heart rate noted  Respiratory: Normal respiratory effort, no problems with respiration noted  Abdomen: Soft, gravid, appropriate for gestational age. Pain/Pressure: Absent     Pelvic:  Cervical exam deferred        Extremities: Normal range of motion.     Mental Status: Normal mood and affect. Normal  behavior. Normal judgment and thought content.   Assessment   22 y.o. G1P0 at [redacted]w[redacted]d by  04/25/2018, by Last Menstrual Period presenting for routine prenatal visit  Plan   pregnancy Problems (from 08/27/17 to present)    Problem Noted Resolved   Depression affecting pregnancy in second trimester, antepartum 01/19/2018 by Natale Milch, MD No   Uterine size date discrepancy pregnancy, second trimester 01/19/2018 by Natale Milch, MD No   Two vessel umbilical cord, antepartum, single gestation 12/21/2017 by Farrel Conners, CNM No   Overview Signed 12/21/2017 12:51 PM by Farrel Conners, CNM    Growth scans in third trimester every 4 weeks.      History of suicide attempt 08/27/2017 by Tresea Mall, CNM No   Supervision of high risk pregnancy, antepartum 08/27/2017 by Tresea Mall, CNM No   Overview Addendum 01/19/2018  2:50 PM by Natale Milch, MD    Clinic [facility] Prenatal Labs  Dating LMP = 8wk [place] Blood type: A/Positive/-- (05/28 1141)   Genetic Screen 1 Screen: Collected 5/28     AFP:          Antibody:Negative (05/28 1141)  Anatomic [place] 2 vessel cord, choroid plexus cysts Rubella: 4.02 (05/28 1141) Varicella: Immune  GTT Early:                Third trimester:  RPR: Non Reactive (05/28 1141)   Rhogam  not needed HBsAg: Negative (05/28 1141)   TDaP vaccine                        Flu Shot: 01/19/18 HIV: Non Reactive (05/28 1141)   Baby Food   Breast  GBS:   Contraception  undecided, giveninformation Pap: NEEDS PAP POSTPARTUM  CBB     CS/VBAC NA   Support Person Mom- Zella Ball           Adjustment disorder with mixed anxiety and depressed mood 06/17/2016 by Kerry Hough, PA-C No       Preterm labor symptoms and general obstetric precautions including but not limited to vaginal bleeding, contractions, leaking of fluid and fetal movement were reviewed in detail with the patient.    Return in about 2 weeks (around 03/04/2018) for  growth scan and rob.  Tresea Mall, CNM 02/18/2018 11:03 AM

## 2018-02-18 NOTE — Progress Notes (Signed)
No vb. No lof. 3hr gtt today.

## 2018-02-19 LAB — GESTATIONAL GLUCOSE TOLERANCE
GLUCOSE 1 HOUR GTT: 181 mg/dL — AB (ref 65–179)
GLUCOSE FASTING: 69 mg/dL (ref 65–94)
Glucose, GTT - 2 Hour: 126 mg/dL (ref 65–154)
Glucose, GTT - 3 Hour: 102 mg/dL (ref 65–139)

## 2018-02-21 NOTE — Progress Notes (Signed)
Negative, encouraged diet low in sugar and carbohydrates, discussed with patient on the phone.

## 2018-03-21 DIAGNOSIS — J4520 Mild intermittent asthma, uncomplicated: Secondary | ICD-10-CM | POA: Insufficient documentation

## 2018-07-18 ENCOUNTER — Encounter (HOSPITAL_COMMUNITY): Payer: Self-pay

## 2018-07-22 ENCOUNTER — Ambulatory Visit (INDEPENDENT_AMBULATORY_CARE_PROVIDER_SITE_OTHER): Payer: Medicaid Other | Admitting: Maternal Newborn

## 2018-07-22 ENCOUNTER — Encounter: Payer: Self-pay | Admitting: Maternal Newborn

## 2018-07-22 VITALS — BP 110/62 | HR 88 | Ht 60.0 in | Wt 97.0 lb

## 2018-07-22 DIAGNOSIS — B370 Candidal stomatitis: Secondary | ICD-10-CM

## 2018-07-22 DIAGNOSIS — G43901 Migraine, unspecified, not intractable, with status migrainosus: Secondary | ICD-10-CM

## 2018-07-22 MED ORDER — FLUCONAZOLE 150 MG PO TABS: 150.0000 mg | ORAL_TABLET | Freq: Once | ORAL | 0 refills | Status: AC

## 2018-07-22 NOTE — Progress Notes (Signed)
Postpartum Office Visit  S: States that baby is being treated for thrush, pain in both nipples but especially on the right side, with redness around the nipple. Also complains of an increase in migraine headaches, lasting up to four days, with no relief from OTC medication.  O: Blood pressure 110/62, pulse 88, height 5' (1.524 m), weight 97 lb (44 kg), currently breastfeeding.  General: No acute distress Respiratory: No increased work of breathing Breasts: lactating, no erythema or tenderness in breast tissue, bilateral nipples appear irritated with right side more reddened  Neuro: Alert and oriented Psych: Mood appropriate, affect full  A: 23 year old G1P1001 lactating with nipple irritation and pain likely due to thrush.  P: PO Diflucan per patient's preference for oral medication. Infant is being treated for thrush as well. Neurology consultation for worsening migraine headaches since delivery.  Marcelyn Bruins, CNM 07/22/2018

## 2018-07-25 ENCOUNTER — Encounter: Payer: Self-pay | Admitting: Maternal Newborn

## 2018-07-25 ENCOUNTER — Encounter: Payer: Self-pay | Admitting: Neurology

## 2018-09-10 DIAGNOSIS — B9689 Other specified bacterial agents as the cause of diseases classified elsewhere: Secondary | ICD-10-CM

## 2018-09-10 DIAGNOSIS — J019 Acute sinusitis, unspecified: Secondary | ICD-10-CM

## 2018-09-10 HISTORY — DX: Other specified bacterial agents as the cause of diseases classified elsewhere: B96.89

## 2018-09-10 HISTORY — DX: Other specified bacterial agents as the cause of diseases classified elsewhere: J01.90

## 2018-10-05 ENCOUNTER — Ambulatory Visit: Payer: Self-pay | Admitting: Neurology

## 2018-10-05 ENCOUNTER — Ambulatory Visit: Payer: Medicaid Other | Admitting: Neurology

## 2018-11-22 ENCOUNTER — Ambulatory Visit: Payer: Medicaid Other | Admitting: Neurology

## 2019-07-13 ENCOUNTER — Emergency Department: Payer: Medicaid Other

## 2019-07-13 ENCOUNTER — Other Ambulatory Visit: Payer: Self-pay

## 2019-07-13 ENCOUNTER — Encounter: Payer: Self-pay | Admitting: Emergency Medicine

## 2019-07-13 ENCOUNTER — Emergency Department
Admission: EM | Admit: 2019-07-13 | Discharge: 2019-07-13 | Disposition: A | Discharge status: Home / Self Care | Payer: Medicaid Other | Attending: Emergency Medicine | Admitting: Emergency Medicine

## 2019-07-13 DIAGNOSIS — Z87891 Personal history of nicotine dependence: Secondary | ICD-10-CM | POA: Diagnosis not present

## 2019-07-13 DIAGNOSIS — J45909 Unspecified asthma, uncomplicated: Secondary | ICD-10-CM | POA: Diagnosis not present

## 2019-07-13 DIAGNOSIS — R102 Pelvic and perineal pain: Secondary | ICD-10-CM | POA: Insufficient documentation

## 2019-07-13 DIAGNOSIS — Z79899 Other long term (current) drug therapy: Secondary | ICD-10-CM | POA: Insufficient documentation

## 2019-07-13 LAB — URINALYSIS, COMPLETE (UACMP) WITH MICROSCOPIC
Bilirubin Urine: NEGATIVE
Glucose, UA: NEGATIVE mg/dL
Hgb urine dipstick: NEGATIVE
Ketones, ur: NEGATIVE mg/dL
Nitrite: NEGATIVE
Protein, ur: NEGATIVE mg/dL
Specific Gravity, Urine: 1.019 (ref 1.005–1.030)
pH: 7 (ref 5.0–8.0)

## 2019-07-13 LAB — COMPREHENSIVE METABOLIC PANEL
ALT: 15 U/L (ref 0–44)
AST: 16 U/L (ref 15–41)
Albumin: 3.9 g/dL (ref 3.5–5.0)
Alkaline Phosphatase: 40 U/L (ref 38–126)
Anion gap: 9 (ref 5–15)
BUN: 13 mg/dL (ref 6–20)
CO2: 23 mmol/L (ref 22–32)
Calcium: 9 mg/dL (ref 8.9–10.3)
Chloride: 105 mmol/L (ref 98–111)
Creatinine, Ser: 0.79 mg/dL (ref 0.44–1.00)
GFR calc Af Amer: >60 mL/min (ref >60)
GFR calc non Af Amer: >60 mL/min (ref >60)
Glucose, Bld: 97 mg/dL (ref 70–99)
Potassium: 4.3 mmol/L (ref 3.5–5.1)
Sodium: 137 mmol/L (ref 135–145)
Total Bilirubin: 0.6 mg/dL (ref 0.3–1.2)
Total Protein: 6.9 g/dL (ref 6.5–8.1)

## 2019-07-13 LAB — POCT PREGNANCY, URINE: Preg Test, Ur: NEGATIVE

## 2019-07-13 LAB — CBC
HCT: 37.7 % (ref 36.0–46.0)
Hemoglobin: 12.1 g/dL (ref 12.0–15.0)
MCH: 27.8 pg (ref 26.0–34.0)
MCHC: 32.1 g/dL (ref 30.0–36.0)
MCV: 86.5 fL (ref 80.0–100.0)
Platelets: 255 10*3/uL (ref 150–400)
RBC: 4.36 MIL/uL (ref 3.87–5.11)
RDW: 12 % (ref 11.5–15.5)
WBC: 5.6 10*3/uL (ref 4.0–10.5)
nRBC: 0 % (ref 0.0–0.2)

## 2019-07-13 MED ORDER — ONDANSETRON 4 MG PO TBDP: 4.0000 mg | ORAL_TABLET | Freq: Three times a day (TID) | ORAL | 0 refills | Status: DC | PRN

## 2019-07-13 MED ORDER — KETOROLAC TROMETHAMINE 30 MG/ML IJ SOLN
30.0000 mg | Freq: Once | INTRAMUSCULAR | Status: AC
  Administered 2019-07-13: 15:00:00 30 mg via INTRAMUSCULAR
  Filled 2019-07-13: qty 1

## 2019-07-13 MED ORDER — KETOROLAC TROMETHAMINE 10 MG PO TABS: 10.0000 mg | ORAL_TABLET | Freq: Three times a day (TID) | ORAL | 0 refills | Status: DC

## 2019-07-13 MED ORDER — SODIUM CHLORIDE 0.9% FLUSH: 3.0000 mL | Freq: Once | INTRAVENOUS | Status: DC

## 2019-07-13 NOTE — ED Triage Notes (Signed)
Says low abd pain for couple days and got worse today.  Says recent uti that was treated.  Also changed birth control pills 2 months ago.  Denies vaginal discharge

## 2019-07-13 NOTE — ED Provider Notes (Signed)
[facility] Emergency Department Provider Note ____________________________________________  Time seen: 1310  I have reviewed the triage vital signs and the nursing notes.  HISTORY  Chief Complaint  Abdominal Pain  HPI Dawn Meza is a 24 y.o. female presents herself to the ED for  evaluation of intermittent lower abdominal pain for the last 2 days.  Patient describes symptoms have been coming in waves and was worse today.  She had a recent UTI treated at her PCPs office last month, with Macrobid.  She reports having nausea and vomiting with the Macrobid, but did complete the course.  She also reports that she started a new birth control pill about 2 months ago.  She describes pain to the lower pelvic region over right slightly greater than left.  She denies any current vaginal discharge, or abnormal vaginal bleeding.  She does admit to an LMP 2 weeks ago that was unusual and that the blood was dark in nature.  Past Medical History:  Diagnosis Date  . Anxiety   . Asthma   . Depression   . Suicide attempt [facility])     Patient Active Problem List   Diagnosis Date Noted  . 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 Depression affecting pregnancy in second trimester, antepartum 01/19/2018  . Uterine size date discrepancy pregnancy, second trimester 01/19/2018  . Two vessel umbilical cord, antepartum, single gestation 12/21/2017  . Seasonal allergies 10/12/2017  . History of suicide attempt 08/27/2017  . Supervision of high risk pregnancy, antepartum 08/27/2017  . Adjustment disorder with mixed anxiety and depressed mood 06/17/2016    Past Surgical History:  Procedure Laterality Date  . APPENDECTOMY      Prior to Admission medications   Medication Sig Start Date End Date Taking? Authorizing Provider  ketorolac (TORADOL) 10 MG tablet Take 1 tablet (10 mg total) by mouth every 8 (eight) hours. 07/13/19   Emilyrose Darrah, Charlesetta Ivory, PA-C   ondansetron (ZOFRAN ODT) 4 MG disintegrating tablet Take 1 tablet (4 mg total) by mouth every 8 (eight) hours as needed. 07/13/19   Arrion Broaddus, Charlesetta Ivory, PA-C  PRENATAL 28-0.8 MG TABS Take by mouth.    [provider]    Allergies Patient has no known allergies.  Family History  Problem Relation Age of Onset  . Hypertension Mother   . Heart disease Maternal Grandfather     Social History Social History   Tobacco Use  . Smoking status: Former Games developer  . Smokeless tobacco: Never Used  Substance Use Topics  . Alcohol use: No  . Drug use: Not Currently    Frequency: 7.0 times per week    Review of Systems  Constitutional: Negative for fever. Cardiovascular: Negative for chest pain. Respiratory: Negative for shortness of breath. Gastrointestinal: Negative for abdominal pain, vomiting and diarrhea. S/p appy. Genitourinary: Negative for dysuria. Reports pelvic pain as above Musculoskeletal: Negative for back pain. Skin: Negative for rash. Neurological: Negative for headaches, focal weakness or numbness. ____________________________________________  PHYSICAL EXAM:  VITAL SIGNS: ED Triage Vitals  Enc Vitals Group     BP 07/13/19 1045 115/64     Pulse Rate 07/13/19 1045 72     Resp 07/13/19 1045 18     Temp 07/13/19 1045 98.4 F (36.9 C)     Temp Source 07/13/19 1045 Oral     SpO2 07/13/19 1045 100 %     Weight 07/13/19 1041 90 lb (40.8 kg)     Height 07/13/19 1041 5' (  1.524 m)     Head Circumference --      Peak Flow --      Pain Score 07/13/19 1040 0     Pain Loc --      Pain Edu? --      Excl. in [place]? --     Constitutional: Alert and oriented. Well appearing and in no distress. Head: Normocephalic and atraumatic. Eyes: Conjunctivae are normal. Normal extraocular movements Cardiovascular: Normal rate, regular rhythm. Normal distal pulses. Respiratory: Normal respiratory effort. No wheezes/rales/rhonchi. Gastrointestinal: Soft, flat, and generally  nontender. Mildly tender to palp to the LLQ/pelvic region. No distention, rebound, guarding, or rigidity. No CVA tenderness elicited. GU: Deferred Musculoskeletal: Nontender with normal range of motion in all extremities.  Neurologic:  Normal gait without ataxia. Normal speech and language. No gross focal neurologic deficits are appreciated. Skin:  Skin is warm, dry and intact. No rash noted. Psychiatric: Mood and affect are normal. Patient exhibits appropriate insight and judgment. ____________________________________________   LABS (pertinent positives/negatives) Labs Reviewed  URINALYSIS, COMPLETE (UACMP) WITH MICROSCOPIC - Abnormal; Notable for the following components:      Result Value   Color, Urine YELLOW (*)    APPearance HAZY (*)    Leukocytes,Ua SMALL (*)    Bacteria, UA RARE (*)    All other components within normal limits  URINE CULTURE  COMPREHENSIVE METABOLIC PANEL  CBC  POC URINE PREG, ED  POCT PREGNANCY, URINE  ____________________________________________   RADIOLOGY  Transvaginal US IMPRESSION: 1. Mild to moderate fluid in the cul-de-sac. Suspect recent ovarian cyst rupture.  2. Low resistance waveforms in each ovary. Neither ovary is enlarged. No findings suggesting ovarian torsion on this study.  3. Currently no intrauterine or extrauterine pelvic or adnexal mass. No intra medial thickening. ____________________________________________  PROCEDURES  Toradol 30 mg IM Procedures ____________________________________________  INITIAL IMPRESSION / ASSESSMENT AND PLAN / ED COURSE  Differential diagnosis includes, but is not limited to, ovarian cyst, ovarian torsion, acute appendicitis, diverticulitis, urinary tract infection/pyelonephritis, endometriosis, bowel obstruction, colitis, renal colic, gastroenteritis, hernia, fibroids, endometriosis, pregnancy related pain including ectopic pregnancy, etc.  Female patient with ED evaluation of a few days  complaint of lower abdominal pain.  Patient describes intermittent symptoms that have ebbed and flowed.  She is reassured by her exam, lab, and ultrasound at this time.  Ultrasound confirms free fluid in the pelvic cul-de-sac which may represent recently ruptured ovarian cyst.  No signs of ovarian torsion at this time.  Patient reports improvement of her symptoms at this time, and denies any current pain.  She will be discharged with a prescription for ketorolac as well as Zofran.  She is advised to follow-up with her GYN provider or return to the ED as necessary.  Work note was provided for the next 2 days as requested.  Return precautions have been discussed and understood.  Tyshawn Keel was evaluated in Emergency Department on 07/13/2019 for the symptoms described in the history of present illness. She was evaluated in the context of the global COVID-19 pandemic, which necessitated consideration that the patient might be at risk for infection with the SARS-CoV-2 virus that causes COVID-19. Institutional protocols and algorithms that pertain to the evaluation of patients at risk for COVID-19 are in a state of rapid change based on information released by regulatory bodies including the CDC and federal and state organizations. These policies and algorithms were followed during the patient's care in the ED. ____________________________________________  FINAL CLINICAL IMPRESSION(S) / ED DIAGNOSES  Final diagnoses:  Pelvic pain      Karmen Stabs, Charlesetta Ivory, PA-C 07/13/19 1511    Minna Antis, MD 07/14/19 1440

## 2019-07-13 NOTE — Discharge Instructions (Signed)
Your exam, labs, and [place] are normal at this time. Take the prescription meds as needed. Follow-up with your GYN provider for further management. Return as needed.

## 2019-07-14 LAB — URINE CULTURE
Culture: <10000 — AB
Special Requests: NORMAL

## 2019-10-17 ENCOUNTER — Telehealth: Payer: Self-pay

## 2019-10-17 NOTE — Telephone Encounter (Signed)
Labial blister was cultured on 11/02/17 and found to be positive for HSV (type was not mentioned). 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 I can change it to type 1 on problem list but not sure it's a good idea to remove it altogether?

## 2019-10-17 NOTE — Telephone Encounter (Signed)
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 She would like this taken out of her chart.

## 2019-11-15 ENCOUNTER — Other Ambulatory Visit: Payer: Self-pay

## 2019-11-15 ENCOUNTER — Emergency Department
Admission: EM | Admit: 2019-11-15 | Discharge: 2019-11-15 | Disposition: A | Discharge status: Home / Self Care | Payer: Medicaid Other | Attending: Emergency Medicine | Admitting: Emergency Medicine

## 2019-11-15 ENCOUNTER — Encounter: Payer: Self-pay | Admitting: Emergency Medicine

## 2019-11-15 DIAGNOSIS — R5383 Other fatigue: Secondary | ICD-10-CM

## 2019-11-15 DIAGNOSIS — N390 Urinary tract infection, site not specified: Secondary | ICD-10-CM | POA: Insufficient documentation

## 2019-11-15 DIAGNOSIS — R634 Abnormal weight loss: Secondary | ICD-10-CM | POA: Insufficient documentation

## 2019-11-15 DIAGNOSIS — Z87891 Personal history of nicotine dependence: Secondary | ICD-10-CM | POA: Insufficient documentation

## 2019-11-15 DIAGNOSIS — J45909 Unspecified asthma, uncomplicated: Secondary | ICD-10-CM | POA: Diagnosis not present

## 2019-11-15 LAB — URINALYSIS, ROUTINE W REFLEX MICROSCOPIC
Bilirubin Urine: NEGATIVE
Glucose, UA: NEGATIVE mg/dL
Hgb urine dipstick: NEGATIVE
Ketones, ur: NEGATIVE mg/dL
Nitrite: NEGATIVE
Protein, ur: 30 mg/dL — AB
Specific Gravity, Urine: 1.023 (ref 1.005–1.030)
pH: 5 (ref 5.0–8.0)

## 2019-11-15 LAB — CHLAMYDIA/NGC RT PCR (ARMC ONLY)
Chlamydia Tr: NOT DETECTED
N gonorrhoeae: NOT DETECTED

## 2019-11-15 LAB — CBC WITH DIFFERENTIAL/PLATELET
Abs Immature Granulocytes: 0.01 10*3/uL (ref 0.00–0.07)
Basophils Absolute: 0 10*3/uL (ref 0.0–0.1)
Basophils Relative: 1 %
Eosinophils Absolute: 0 10*3/uL (ref 0.0–0.5)
Eosinophils Relative: 0 %
HCT: 42.3 % (ref 36.0–46.0)
Hemoglobin: 13.7 g/dL (ref 12.0–15.0)
Immature Granulocytes: 0 %
Lymphocytes Relative: 18 %
Lymphs Abs: 0.7 10*3/uL (ref 0.7–4.0)
MCH: 27.5 pg (ref 26.0–34.0)
MCHC: 32.4 g/dL (ref 30.0–36.0)
MCV: 84.9 fL (ref 80.0–100.0)
Monocytes Absolute: 0.2 10*3/uL (ref 0.1–1.0)
Monocytes Relative: 5 %
Neutro Abs: 2.9 10*3/uL (ref 1.7–7.7)
Neutrophils Relative %: 76 %
Platelets: 247 10*3/uL (ref 150–400)
RBC: 4.98 MIL/uL (ref 3.87–5.11)
RDW: 12.4 % (ref 11.5–15.5)
WBC: 3.9 10*3/uL — ABNORMAL LOW (ref 4.0–10.5)
nRBC: 0 % (ref 0.0–0.2)

## 2019-11-15 LAB — POCT PREGNANCY, URINE: Preg Test, Ur: NEGATIVE

## 2019-11-15 LAB — COMPREHENSIVE METABOLIC PANEL
ALT: 12 U/L (ref 0–44)
AST: 16 U/L (ref 15–41)
Albumin: 4.2 g/dL (ref 3.5–5.0)
Alkaline Phosphatase: 42 U/L (ref 38–126)
Anion gap: 10 (ref 5–15)
BUN: 12 mg/dL (ref 6–20)
CO2: 23 mmol/L (ref 22–32)
Calcium: 8.8 mg/dL — ABNORMAL LOW (ref 8.9–10.3)
Chloride: 101 mmol/L (ref 98–111)
Creatinine, Ser: 0.93 mg/dL (ref 0.44–1.00)
GFR calc Af Amer: >60 mL/min (ref >60)
GFR calc non Af Amer: >60 mL/min (ref >60)
Glucose, Bld: 94 mg/dL (ref 70–99)
Potassium: 3.9 mmol/L (ref 3.5–5.1)
Sodium: 134 mmol/L — ABNORMAL LOW (ref 135–145)
Total Bilirubin: 0.7 mg/dL (ref 0.3–1.2)
Total Protein: 7.4 g/dL (ref 6.5–8.1)

## 2019-11-15 LAB — URINE DRUG SCREEN, QUALITATIVE (ARMC ONLY)
Amphetamines, Ur Screen: NOT DETECTED
Barbiturates, Ur Screen: NOT DETECTED
Benzodiazepine, Ur Scrn: NOT DETECTED
Cannabinoid 50 Ng, Ur ~~LOC~~: POSITIVE — AB
Cocaine Metabolite,Ur ~~LOC~~: NOT DETECTED
MDMA (Ecstasy)Ur Screen: NOT DETECTED
Methadone Scn, Ur: NOT DETECTED
Opiate, Ur Screen: NOT DETECTED
Phencyclidine (PCP) Ur S: NOT DETECTED
Tricyclic, Ur Screen: NOT DETECTED

## 2019-11-15 LAB — WET PREP, GENITAL
Clue Cells Wet Prep HPF POC: NONE SEEN
Sperm: NONE SEEN
Trich, Wet Prep: NONE SEEN
Yeast Wet Prep HPF POC: NONE SEEN

## 2019-11-15 LAB — HCG, QUANTITATIVE, PREGNANCY: hCG, Beta Chain, Quant, S: <1 m[IU]/mL (ref <5)

## 2019-11-15 LAB — TSH: TSH: 1.133 u[IU]/mL (ref 0.350–4.500)

## 2019-11-15 MED ORDER — SODIUM CHLORIDE 0.9 % IV BOLUS
1000.0000 mL | Freq: Once | INTRAVENOUS | Status: AC
  Administered 2019-11-15: 1000 mL via INTRAVENOUS

## 2019-11-15 MED ORDER — SULFAMETHOXAZOLE-TRIMETHOPRIM 800-160 MG PO TABS
1.0000 | ORAL_TABLET | Freq: Two times a day (BID) | ORAL | 0 refills | Status: DC
Start: 2019-11-15 — End: 2020-01-01

## 2019-11-15 MED ORDER — FLUCONAZOLE 150 MG PO TABS
ORAL_TABLET | ORAL | 0 refills | Status: DC
Start: 2019-11-15 — End: 2020-01-01

## 2019-11-15 MED ORDER — ONDANSETRON 4 MG PO TBDP
4.0000 mg | ORAL_TABLET | Freq: Three times a day (TID) | ORAL | 0 refills | Status: DC | PRN
Start: 2019-11-15 — End: 2020-12-05

## 2019-11-15 NOTE — ED Notes (Signed)
POC Pregnancy test was negative.

## 2019-11-15 NOTE — ED Triage Notes (Signed)
Patient reports she has had decreased appetite and generalized aches and fatigue for the past couple of days. States her period is late but home tests have been negative.

## 2019-11-15 NOTE — Discharge Instructions (Signed)
Follow-up with your regular doctor to have your white blood cell counts rechecked in 1 week.  Follow-up with the gastroenterologist for the abdominal pain and weight loss.  Return emergency department if worsening

## 2019-11-15 NOTE — ED Provider Notes (Signed)
[facility] Emergency Department Provider Note  ____________________________________________   First MD Initiated Contact with Patient 11/15/19 1125     (approximate)  I have reviewed the triage vital signs and the nursing notes.   HISTORY  Chief Complaint Fatigue    HPI Dawn Meza is a 24 y.o. female presents emergency department complaining of fatigue for about 2 months.  Decreased appetite and no energy.  Some abdominal pain and cramping.  Unable to eat is having to eat smoothies.  States her regular doctor was going to refer her to GI but is on maternity leave.  Patient also has some dysuria.  Questionable STD as she recently broke up with her boyfriend.  Patient is mostly concerned about the abdominal pain and 13 pound weight loss in the last 2 months.    Past Medical History:  Diagnosis Date  . Anxiety   . Asthma   . Depression   . Suicide attempt [facility])     Patient Active Problem List   Diagnosis Date Noted  . 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 Depression affecting pregnancy in second trimester, antepartum 01/19/2018  . Uterine size date discrepancy pregnancy, second trimester 01/19/2018  . Two vessel umbilical cord, antepartum, single gestation 12/21/2017  . Seasonal allergies 10/12/2017  . History of suicide attempt 08/27/2017  . Supervision of high risk pregnancy, antepartum 08/27/2017  . Adjustment disorder with mixed anxiety and depressed mood 06/17/2016    Past Surgical History:  Procedure Laterality Date  . APPENDECTOMY      Prior to Admission medications   Medication Sig Start Date End Date Taking? Authorizing Provider  fluconazole (DIFLUCAN) 150 MG tablet Take one now and one in a week 11/15/19   Sherrie Mustache, Roselyn Bering, PA-C  ondansetron (ZOFRAN-ODT) 4 MG disintegrating tablet Take 1 tablet (4 mg total) by mouth every 8 (eight) hours as needed. 11/15/19   Jalene Demo, Roselyn Bering, PA-C    sulfamethoxazole-trimethoprim (BACTRIM DS) 800-160 MG tablet Take 1 tablet by mouth 2 (two) times daily. 11/15/19   Faythe Ghee, PA-C    Allergies Patient has no known allergies.  Family History  Problem Relation Age of Onset  . Hypertension Mother   . Heart disease Maternal Grandfather     Social History Social History   Tobacco Use  . Smoking status: Former Games developer  . Smokeless tobacco: Never Used  Vaping Use  . Vaping Use: Former  Substance Use Topics  . Alcohol use: No  . Drug use: Not Currently    Frequency: 7.0 times per week    Review of Systems  Constitutional: No fever/chills, + weight loss unintentional Eyes: No visual changes. ENT: No sore throat. Respiratory: Denies cough Cardiovascular: Denies chest pain Gastrointestinal: Denies abdominal pain with eating Genitourinary: Positive for dysuria. Musculoskeletal: Negative for back pain. Skin: Negative for rash. Psychiatric: no mood changes,     ____________________________________________   PHYSICAL EXAM:  VITAL SIGNS: ED Triage Vitals [11/15/19 1054]  Enc Vitals Group     BP (!) 121/95     Pulse Rate (!) 105     Resp 14     Temp 98.7 F (37.1 C)     Temp Source Oral     SpO2 100 %     Weight 83 lb (37.6 kg)     Height 5' (1.524 m)     Head Circumference      Peak Flow      Pain Score  Pain Loc      Pain Edu?      Excl. in GC?     Constitutional: Alert and oriented. Well appearing and in no acute distress. Pt is very thin Eyes: Conjunctivae are normal.  Head: Atraumatic. Nose: No congestion/rhinnorhea. Mouth/Throat: Mucous membranes are moist.   Neck:  supple no lymphadenopathy noted Cardiovascular: Normal rate, regular rhythm. Heart sounds are normal Respiratory: Normal respiratory effort.  No retractions, lungs c t a  Abd: soft nontender bs normal all 4 quad GU: normal external exam, some white discharge, no cervical motion tenderness Musculoskeletal: FROM all extremities,  warm and well perfused Neurologic:  Normal speech and language.  Skin:  Skin is warm, dry and intact. No rash noted. Psychiatric: Mood and affect are normal. Speech and behavior are normal.  ____________________________________________   LABS (all labs ordered are listed, but only abnormal results are displayed)  Labs Reviewed  WET PREP, GENITAL - Abnormal; Notable for the following components:      Result Value   WBC, Wet Prep HPF POC FEW (*)    All other components within normal limits  URINALYSIS, ROUTINE W REFLEX MICROSCOPIC - Abnormal; Notable for the following components:   Color, Urine YELLOW (*)    APPearance CLOUDY (*)    Protein, ur 30 (*)    Leukocytes,Ua SMALL (*)    Bacteria, UA RARE (*)    All other components within normal limits  COMPREHENSIVE METABOLIC PANEL - Abnormal; Notable for the following components:   Sodium 134 (*)    Calcium 8.8 (*)    All other components within normal limits  CBC WITH DIFFERENTIAL/PLATELET - Abnormal; Notable for the following components:   WBC 3.9 (*)    All other components within normal limits  URINE DRUG SCREEN, QUALITATIVE ([facility] ONLY) - Abnormal; Notable for the following components:   Cannabinoid 50 Ng, Ur [place] POSITIVE (*)    All other components within normal limits  CHLAMYDIA/NGC RT PCR ([facility] ONLY)  URINE CULTURE  TSH  HCG, QUANTITATIVE, PREGNANCY  POC URINE PREG, ED  POCT PREGNANCY, URINE   ____________________________________________   ____________________________________________  RADIOLOGY    ____________________________________________   PROCEDURES  Procedure(s) performed: No  Procedures    ____________________________________________   INITIAL IMPRESSION / ASSESSMENT AND PLAN / ED COURSE  Pertinent labs & imaging results that were available during my care of the patient were reviewed by me and considered in my medical decision making (see chart for details).   Patient is 24 year old  female presents emergency department with fatigue for about 2 months.  Decreased appetite and energy.  Abdominal pain with eating.  Weight loss of 13 pounds.  Some dysuria today.  See HPI  Physical exam shows patient to appear very thin.  Vitals normal.  Remainder of exam is unremarkable  DDx: STD, thyroid disease, anorexia, UTI  Labs are basically reassuring, comprehensive metabolic panel is normal, TSH is normal, urine drug screen only shows cannabinoids, wet prep is normal GC chlamydia negative, beta-hCG is negative, the only concern I have CBC has a decreased WBC of 3.19.  Patient has consistently trended downwards in her WBC levels.  She should have this rechecked with her regular doctor.  Urinalysis had a few white blood cells and rare bacteria so we will treat her for UTI.  She is to follow-up with GI concerning the weight loss and abdominal pain.  Follow-up with your regular doctor for recheck of her WBC in 1 to 2 weeks.  She states  she understands.  She was given a prescription for Septra, Diflucan, and Zofran.  She is discharged stable condition.   Timiyah Romito was evaluated in Emergency Department on 11/15/2019 for the symptoms described in the history of present illness. She was evaluated in the context of the global COVID-19 pandemic, which necessitated consideration that the patient might be at risk for infection with the SARS-CoV-2 virus that causes COVID-19. Institutional protocols and algorithms that pertain to the evaluation of patients at risk for COVID-19 are in a state of rapid change based on information released by regulatory bodies including the CDC and federal and state organizations. These policies and algorithms were followed during the patient's care in the ED.   As part of my medical decision making, I reviewed the following data within the electronic MEDICAL RECORD NUMBER Nursing notes reviewed and incorporated, Labs reviewed , Old chart reviewed, Notes from prior ED visits and [place]  Controlled Substance Database  ____________________________________________   FINAL CLINICAL IMPRESSION(S) / ED DIAGNOSES  Final diagnoses:  Fatigue, unspecified type  Unintentional weight loss  Urinary tract infection without hematuria, site unspecified      NEW MEDICATIONS STARTED DURING THIS VISIT:  Discharge Medication List as of 11/15/2019  2:14 PM    START taking these medications   Details  sulfamethoxazole-trimethoprim (BACTRIM DS) 800-160 MG tablet Take 1 tablet by mouth 2 (two) times daily., Starting Wed 11/15/2019, Normal         Note:  This document was prepared using Dragon voice recognition software and may include unintentional dictation errors.    Faythe Ghee, PA-C 11/15/19 1553    Minna Antis, MD 11/16/19 1234

## 2019-11-15 NOTE — ED Notes (Signed)
See triage note  Presents with fatigue for about 2 months   Decreased appetite and energy   Also has had body aches

## 2019-11-16 LAB — URINE CULTURE

## 2019-12-29 ENCOUNTER — Other Ambulatory Visit: Payer: Self-pay

## 2020-01-01 ENCOUNTER — Encounter: Payer: Self-pay | Admitting: Gastroenterology

## 2020-01-01 ENCOUNTER — Telehealth: Payer: Self-pay

## 2020-01-01 ENCOUNTER — Other Ambulatory Visit: Payer: Self-pay

## 2020-01-01 ENCOUNTER — Ambulatory Visit (INDEPENDENT_AMBULATORY_CARE_PROVIDER_SITE_OTHER): Payer: Medicaid Other | Admitting: Gastroenterology

## 2020-01-01 VITALS — BP 116/77 | HR 80 | Temp 97.8°F | Ht 60.0 in | Wt 81.0 lb

## 2020-01-01 DIAGNOSIS — R197 Diarrhea, unspecified: Secondary | ICD-10-CM | POA: Diagnosis not present

## 2020-01-01 DIAGNOSIS — R1030 Lower abdominal pain, unspecified: Secondary | ICD-10-CM | POA: Diagnosis not present

## 2020-01-01 NOTE — Progress Notes (Signed)
Dawn Meza [address]  [address]  [place], [place] [address]  Main: [phone]  Fax: [phone]   Gastroenterology Consultation  Referring Provider:     Drosinis, Leonia Reader, PA-C Primary Care Physician:  Drosinis, Leonia Reader, PA-C Reason for Consultation:     Abdominal pain        HPI:    Chief complaint: Abdominal pain  Dawn Meza is a 24 y.o. y/o female referred for consultation & management  by Dr. Wynonia Lawman, Leonia Reader, PA-C.  Patient reports chronic history of abdominal pain, left lower quadrant.  Dull, nonradiating,, not associated with nausea or vomiting.  Does report alternating constipation and diarrhea.  When she has diarrhea she bowel movement that day.  Otherwise may have days bowel movement intermittently.  States she eats well but continues to not gain weight.  Initially she states she thinks her pain started in 2016 after appendix removal, but on further questioning thinks that the symptoms were present prior to that.  Does report chronic anxiety and stressors, and also anxiety over her weight being low.   Denies having CT scans, states her last CT scan was in 2016 when her appendix was removed.  Has had pelvic and transvaginal ultrasounds, see epic and care everywhere.  Does report irregular periods.  Past Medical History:  Diagnosis Date  . Anxiety   . Asthma   . Depression   . Suicide attempt [facility])     Past Surgical History:  Procedure Laterality Date  . APPENDECTOMY      Prior to Admission medications   Medication Sig Start Date End Date Taking? Authorizing Provider  albuterol (VENTOLIN HFA) 108 (90 Base) MCG/ACT inhaler Inhale 1 puff into the lungs every 6 (six) hours as needed. 09/13/19  Yes [provider]  fluticasone (FLONASE) 50 MCG/ACT nasal spray Place 2 sprays into the nose 1 day or 1 dose. 09/10/18  Yes [provider]  medroxyPROGESTERone (DEPO-PROVERA) 150 MG/ML injection Inject 150 mLs into the muscle every 3  (three) months. 12/22/19  Yes [provider]  ondansetron (ZOFRAN-ODT) 4 MG disintegrating tablet Take 1 tablet (4 mg total) by mouth every 8 (eight) hours as needed. 11/15/19  Yes Fisher, Roselyn Bering, PA-C  sertraline (ZOLOFT) 25 MG tablet Take 1 tablet by mouth daily. 12/22/19 01/21/20 Yes [provider]    Family History  Problem Relation Age of Onset  . Hypertension Mother   . Heart disease Maternal Grandfather      Social History   Tobacco Use  . Smoking status: Former Smoker    Types: Cigarettes  . Smokeless tobacco: Never Used  Vaping Use  . Vaping Use: Every day  Substance Use Topics  . Alcohol use: No  . Drug use: Not Currently    Frequency: 7.0 times per week    Allergies as of 01/01/2020  . (No Known Allergies)    Review of Systems:    All systems reviewed and negative except where noted in HPI.   Physical Exam:  BP 116/77   Pulse 80   Temp 97.8 F (36.6 C) (Oral)   Ht 5' (1.524 m)   Wt 81 lb (36.7 kg)   BMI 15.82 kg/m  No LMP recorded. Psych:  Alert and cooperative. Normal mood and affect. General:   Alert,  Well-developed, well-nourished, pleasant and cooperative in NAD Head:  Normocephalic and atraumatic. Eyes:  Sclera clear, no icterus.   Conjunctiva pink. Ears:  Normal auditory acuity. Nose:  No deformity,  discharge, or lesions. Mouth:  No deformity or lesions,oropharynx pink & moist. Neck:  Supple; no masses or thyromegaly. Abdomen:  Normal bowel sounds.  No bruits.  Soft, non-tender and non-distended without masses, hepatosplenomegaly or hernias noted.  No guarding or rebound tenderness.    Msk:  Symmetrical without gross deformities. Good, equal movement & strength bilaterally. Pulses:  Normal pulses noted. Extremities:  No clubbing or edema.  No cyanosis. Neurologic:  Alert and oriented x3;  grossly normal neurologically. Skin:  Intact without significant lesions or rashes. No jaundice. Lymph Nodes:  No significant cervical  adenopathy. Psych:  Alert and cooperative. Normal mood and affect.   Labs: CBC    Component Value Date/Time   WBC 3.9 (L) 11/15/2019 1154   RBC 4.98 11/15/2019 1154   HGB 13.7 11/15/2019 1154   HGB 11.3 02/02/2018 0923   HCT 42.3 11/15/2019 1154   HCT 34.1 02/02/2018 0923   PLT 247 11/15/2019 1154   PLT 316 02/02/2018 0923   MCV 84.9 11/15/2019 1154   MCV 87 02/02/2018 0923   MCH 27.5 11/15/2019 1154   MCHC 32.4 11/15/2019 1154   RDW 12.4 11/15/2019 1154   RDW 13.4 02/02/2018 0923   LYMPHSABS 0.7 11/15/2019 1154   LYMPHSABS 1.3 02/02/2018 0923   MONOABS 0.2 11/15/2019 1154   EOSABS 0.0 11/15/2019 1154   EOSABS 0.1 02/02/2018 0923   BASOSABS 0.0 11/15/2019 1154   BASOSABS 0.0 02/02/2018 0923   CMP     Component Value Date/Time   NA 134 (L) 11/15/2019 1154   K 3.9 11/15/2019 1154   CL 101 11/15/2019 1154   CO2 23 11/15/2019 1154   GLUCOSE 94 11/15/2019 1154   BUN 12 11/15/2019 1154   CREATININE 0.93 11/15/2019 1154   CALCIUM 8.8 (L) 11/15/2019 1154   PROT 7.4 11/15/2019 1154   ALBUMIN 4.2 11/15/2019 1154   AST 16 11/15/2019 1154   ALT 12 11/15/2019 1154   ALKPHOS 42 11/15/2019 1154   BILITOT 0.7 11/15/2019 1154   GFRNONAA >60 11/15/2019 1154   GFRAA >60 11/15/2019 1154    Imaging Studies: No results found.  Assessment and Plan:   Dawn Meza is a 24 y.o. y/o female has been referred for abdominal pain, and inability to gain weight  Given reassuring work-up including most recent CBC from November 28, 2019 in Care Everywhere and chronic symptoms, her diagnosis is likely functional GI issues  I do not see a need for CT scan at this time  However, given her bowel movements intermittently, will obtain fecal calprotectin and if abnormal, can proceed with colonoscopy  She states she has lactose intolerance and has been avoiding lactose.  Also obtain celiac panel  I have encouraged her to work with her PCP to obtain a psychiatry referral, and work with her PCP on  anxiety and stressors leading to her GI symptoms.  She verbalized understanding.  I have also advised her to see GYN or get referral for GYN from her PCP given her irregular periods  However, before she is able to complete above work-up, she states she will have to talk to her insurance as we may be out of network.  After she talks to insurance, she will need to proceed with labs once or see another in network provider    Dr Dawn Meza  Speech recognition software was used to dictate the above note.

## 2020-01-01 NOTE — Telephone Encounter (Signed)
Dr. Maximino Greenland, do you know what the patient is talking about? Is she needing anything additionally other than what you had ordered for her at this time? Please advise. Thank you.

## 2020-01-01 NOTE — Telephone Encounter (Signed)
Patient states she got her medicaid which will be [facility] and it will go in effect on 01/17/2020. She wants to have a call back to see what she needs to scheduled now

## 2020-01-04 ENCOUNTER — Telehealth: Payer: Self-pay

## 2020-01-04 NOTE — Telephone Encounter (Signed)
Called patient to let her know that all she needed was the labs that were requested. Patient understood.

## 2020-02-08 DIAGNOSIS — F4323 Adjustment disorder with mixed anxiety and depressed mood: Secondary | ICD-10-CM | POA: Diagnosis not present

## 2020-03-25 DIAGNOSIS — Z3202 Encounter for pregnancy test, result negative: Secondary | ICD-10-CM | POA: Diagnosis not present

## 2020-03-25 DIAGNOSIS — N760 Acute vaginitis: Secondary | ICD-10-CM | POA: Diagnosis not present

## 2020-05-06 ENCOUNTER — Telehealth: Payer: Self-pay | Admitting: Gastroenterology

## 2020-05-06 NOTE — Telephone Encounter (Signed)
Pt has insurance now and wants to see what next step with tx is please

## 2020-05-06 NOTE — Telephone Encounter (Signed)
Called patient back and let her know that Dr. Maximino Greenland wanted her to do the stool tests that she had ordered in August first and if it's abnormal, then she will need for her to do a colonoscopy. Patient agreed to come in and pick up the supplies today or tomorrow.

## 2020-05-08 DIAGNOSIS — R197 Diarrhea, unspecified: Secondary | ICD-10-CM | POA: Diagnosis not present

## 2020-05-10 LAB — CELIAC DISEASE AB SCREEN W/RFX
Antigliadin Abs, IgA: 3 units (ref 0–19)
IgA/Immunoglobulin A, Serum: 151 mg/dL (ref 87–352)
Transglutaminase IgA: <2 U/mL (ref 0–3)

## 2020-05-10 LAB — CALPROTECTIN, FECAL: Calprotectin, Fecal: 18 ug/g (ref 0–120)

## 2020-05-14 ENCOUNTER — Telehealth: Payer: Self-pay

## 2020-05-14 NOTE — Telephone Encounter (Signed)
-----   Message from Pasty Spillers, MD sent at 05/13/2020  9:05 AM EST ----- Kandis Cocking please let the patient know, her labs were normal.  If she has continuing symptoms, please have her follow-up with me in clinic in January or February to rediscuss symptoms since I have not seen her since August

## 2020-05-14 NOTE — Telephone Encounter (Signed)
Patient verbalized understanding of results and still having symptoms . Made appointment 06/17/2020 at 9:45am

## 2020-05-21 ENCOUNTER — Encounter: Payer: Self-pay | Admitting: Gastroenterology

## 2020-05-21 ENCOUNTER — Encounter: Payer: Self-pay | Admitting: Podiatry

## 2020-05-21 ENCOUNTER — Telehealth: Payer: Self-pay | Admitting: Gastroenterology

## 2020-05-21 ENCOUNTER — Ambulatory Visit (INDEPENDENT_AMBULATORY_CARE_PROVIDER_SITE_OTHER): Payer: Medicaid Other | Admitting: Podiatry

## 2020-05-21 ENCOUNTER — Other Ambulatory Visit: Payer: Self-pay

## 2020-05-21 DIAGNOSIS — Z79899 Other long term (current) drug therapy: Secondary | ICD-10-CM

## 2020-05-21 DIAGNOSIS — B351 Tinea unguium: Secondary | ICD-10-CM

## 2020-05-21 NOTE — Progress Notes (Signed)
  Subjective:  Patient ID: Dawn Meza, female    DOB: 27-Aug-1995,  MRN: [id number]  Chief Complaint  Patient presents with  . Nail Problem    Patient presents today for dystrophic nail left 2nd toe x 2-3 years.  She says its really thick and discolored and bleeds and burns when she clips it down.  She is concerned that her right 2nd toenail is starting to get thick and discolored as well    25 y.o. female presents with the above complaint. Agree with the above complaint. Patient states this could have been due to microtrauma she had worn high heels in the past as well as [employer] shoes which may have limited the space in the toebox. There is a component of microtrauma associated with it. She has not tried any treatment option except topical medication. She denies any other acute complaints she would like to discuss treatment options   Review of Systems: Negative except as noted in the HPI. Denies N/V/F/Ch.  Past Medical History:  Diagnosis Date  . Anxiety   . Asthma   . Depression   . Suicide attempt [facility])     Current Outpatient Medications:  .  albuterol (VENTOLIN HFA) 108 (90 Base) MCG/ACT inhaler, Inhale 1 puff into the lungs every 6 (six) hours as needed., Disp: , Rfl:  .  ondansetron (ZOFRAN-ODT) 4 MG disintegrating tablet, Take 1 tablet (4 mg total) by mouth every 8 (eight) hours as needed., Disp: 20 tablet, Rfl: 0  Social History   Tobacco Use  Smoking Status Former Smoker  . Types: Cigarettes  Smokeless Tobacco Never Used    No Known Allergies Objective:  There were no vitals filed for this visit. There is no height or weight on file to calculate BMI. Constitutional Well developed. Well nourished.  Vascular Dorsalis pedis pulses palpable bilaterally. Posterior tibial pulses palpable bilaterally. Capillary refill normal to all digits.  No cyanosis or clubbing noted. Pedal hair growth normal.  Neurologic Normal speech. Oriented to person, place, and time. Epicritic  sensation to light touch grossly present bilaterally.  Dermatologic Nails Left second digit dystrophic discolored mycotic nail noted. Mild pain on palpation Skin within normal limits  Orthopedic: Normal joint ROM without pain or crepitus bilaterally. No visible deformities. No bony tenderness.   Radiographs: None Assessment:   1. Encounter for long-term current use of medication   2. Onychomycosis due to dermatophyte    Plan:  Patient was evaluated and treated and all questions answered.  Left second digit onychomycosis -Educated the patient on the etiology of onychomycosis and various treatment options associated with improving the fungal load.  I explained to the patient that there is 3 treatment options available to treat the onychomycosis including topical, p.o., laser treatment.  Patient elected to undergo p.o. options with Lamisil/terbinafine therapy.  In order for me to start the medication therapy, I explained to the patient the importance of evaluating the liver and obtaining the liver function test.  Once the liver function test comes back normal I will start him on 29-month course of Lamisil therapy.  Patient understood all risk and would like to proceed with Lamisil therapy.  I have asked the patient to immediately stop the Lamisil therapy if she has any reactions to it and call the office or go to the emergency room right away.  Patient states understanding   No follow-ups on file.

## 2020-05-21 NOTE — Telephone Encounter (Signed)
Pt calling about lab results  

## 2020-05-21 NOTE — Telephone Encounter (Signed)
Called patient to let her know about her lab results and what Dr. Maximino Greenland is recommending. Patient stated that she already had an appointment set up for 06/17/2020 with Dr. Maximino Greenland. I told her to keep that appointment to re discuss symptoms. Patient agreed and had no further questions.

## 2020-05-22 LAB — HEPATIC FUNCTION PANEL
ALT: 12 IU/L (ref 0–32)
AST: 15 IU/L (ref 0–40)
Albumin: 4.6 g/dL (ref 3.9–5.0)
Alkaline Phosphatase: 47 IU/L (ref 44–121)
Bilirubin Total: 0.4 mg/dL (ref 0.0–1.2)
Bilirubin, Direct: 0.13 mg/dL (ref 0.00–0.40)
Total Protein: 7.2 g/dL (ref 6.0–8.5)

## 2020-05-22 MED ORDER — TERBINAFINE HCL 250 MG PO TABS: 250.0000 mg | ORAL_TABLET | Freq: Every day | ORAL | 0 refills | Status: DC

## 2020-05-22 NOTE — Addendum Note (Signed)
Addended by: Nicholes Rough on: 05/22/2020 03:40 PM   Modules accepted: Orders

## 2020-05-28 ENCOUNTER — Ambulatory Visit: Payer: Medicaid Other | Admitting: Podiatry

## 2020-05-28 ENCOUNTER — Other Ambulatory Visit: Payer: Self-pay

## 2020-05-28 ENCOUNTER — Encounter: Payer: Self-pay | Admitting: Podiatry

## 2020-05-28 ENCOUNTER — Encounter: Payer: Self-pay | Admitting: *Deleted

## 2020-05-28 DIAGNOSIS — L603 Nail dystrophy: Secondary | ICD-10-CM | POA: Diagnosis not present

## 2020-05-28 DIAGNOSIS — S90212D Contusion of left great toe with damage to nail, subsequent encounter: Secondary | ICD-10-CM

## 2020-05-28 NOTE — Progress Notes (Signed)
  Subjective:  Patient ID: Dawn Meza, female    DOB: [birth year],  MRN: [id number]  Chief Complaint  Patient presents with  . Nail Problem    Here to have left 2nd toenail removed    25 y.o. female presents with the above complaint.  Patient presents with thickened elongated dystrophic left second toenail.  Patient states is painful to touch.  She would like to have removed.  She does not want to do Lamisil therapy.  She knows she will be very consistent with it.  She denies any other acute complaints.   Review of Systems: Negative except as noted in the HPI. Denies N/V/F/Ch.  Past Medical History:  Diagnosis Date  . Anxiety   . Asthma   . Depression   . Suicide attempt [facility])     Current Outpatient Medications:  .  albuterol (VENTOLIN HFA) 108 (90 Base) MCG/ACT inhaler, Inhale 1 puff into the lungs every 6 (six) hours as needed., Disp: , Rfl:  .  ondansetron (ZOFRAN-ODT) 4 MG disintegrating tablet, Take 1 tablet (4 mg total) by mouth every 8 (eight) hours as needed., Disp: 20 tablet, Rfl: 0 .  terbinafine (LAMISIL) 250 MG tablet, Take 1 tablet (250 mg total) by mouth daily., Disp: 90 tablet, Rfl: 0  Social History   Tobacco Use  Smoking Status Former Smoker  . Types: Cigarettes  Smokeless Tobacco Never Used    No Known Allergies Objective:  There were no vitals filed for this visit. There is no height or weight on file to calculate BMI. Constitutional Well developed. Well nourished.  Vascular Dorsalis pedis pulses palpable bilaterally. Posterior tibial pulses palpable bilaterally. Capillary refill normal to all digits.  No cyanosis or clubbing noted. Pedal hair growth normal.  Neurologic Normal speech. Oriented to person, place, and time. Epicritic sensation to light touch grossly present bilaterally.  Dermatologic Pain on palpation of the entire/total nail on 2nd digit of the left No other open wounds. No skin lesions.  Orthopedic: Normal joint ROM without pain or  crepitus bilaterally. No visible deformities. No bony tenderness.   Radiographs: None Assessment:   1. Nail dystrophy   2. Contusion of left great toe with damage to nail, subsequent encounter    Plan:  Patient was evaluated and treated and all questions answered.  Nail contusion/dystrophy second digit, left -Patient elects to proceed with minor surgery to remove entire toenail today. Consent reviewed and signed by patient. -Entire/total nail excised. See procedure note. -Educated on post-procedure care including soaking. Written instructions provided and reviewed. -Patient to follow up in 2 weeks for nail check.  Procedure: Excision of entire/total nail  Location: Left second toe digit Anesthesia: Lidocaine 1% plain; 1.5 mL and Marcaine 0.5% plain; 1.5 mL, digital block. Skin Prep: Betadine. Dressing: Silvadene; telfa; dry, sterile, compression dressing. Technique: Following skin prep, the toe was exsanguinated and a tourniquet was secured at the base of the toe. The affected nail border was freed and excised. The tourniquet was then removed and sterile dressing applied. Disposition: Patient tolerated procedure well. Patient to return in 2 weeks for follow-up.   No follow-ups on file.

## 2020-06-17 ENCOUNTER — Ambulatory Visit: Payer: Medicaid Other | Admitting: Gastroenterology

## 2020-06-24 DIAGNOSIS — Z20822 Contact with and (suspected) exposure to covid-19: Secondary | ICD-10-CM | POA: Diagnosis not present

## 2020-06-24 DIAGNOSIS — J069 Acute upper respiratory infection, unspecified: Secondary | ICD-10-CM | POA: Diagnosis not present

## 2020-06-25 ENCOUNTER — Ambulatory Visit: Payer: Medicaid Other | Admitting: Gastroenterology

## 2020-07-20 ENCOUNTER — Emergency Department
Admission: EM | Admit: 2020-07-20 | Discharge: 2020-07-20 | Disposition: A | Discharge status: Home / Self Care | Payer: Medicaid Other | Attending: Emergency Medicine | Admitting: Emergency Medicine

## 2020-07-20 ENCOUNTER — Encounter: Payer: Self-pay | Admitting: Emergency Medicine

## 2020-07-20 DIAGNOSIS — M545 Low back pain, unspecified: Secondary | ICD-10-CM | POA: Diagnosis not present

## 2020-07-20 DIAGNOSIS — N390 Urinary tract infection, site not specified: Secondary | ICD-10-CM

## 2020-07-20 DIAGNOSIS — J4520 Mild intermittent asthma, uncomplicated: Secondary | ICD-10-CM | POA: Diagnosis not present

## 2020-07-20 DIAGNOSIS — R109 Unspecified abdominal pain: Secondary | ICD-10-CM | POA: Diagnosis present

## 2020-07-20 DIAGNOSIS — Z87891 Personal history of nicotine dependence: Secondary | ICD-10-CM | POA: Diagnosis not present

## 2020-07-20 DIAGNOSIS — Z87440 Personal history of urinary (tract) infections: Secondary | ICD-10-CM | POA: Diagnosis not present

## 2020-07-20 LAB — BASIC METABOLIC PANEL
Anion gap: 9 (ref 5–15)
BUN: 12 mg/dL (ref 6–20)
CO2: 23 mmol/L (ref 22–32)
Calcium: 9.1 mg/dL (ref 8.9–10.3)
Chloride: 107 mmol/L (ref 98–111)
Creatinine, Ser: 0.73 mg/dL (ref 0.44–1.00)
GFR, Estimated: >60 mL/min (ref >60)
Glucose, Bld: 100 mg/dL — ABNORMAL HIGH (ref 70–99)
Potassium: 4.2 mmol/L (ref 3.5–5.1)
Sodium: 139 mmol/L (ref 135–145)

## 2020-07-20 LAB — URINALYSIS, COMPLETE (UACMP) WITH MICROSCOPIC
Bilirubin Urine: NEGATIVE
Glucose, UA: NEGATIVE mg/dL
Hgb urine dipstick: NEGATIVE
Ketones, ur: NEGATIVE mg/dL
Nitrite: POSITIVE — AB
Protein, ur: NEGATIVE mg/dL
Specific Gravity, Urine: 1.018 (ref 1.005–1.030)
WBC, UA: >50 WBC/hpf — ABNORMAL HIGH (ref 0–5)
pH: 7 (ref 5.0–8.0)

## 2020-07-20 LAB — CBC
HCT: 37.8 % (ref 36.0–46.0)
Hemoglobin: 12 g/dL (ref 12.0–15.0)
MCH: 27.5 pg (ref 26.0–34.0)
MCHC: 31.7 g/dL (ref 30.0–36.0)
MCV: 86.7 fL (ref 80.0–100.0)
Platelets: 272 10*3/uL (ref 150–400)
RBC: 4.36 MIL/uL (ref 3.87–5.11)
RDW: 12.3 % (ref 11.5–15.5)
WBC: 4.1 10*3/uL (ref 4.0–10.5)
nRBC: 0 % (ref 0.0–0.2)

## 2020-07-20 LAB — POC URINE PREG, ED: Preg Test, Ur: NEGATIVE

## 2020-07-20 MED ORDER — PHENAZOPYRIDINE HCL 100 MG PO TABS: 100.0000 mg | ORAL_TABLET | Freq: Three times a day (TID) | ORAL | 0 refills | Status: DC | PRN

## 2020-07-20 MED ORDER — SODIUM CHLORIDE 0.9 % IV BOLUS
1000.0000 mL | Freq: Once | INTRAVENOUS | Status: AC
  Administered 2020-07-20: 1000 mL via INTRAVENOUS

## 2020-07-20 MED ORDER — SODIUM CHLORIDE 0.9 % IV SOLN
1.0000 g | Freq: Once | INTRAVENOUS | Status: AC
  Administered 2020-07-20: 1 g via INTRAVENOUS
  Filled 2020-07-20: qty 10

## 2020-07-20 MED ORDER — ONDANSETRON HCL 4 MG/2ML IJ SOLN
4.0000 mg | Freq: Once | INTRAMUSCULAR | Status: AC
  Administered 2020-07-20: 4 mg via INTRAVENOUS
  Filled 2020-07-20: qty 2

## 2020-07-20 MED ORDER — NITROFURANTOIN MONOHYD MACRO 100 MG PO CAPS: 100.0000 mg | ORAL_CAPSULE | Freq: Two times a day (BID) | ORAL | 0 refills | Status: AC

## 2020-07-20 NOTE — ED Triage Notes (Signed)
Pt in with UTI symptoms, back pain, hx of frequent UTI's. Temp 98.9 in triage. Feels chills at times

## 2020-07-20 NOTE — Discharge Instructions (Signed)
Begin taking antibiotic until completely finished.  Return to the emergency department if any fever, chills, nausea or inability to take the antibiotic.  Increase fluids.  Call make an appointment with your primary care provider to recheck your urine after you have finished the antibiotic to make sure that all infection is completely cleared.

## 2020-07-20 NOTE — ED Provider Notes (Signed)
[facility] Emergency Department Provider Note  ____________________________________________   Event Date/Time   First MD Initiated Contact with Patient 07/20/20 [phone]     (approximate)  I have reviewed the triage vital signs and the nursing notes.   HISTORY  Chief Complaint Abdominal Pain, Urinary Frequency, and Back Pain   HPI Dawn Meza is a 25 y.o. female presents to the ED with complaint of urinary tract symptoms and history of UTIs frequently.  Patient states she began having symptoms approximately 1-1/2 weeks ago.  She denies any nausea, vomiting or fever.  Patient does report that she has subjective chills intermittently and some back pain infrequently.  Patient reports dysuria and frequency.     Past Medical History:  Diagnosis Date  . Anxiety   . Asthma   . Depression   . Suicide attempt [facility])     Patient Active Problem List   Diagnosis Date Noted  . Acute bacterial sinusitis 09/10/2018  . Mild intermittent asthma without complication 03/21/2018  . 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 Herpes simplex vulvovaginitis 02/14/2018  . Depression affecting pregnancy in second trimester, antepartum 01/19/2018  . Uterine size date discrepancy pregnancy, second trimester 01/19/2018  . Two vessel umbilical cord, antepartum, single gestation 12/21/2017  . Seasonal allergies 10/12/2017  . History of suicide attempt 08/27/2017  . Supervision of high risk pregnancy, antepartum 08/27/2017  . Adjustment disorder with mixed anxiety and depressed mood 06/17/2016    Past Surgical History:  Procedure Laterality Date  . APPENDECTOMY      Prior to Admission medications   Medication Sig Start Date End Date Taking? Authorizing Provider  nitrofurantoin, macrocrystal-monohydrate, (MACROBID) 100 MG capsule Take 1 capsule (100 mg total) by mouth 2 (two) times daily for 7 days. 07/20/20 07/27/20 Yes Tommi Rumps, PA-C   phenazopyridine (PYRIDIUM) 100 MG tablet Take 1 tablet (100 mg total) by mouth 3 (three) times daily as needed for pain. 07/20/20  Yes Bridget Hartshorn L, PA-C  albuterol (VENTOLIN HFA) 108 (90 Base) MCG/ACT inhaler Inhale 1 puff into the lungs every 6 (six) hours as needed. 09/13/19   [provider]  ondansetron (ZOFRAN-ODT) 4 MG disintegrating tablet Take 1 tablet (4 mg total) by mouth every 8 (eight) hours as needed. 11/15/19   Fisher, Roselyn Bering, PA-C  terbinafine (LAMISIL) 250 MG tablet Take 1 tablet (250 mg total) by mouth daily. 05/22/20   Candelaria Stagers, DPM    Allergies Patient has no known allergies.  Family History  Problem Relation Age of Onset  . Hypertension Mother   . Heart disease Maternal Grandfather     Social History Social History   Tobacco Use  . Smoking status: Former Smoker    Types: Cigarettes  . Smokeless tobacco: Never Used  Vaping Use  . Vaping Use: Every day  Substance Use Topics  . Alcohol use: No  . Drug use: Not Currently    Frequency: 7.0 times per week    Review of Systems Constitutional: No fever/subjective chills Eyes: No visual changes. ENT: No sore throat. Cardiovascular: Denies chest pain. Respiratory: Denies shortness of breath. Gastrointestinal: No abdominal pain.  No nausea, no vomiting.  No diarrhea. Genitourinary: Positive for dysuria. Musculoskeletal: Positive intermittent back pain. Skin: Negative for rash. Neurological: Negative for headaches, focal weakness or numbness. ____________________________________________   PHYSICAL EXAM:  VITAL SIGNS: ED Triage Vitals [07/20/20 0743]  Enc Vitals Group     BP 121/76     Pulse Rate  79     Resp 18     Temp 98.9 F (37.2 C)     Temp Source Oral     SpO2 100 %     Weight      Height      Head Circumference      Peak Flow      Pain Score      Pain Loc      Pain Edu?      Excl. in GC?     Constitutional: Alert and oriented. Well appearing and in no acute  distress. Eyes: Conjunctivae are normal. PERRL. EOMI. Head: Atraumatic. Nose: No congestion/rhinnorhea. Neck: No stridor.   Cardiovascular: Normal rate, regular rhythm. Grossly normal heart sounds.  Good peripheral circulation. Respiratory: Normal respiratory effort.  No retractions. Lungs CTAB. Gastrointestinal: Soft and nontender. No distention.  Mild left CVA tenderness. Musculoskeletal: Moves upper and lower extremities any difficulty and normal gait was noted. Neurologic:  Normal speech and language. No gross focal neurologic deficits are appreciated. No gait instability. Skin:  Skin is warm, dry and intact. No rash noted. Psychiatric: Mood and affect are normal. Speech and behavior are normal.  ____________________________________________   LABS (all labs ordered are listed, but only abnormal results are displayed)  Labs Reviewed  URINALYSIS, COMPLETE (UACMP) WITH MICROSCOPIC - Abnormal; Notable for the following components:      Result Value   Color, Urine YELLOW (*)    APPearance CLOUDY (*)    Nitrite POSITIVE (*)    Leukocytes,Ua MODERATE (*)    WBC, UA >50 (*)    Bacteria, UA MANY (*)    All other components within normal limits  BASIC METABOLIC PANEL - Abnormal; Notable for the following components:   Glucose, Bld 100 (*)    All other components within normal limits  URINE CULTURE  CBC  POC URINE PREG, ED    PROCEDURES  Procedure(s) performed (including Critical Care):  Procedures   ____________________________________________   INITIAL IMPRESSION / ASSESSMENT AND PLAN / ED COURSE  As part of my medical decision making, I reviewed the following data within the electronic MEDICAL RECORD NUMBER Notes from prior ED visits and [place] Controlled Substance Database  25 year old female presents to the ED with complaint of urinary symptoms and occasional chills but no fever, nausea or vomiting.  Patient states that his she has a history of urinary tract infections.  Today  she has some very mild left CVA tenderness but is afebrile.  Urinalysis is consistent with a urinary tract infection.  Patient was given Rocephin 1 g IV and a prescription for Macrobid twice daily for 7 days and a prescription for Pyridium.  A culture was ordered and patient was strongly encouraged to follow-up with her primary care provider for recheck of her urine after finishing the antibiotic.  ____________________________________________   FINAL CLINICAL IMPRESSION(S) / ED DIAGNOSES  Final diagnoses:  Acute urinary tract infection  History of recurrent urinary tract infection     ED Discharge Orders         Ordered    nitrofurantoin, macrocrystal-monohydrate, (MACROBID) 100 MG capsule  2 times daily        07/20/20 0922    phenazopyridine (PYRIDIUM) 100 MG tablet  3 times daily PRN        07/20/20 6295          *Please note:  Dawn Meza was evaluated in Emergency Department on 07/20/2020 for the symptoms described in the history of present illness. She  was evaluated in the context of the global COVID-19 pandemic, which necessitated consideration that the patient might be at risk for infection with the SARS-CoV-2 virus that causes COVID-19. Institutional protocols and algorithms that pertain to the evaluation of patients at risk for COVID-19 are in a state of rapid change based on information released by regulatory bodies including the CDC and federal and state organizations. These policies and algorithms were followed during the patient's care in the ED.  Some ED evaluations and interventions may be delayed as a result of limited staffing during and the pandemic.*   Note:  This document was prepared using Dragon voice recognition software and may include unintentional dictation errors.    Tommi Rumps, PA-C 07/20/20 1347    Dionne Bucy, MD 07/20/20 [phone]

## 2020-07-22 LAB — URINE CULTURE
Culture: >=100000 — AB
Special Requests: NORMAL

## 2020-07-23 ENCOUNTER — Ambulatory Visit: Payer: Medicaid Other | Admitting: Gastroenterology

## 2020-07-23 ENCOUNTER — Other Ambulatory Visit: Payer: Self-pay

## 2020-07-23 ENCOUNTER — Encounter: Payer: Self-pay | Admitting: Gastroenterology

## 2020-07-23 VITALS — BP 127/89 | HR 105 | Temp 99.0°F | Ht 60.0 in | Wt 80.0 lb

## 2020-07-23 DIAGNOSIS — R1310 Dysphagia, unspecified: Secondary | ICD-10-CM | POA: Diagnosis not present

## 2020-07-23 DIAGNOSIS — R634 Abnormal weight loss: Secondary | ICD-10-CM | POA: Diagnosis not present

## 2020-07-23 DIAGNOSIS — R1032 Left lower quadrant pain: Secondary | ICD-10-CM

## 2020-07-23 DIAGNOSIS — R1319 Other dysphagia: Secondary | ICD-10-CM

## 2020-07-23 NOTE — Progress Notes (Unsigned)
Dawn Bouillon, MD [address]  [address]  [place], [place] [address]  Main: [phone]  Fax: [phone]   Primary Care Physician: Drosinis, Leonia Reader, PA-C   Chief Complaint  Patient presents with  . Abdominal Pain    HPI: Dawn Meza is a 25 y.o. female here for follow-up.  Patient reports left lower quadrant abdominal pain, sharp, nonradiating, 8/10, lasting a few minutes, intermittent.  Reports some nausea but no vomiting.  Denies any fever or chills.  States she has been trying to gain weight without good results.  Eats 3 meals a day, does not avoid carbs.  Eats proteins including red meat.  Labs on previous visit including fecal calprotectin, celiac panel previously reassuring She avoids lactose as it causes abdominal bloating and diarrhea.  Patient is reporting intermittent dysphagia to solids, especially breads for over a year.  No prior history of food impaction.  Drinks water and eventually the bolus passes.  Does report heartburn as well 3-4 times a week.  Current Outpatient Medications  Medication Sig Dispense Refill  . albuterol (VENTOLIN HFA) 108 (90 Base) MCG/ACT inhaler Inhale 1 puff into the lungs every 6 (six) hours as needed.    . nitrofurantoin, macrocrystal-monohydrate, (MACROBID) 100 MG capsule Take 1 capsule (100 mg total) by mouth 2 (two) times daily for 7 days. 14 capsule 0  . ondansetron (ZOFRAN-ODT) 4 MG disintegrating tablet Take 1 tablet (4 mg total) by mouth every 8 (eight) hours as needed. 20 tablet 0  . phenazopyridine (PYRIDIUM) 100 MG tablet Take 1 tablet (100 mg total) by mouth 3 (three) times daily as needed for pain. 10 tablet 0  . terbinafine (LAMISIL) 250 MG tablet Take 1 tablet (250 mg total) by mouth daily. (Patient not taking: Reported on 07/23/2020) 90 tablet 0   No current facility-administered medications for this visit.    Allergies as of 07/23/2020  . (No Known Allergies)    ROS:  General: Negative for anorexia,  weight loss, fever, chills, fatigue, weakness. ENT: Negative for hoarseness, difficulty swallowing , nasal congestion. CV: Negative for chest pain, angina, palpitations, dyspnea on exertion, peripheral edema.  Respiratory: Negative for dyspnea at rest, dyspnea on exertion, cough, sputum, wheezing.  GI: See history of present illness. GU:  Negative for dysuria, hematuria, urinary incontinence, urinary frequency, nocturnal urination.  Endo: Negative for unusual weight change.    Physical Examination:   BP 127/89   Pulse (!) 105   Temp 99 F (37.2 C) (Oral)   Ht 5' (1.524 m)   Wt 80 lb (36.3 kg)   BMI 15.62 kg/m   General: Well-nourished, well-developed in no acute distress.  Eyes: No icterus. Conjunctivae pink. Mouth: Oropharyngeal mucosa moist and pink , no lesions erythema or exudate. Neck: Supple, Trachea midline Abdomen: Bowel sounds are normal, nontender, nondistended, no hepatosplenomegaly or masses, no abdominal bruits or hernia , no rebound or guarding.   Extremities: No lower extremity edema. No clubbing or deformities. Neuro: Alert and oriented x 3.  Grossly intact. Skin: Warm and dry, no jaundice.   Psych: Alert and cooperative, normal mood and affect.   Labs: CMP     Component Value Date/Time   NA 139 07/20/2020 0745   K 4.2 07/20/2020 0745   CL 107 07/20/2020 0745   CO2 23 07/20/2020 0745   GLUCOSE 100 (H) 07/20/2020 0745   BUN 12 07/20/2020 0745   CREATININE 0.73 07/20/2020 0745   CALCIUM 9.1 07/20/2020 0745   PROT 7.2 05/21/2020  1346   ALBUMIN 4.6 05/21/2020 1346   AST 15 05/21/2020 1346   ALT 12 05/21/2020 1346   ALKPHOS 47 05/21/2020 1346   BILITOT 0.4 05/21/2020 1346   GFRNONAA >60 07/20/2020 0745   GFRAA >60 11/15/2019 1154   Lab Results  Component Value Date   WBC 4.1 07/20/2020   HGB 12.0 07/20/2020   HCT 37.8 07/20/2020   MCV 86.7 07/20/2020   PLT 272 07/20/2020    Imaging Studies: No results found.  Assessment and Plan:   Dawn Meza is a 25 y.o. y/o female here for abdominal pain, dysphagia  Patient has had inability to gain weight and is reporting left lower quadrant abdominal pain.  Will obtain CT scan for further evaluation  EGD indicated for dysphagia to rule out EOE  I have discussed alternative options, risks & benefits,  which include, but are not limited to, bleeding, infection, perforation,respiratory complication & drug reaction.  The patient agrees with this plan & written consent will be obtained.    Alternative options of conservative management were discussed in detail, including but not limited to medication management, foregoing endoscopic procedures at this time and others.    Would recommend nutritionist referral as well given patient's inability to gain weight despite eating well and low BMI    Dr Dawn Meza

## 2020-07-24 ENCOUNTER — Other Ambulatory Visit
Admission: RE | Admit: 2020-07-24 | Discharge: 2020-07-24 | Disposition: A | Discharge status: Home / Self Care | Payer: Medicaid Other | Source: Ambulatory Visit | Attending: Gastroenterology | Admitting: Gastroenterology

## 2020-07-24 DIAGNOSIS — Z01812 Encounter for preprocedural laboratory examination: Secondary | ICD-10-CM | POA: Diagnosis not present

## 2020-07-24 DIAGNOSIS — Z20822 Contact with and (suspected) exposure to covid-19: Secondary | ICD-10-CM | POA: Insufficient documentation

## 2020-07-24 LAB — SARS CORONAVIRUS 2 (TAT 6-24 HRS): SARS Coronavirus 2: NEGATIVE

## 2020-07-25 ENCOUNTER — Encounter: Payer: Self-pay | Admitting: Gastroenterology

## 2020-07-26 ENCOUNTER — Encounter: Payer: Self-pay | Admitting: Gastroenterology

## 2020-07-26 ENCOUNTER — Ambulatory Visit
Admission: RE | Admit: 2020-07-26 | Discharge: 2020-07-26 | Disposition: A | Discharge status: Home / Self Care | Payer: Medicaid Other | Attending: Gastroenterology | Admitting: Gastroenterology

## 2020-07-26 ENCOUNTER — Other Ambulatory Visit: Payer: Self-pay

## 2020-07-26 ENCOUNTER — Encounter
Admission: RE | Disposition: A | Discharge status: Home / Self Care | Payer: Self-pay | Source: Home / Self Care | Attending: Gastroenterology

## 2020-07-26 ENCOUNTER — Ambulatory Visit: Payer: Medicaid Other | Admitting: Certified Registered Nurse Anesthetist

## 2020-07-26 DIAGNOSIS — R1319 Other dysphagia: Secondary | ICD-10-CM

## 2020-07-26 DIAGNOSIS — F17290 Nicotine dependence, other tobacco product, uncomplicated: Secondary | ICD-10-CM | POA: Diagnosis not present

## 2020-07-26 DIAGNOSIS — R634 Abnormal weight loss: Secondary | ICD-10-CM | POA: Diagnosis not present

## 2020-07-26 DIAGNOSIS — R1310 Dysphagia, unspecified: Secondary | ICD-10-CM | POA: Diagnosis not present

## 2020-07-26 DIAGNOSIS — K222 Esophageal obstruction: Secondary | ICD-10-CM | POA: Diagnosis not present

## 2020-07-26 DIAGNOSIS — R109 Unspecified abdominal pain: Secondary | ICD-10-CM

## 2020-07-26 HISTORY — PX: ESOPHAGOGASTRODUODENOSCOPY (EGD) WITH PROPOFOL: SHX5813

## 2020-07-26 LAB — POCT PREGNANCY, URINE: Preg Test, Ur: NEGATIVE

## 2020-07-26 SURGERY — ESOPHAGOGASTRODUODENOSCOPY (EGD) WITH PROPOFOL
Anesthesia: General

## 2020-07-26 MED ORDER — LIDOCAINE HCL (CARDIAC) PF 100 MG/5ML IV SOSY
PREFILLED_SYRINGE | INTRAVENOUS | Status: DC | PRN
  Administered 2020-07-26: 40 mg via INTRAVENOUS

## 2020-07-26 MED ORDER — PROPOFOL 10 MG/ML IV BOLUS
INTRAVENOUS | Status: DC | PRN
Start: 2020-07-26 — End: 2020-07-26
  Administered 2020-07-26: 30 mg via INTRAVENOUS
  Administered 2020-07-26: 40 mg via INTRAVENOUS
  Administered 2020-07-26: 60 mg via INTRAVENOUS

## 2020-07-26 MED ORDER — ALBUTEROL SULFATE (2.5 MG/3ML) 0.083% IN NEBU
2.5000 mg | INHALATION_SOLUTION | Freq: Once | RESPIRATORY_TRACT | Status: DC
  Filled 2020-07-26: qty 3

## 2020-07-26 MED ORDER — PROPOFOL 500 MG/50ML IV EMUL
INTRAVENOUS | Status: DC | PRN
  Administered 2020-07-26: 120 ug/kg/min via INTRAVENOUS

## 2020-07-26 MED ORDER — MIDAZOLAM HCL 2 MG/2ML IJ SOLN
INTRAMUSCULAR | Status: AC
  Filled 2020-07-26: qty 2

## 2020-07-26 MED ORDER — IPRATROPIUM-ALBUTEROL 0.5-2.5 (3) MG/3ML IN SOLN
RESPIRATORY_TRACT | Status: AC
  Administered 2020-07-26: 3 mL
  Filled 2020-07-26: qty 3

## 2020-07-26 MED ORDER — MIDAZOLAM HCL 2 MG/2ML IJ SOLN
INTRAMUSCULAR | Status: DC | PRN
  Administered 2020-07-26 (×2): 1 mg via INTRAVENOUS

## 2020-07-26 MED ORDER — SODIUM CHLORIDE 0.9 % IV SOLN: INTRAVENOUS | Status: DC

## 2020-07-26 MED ORDER — OMEPRAZOLE 20 MG PO CPDR: 20.0000 mg | DELAYED_RELEASE_CAPSULE | Freq: Every day | ORAL | 0 refills | Status: DC

## 2020-07-26 NOTE — H&P (Signed)
Dawn Bouillon, MD [address], [address], [place], [place], [address] [address], [address], [place], [place], [address] Phone: [phone]  Fax: [phone]  Primary Care Physician:  Drosinis, Leonia Reader, PA-C   Pre-Procedure History & Physical: HPI:  Dawn Meza is a 25 y.o. female is here for an EGD.   Past Medical History:  Diagnosis Date  . Anxiety   . Asthma   . Depression   . Suicide attempt [facility])     Past Surgical History:  Procedure Laterality Date  . APPENDECTOMY      Prior to Admission medications   Medication Sig Start Date End Date Taking? Authorizing Provider  albuterol (VENTOLIN HFA) 108 (90 Base) MCG/ACT inhaler Inhale 1 puff into the lungs every 6 (six) hours as needed. 09/13/19  Yes [provider]  nitrofurantoin, macrocrystal-monohydrate, (MACROBID) 100 MG capsule Take 1 capsule (100 mg total) by mouth 2 (two) times daily for 7 days. 07/20/20 07/27/20 Yes Summers, Rhonda L, PA-C  ondansetron (ZOFRAN-ODT) 4 MG disintegrating tablet Take 1 tablet (4 mg total) by mouth every 8 (eight) hours as needed. 11/15/19  Yes Fisher, Roselyn Bering, PA-C  phenazopyridine (PYRIDIUM) 100 MG tablet Take 1 tablet (100 mg total) by mouth 3 (three) times daily as needed for pain. 07/20/20  Yes Bridget Hartshorn L, PA-C  terbinafine (LAMISIL) 250 MG tablet Take 1 tablet (250 mg total) by mouth daily. Patient not taking: Reported on 07/23/2020 05/22/20   Candelaria Stagers, DPM    Allergies as of 07/23/2020  . (No Known Allergies)    Family History  Problem Relation Age of Onset  . Hypertension Mother   . Heart disease Maternal Grandfather     Social History   Socioeconomic History  . Marital status: Single    Spouse name: Not on file  . Number of children: Not on file  . Years of education: Not on file  . Highest education level: Not on file  Occupational History  . Not on file  Tobacco Use  . Smoking status: Former Smoker    Types: Cigarettes  . Smokeless tobacco:  Never Used  Vaping Use  . Vaping Use: Every day  Substance and Sexual Activity  . Alcohol use: No  . Drug use: Not Currently    Frequency: 7.0 times per week  . Sexual activity: Not Currently    Partners: Male    Birth control/protection: None  Other Topics Concern  . Not on file  Social History Narrative  . Not on file   Social Determinants of Health   Financial Resource Strain: Not on file  Food Insecurity: Not on file  Transportation Needs: Not on file  Physical Activity: Not on file  Stress: Not on file  Social Connections: Not on file  Intimate Partner Violence: Not on file    Review of Systems: See HPI, otherwise negative ROS  Physical Exam: LMP 07/10/2020 (Exact Date)  General:   Alert,  pleasant and cooperative in NAD Head:  Normocephalic and atraumatic. Neck:  Supple; no masses or thyromegaly. Lungs:  Clear throughout to auscultation, normal respiratory effort.    Heart:  +S1, +S2, Regular rate and rhythm, No edema. Abdomen:  Soft, nontender and nondistended. Normal bowel sounds, without guarding, and without rebound.   Neurologic:  Alert and  oriented x4;  grossly normal neurologically.  Impression/Plan: Dawn Meza is here for an EGD for dysphagia  Risks, benefits, limitations, and alternatives regarding the procedure have been reviewed with the patient.  Questions  have been answered.  All parties agreeable.   Pasty Spillers, MD  07/26/2020, 10:54 AM

## 2020-07-26 NOTE — Anesthesia Preprocedure Evaluation (Signed)
Anesthesia Evaluation  Patient identified by MRN, date of birth, ID band Patient awake    Reviewed: Allergy & Precautions, NPO status , Patient's Chart, lab work & pertinent test results  History of Anesthesia Complications Negative for: history of anesthetic complications  Airway Mallampati: II       Dental   Pulmonary asthma , neg sleep apnea, neg COPD, Current Smoker,           Cardiovascular (-) hypertension(-) Past MI and (-) CHF (-) dysrhythmias (-) Valvular Problems/Murmurs     Neuro/Psych neg Seizures Anxiety Depression    GI/Hepatic Neg liver ROS, neg GERD  ,  Endo/Other  neg diabetes  Renal/GU negative Renal ROS     Musculoskeletal   Abdominal   Peds  Hematology   Anesthesia Other Findings   Reproductive/Obstetrics                            Anesthesia Physical Anesthesia Plan  ASA: II  Anesthesia Plan: General   Post-op Pain Management:    Induction: Intravenous  PONV Risk Score and Plan: 2 and Propofol infusion and TIVA  Airway Management Planned: Nasal Cannula  Additional Equipment:   Intra-op Plan:   Post-operative Plan:   Informed Consent: I have reviewed the patients History and Physical, chart, labs and discussed the procedure including the risks, benefits and alternatives for the proposed anesthesia with the patient or authorized representative who has indicated his/her understanding and acceptance.       Plan Discussed with:   Anesthesia Plan Comments:         Anesthesia Quick Evaluation

## 2020-07-26 NOTE — Op Note (Signed)
[facility] Gastroenterology Patient Name: Dawn Meza Procedure Date: 07/26/2020 11:55 AM MRN: [id number] Account #: [id number] Date of Birth: 09/17/1995 Admit Type: Outpatient Age: 25 Room: [facility] ENDO ROOM 1 Gender: Female Note Status: Finalized Procedure:             Upper GI endoscopy Indications:           Abdominal pain, Dysphagia, Weight loss Providers:             Tukker Byrns B. Maximino Greenland MD, MD Referring MD:          Sallye Lat Md, MD (Referring MD) Medicines:             Monitored Anesthesia Care Complications:         No immediate complications. Procedure:             Pre-Anesthesia Assessment:                        - Prior to the procedure, a History and Physical was                         performed, and patient medications, allergies and                         sensitivities were reviewed. The patient's tolerance                         of previous anesthesia was reviewed.                        - The risks and benefits of the procedure and the                         sedation options and risks were discussed with the                         patient. All questions were answered and informed                         consent was obtained.                        - Patient identification and proposed procedure were                         verified prior to the procedure by the physician, the                         nurse, the anesthesiologist, the anesthetist and the                         technician. The procedure was verified in the                         procedure room.                        - ASA Grade Assessment: II - A patient with mild  systemic disease.                        After obtaining informed consent, the endoscope was                         passed under direct vision. Throughout the procedure,                         the patient's blood pressure, pulse, and oxygen                         saturations were monitored  continuously. The Endoscope                         was introduced through the mouth, and advanced to the                         second part of duodenum. The upper GI endoscopy was                         accomplished with ease. The patient tolerated the                         procedure well. Findings:      A non-obstructing Schatzki ring was found at the gastroesophageal       junction. A TTS dilator was passed through the scope. Dilation with an       18-19-20 mm balloon dilator was performed to 20 mm. The dilation site       was examined and showed complete resolution of luminal narrowing.      The exam of the esophagus was otherwise normal.      The examined esophagus was normal. Biopsies were obtained from the       proximal and distal esophagus with cold forceps for histology of       suspected eosinophilic esophagitis.      The entire examined stomach was normal. Biopsies were obtained in the       gastric body, at the incisura and in the gastric antrum with cold       forceps for histology. Biopsies were taken with a cold forceps for       Helicobacter pylori testing.      The duodenal bulb, second portion of the duodenum and examined duodenum       were normal. Biopsies for histology were taken with a cold forceps for       evaluation of celiac disease. Impression:            - Non-obstructing Schatzki ring. Dilated.                        - Normal esophagus. Biopsied.                        - Normal stomach. Biopsied.                        - Normal duodenal bulb, second portion of the duodenum                         and examined  duodenum. Biopsied.                        - Biopsies were obtained in the gastric body, at the                         incisura and in the gastric antrum. Recommendation:        - Await pathology results.                        - Take prescribed proton pump inhibitor or H2 blocker                         (antacid) medications 30 - 60 minutes  before meals.                        - Discharge patient to home (with escort).                        - Advance diet as tolerated.                        - Continue present medications.                        - Patient has a contact number available for                         emergencies. The signs and symptoms of potential                         delayed complications were discussed with the patient.                         Return to normal activities tomorrow. Written                         discharge instructions were provided to the patient.                        - Discharge patient to home (with escort).                        - The findings and recommendations were discussed with                         the patient.                        - The findings and recommendations were discussed with                         the patient's family. Procedure Code(s):     --- Professional ---                        [phone], Esophagogastroduodenoscopy, flexible,                         transoral; with transendoscopic balloon dilation of  esophagus (less than 30 mm diameter)                        43239, 59, Esophagogastroduodenoscopy, flexible,                         transoral; with biopsy, single or multiple Diagnosis Code(s):     --- Professional ---                        K22.2, Esophageal obstruction                        R10.9, Unspecified abdominal pain                        R13.10, Dysphagia, unspecified                        R63.4, Abnormal weight loss CPT copyright 2019 American Medical Association. All rights reserved. The codes documented in this report are preliminary and upon coder review may  be revised to meet current compliance requirements.  Melodie Bouillon, MD Michel Bickers B. Maximino Greenland MD, MD 07/26/2020 12:31:35 PM This report has been signed electronically. Number of Addenda: 0 Note Initiated On: 07/26/2020 11:55 AM Estimated Blood Loss:  Estimated  blood loss: none.      [facility]

## 2020-07-26 NOTE — Anesthesia Postprocedure Evaluation (Signed)
Anesthesia Post Note  Patient: Dawn Meza  Procedure(s) Performed: ESOPHAGOGASTRODUODENOSCOPY (EGD) WITH PROPOFOL (N/A )  Patient location during evaluation: Endoscopy Anesthesia Type: General Level of consciousness: awake and alert Pain management: pain level controlled Vital Signs Assessment: post-procedure vital signs reviewed and stable Respiratory status: spontaneous breathing and respiratory function stable Cardiovascular status: stable Anesthetic complications: no   No complications documented.   Last Vitals:  Vitals:   07/26/20 1245 07/26/20 1255  BP: 93/66 (!) 93/57  Pulse: (!) 59 70  Resp:    Temp:    SpO2: 100% 100%    Last Pain:  Vitals:   07/26/20 1255  TempSrc:   PainSc: 1                  Jaylinn Hellenbrand K

## 2020-07-26 NOTE — Transfer of Care (Signed)
Immediate Anesthesia Transfer of Care Note  Patient: York Ram  Procedure(s) Performed: ESOPHAGOGASTRODUODENOSCOPY (EGD) WITH PROPOFOL (N/A )  Patient Location: Endoscopy Unit  Anesthesia Type:General  Level of Consciousness: drowsy  Airway & Oxygen Therapy: Patient Spontanous Breathing  Post-op Assessment: Report given to RN and Post -op Vital signs reviewed and stable  Post vital signs: Reviewed and stable  Last Vitals:  Vitals Value Taken Time  BP 113/97 07/26/20 1059  Temp 37 C 07/26/20 1059  Pulse 93 07/26/20 1059  Resp 18 07/26/20 1059  SpO2 100 % 07/26/20 1059    Last Pain:  Vitals:   07/26/20 1059  TempSrc: Temporal  PainSc: 0-No pain         Complications: No complications documented.

## 2020-07-29 LAB — SURGICAL PATHOLOGY

## 2020-08-01 ENCOUNTER — Ambulatory Visit: Admission: RE | Admit: 2020-08-01 | Payer: Medicaid Other | Source: Ambulatory Visit

## 2020-08-15 ENCOUNTER — Encounter: Payer: Self-pay | Admitting: Gastroenterology

## 2020-08-22 ENCOUNTER — Ambulatory Visit: Payer: Medicaid Other

## 2020-08-26 DIAGNOSIS — M791 Myalgia, unspecified site: Secondary | ICD-10-CM | POA: Diagnosis not present

## 2020-08-26 DIAGNOSIS — Z20822 Contact with and (suspected) exposure to covid-19: Secondary | ICD-10-CM | POA: Diagnosis not present

## 2020-08-26 DIAGNOSIS — J4521 Mild intermittent asthma with (acute) exacerbation: Secondary | ICD-10-CM | POA: Diagnosis not present

## 2020-09-12 ENCOUNTER — Ambulatory Visit: Payer: Medicaid Other

## 2020-09-19 ENCOUNTER — Ambulatory Visit: Payer: Medicaid Other | Admitting: Dietician

## 2020-09-24 ENCOUNTER — Other Ambulatory Visit: Payer: Self-pay

## 2020-09-24 ENCOUNTER — Ambulatory Visit
Admission: RE | Admit: 2020-09-24 | Discharge: 2020-09-24 | Disposition: A | Discharge status: Home / Self Care | Payer: Medicaid Other | Source: Ambulatory Visit | Attending: Gastroenterology | Admitting: Gastroenterology

## 2020-09-24 DIAGNOSIS — R1032 Left lower quadrant pain: Secondary | ICD-10-CM | POA: Insufficient documentation

## 2020-09-24 DIAGNOSIS — R109 Unspecified abdominal pain: Secondary | ICD-10-CM | POA: Diagnosis not present

## 2020-09-24 MED ORDER — IOHEXOL 300 MG/ML  SOLN
75.0000 mL | Freq: Once | INTRAMUSCULAR | Status: AC | PRN
  Administered 2020-09-24: 75 mL via INTRAVENOUS

## 2020-09-25 ENCOUNTER — Encounter: Payer: Self-pay | Admitting: Obstetrics and Gynecology

## 2020-09-25 ENCOUNTER — Other Ambulatory Visit (HOSPITAL_COMMUNITY)
Admission: RE | Admit: 2020-09-25 | Discharge: 2020-09-25 | Disposition: A | Discharge status: Home / Self Care | Payer: Medicaid Other | Source: Ambulatory Visit | Attending: Obstetrics and Gynecology | Admitting: Obstetrics and Gynecology

## 2020-09-25 ENCOUNTER — Ambulatory Visit (INDEPENDENT_AMBULATORY_CARE_PROVIDER_SITE_OTHER): Payer: Medicaid Other | Admitting: Obstetrics and Gynecology

## 2020-09-25 VITALS — BP 96/62 | HR 90 | Ht 60.0 in | Wt 81.0 lb

## 2020-09-25 DIAGNOSIS — Z124 Encounter for screening for malignant neoplasm of cervix: Secondary | ICD-10-CM | POA: Diagnosis not present

## 2020-09-25 DIAGNOSIS — Z30013 Encounter for initial prescription of injectable contraceptive: Secondary | ICD-10-CM | POA: Diagnosis not present

## 2020-09-25 DIAGNOSIS — Z01419 Encounter for gynecological examination (general) (routine) without abnormal findings: Secondary | ICD-10-CM

## 2020-09-25 DIAGNOSIS — Z113 Encounter for screening for infections with a predominantly sexual mode of transmission: Secondary | ICD-10-CM | POA: Insufficient documentation

## 2020-09-25 DIAGNOSIS — N921 Excessive and frequent menstruation with irregular cycle: Secondary | ICD-10-CM | POA: Diagnosis not present

## 2020-09-25 MED ORDER — MEDROXYPROGESTERONE ACETATE 150 MG/ML IM SUSY: 150.0000 mg | PREFILLED_SYRINGE | Freq: Once | INTRAMUSCULAR | 3 refills | Status: DC

## 2020-09-25 NOTE — Progress Notes (Signed)
PCP:  Drosinis, Leonia Reader, PA-C   Chief Complaint  Patient presents with  . Gynecologic Exam    Annual - discuss birth control options. RM 14      HPI:      Dawn Meza is a 25 y.o. G1P0 whose LMP was Patient's last menstrual period was 09/22/2020., presents today for her annual examination.  Her menses are regular every 28-30 days, lasting 7-8 days, heavy flow, changing products Q30-40 min for 2 heavy days, with clots.  Dysmenorrhea mod to severe, sometimes missing activities. She does not have intermenstrual bleeding.  Sex activity: single partner, contraception - none. Does NFP and Plan B prn. No pain/bleeding. Would like to start [facility] for contraception and cycles. No hx of HTN, DVTs, but does have a hx of migraines with aura. Got pregnant on OCPs in past. Interested in depo or nexplanon. Did depo in past without side effects.  Last Pap: 05/16/18  Results were: no abnormalities  Hx of STDs: HSV  There is no FH of breast cancer. There is no FH of ovarian cancer. The patient does not do self-breast exams.  Tobacco use: vapes daily  Alcohol use: none No drug use.  Exercise: moderately active  She does get adequate calcium but not Vitamin D in her diet.  Pt seeing GI for wt loss even with increased caloric intake.  Past Medical History:  Diagnosis Date  . Anxiety   . Asthma   . Depression   . Dysmenorrhea   . Migraine with aura   . Ovarian cyst   . Suicide attempt [facility])     Past Surgical History:  Procedure Laterality Date  . APPENDECTOMY    . ESOPHAGOGASTRODUODENOSCOPY (EGD) WITH PROPOFOL N/A 07/26/2020   Procedure: ESOPHAGOGASTRODUODENOSCOPY (EGD) WITH PROPOFOL;  Surgeon: Pasty Spillers, MD;  Location: [facility] ENDOSCOPY;  Service: Endoscopy;  Laterality: N/A;    Family History  Problem Relation Age of Onset  . Hypertension Mother   . Heart disease Maternal Grandfather     Social History   Socioeconomic History  . Marital status: Single    Spouse name:  Not on file  . Number of children: Not on file  . Years of education: Not on file  . Highest education level: Not on file  Occupational History  . Not on file  Tobacco Use  . Smoking status: Former Smoker    Types: Cigarettes  . Smokeless tobacco: Never Used  Vaping Use  . Vaping Use: Every day  . Substances: Nicotine  Substance and Sexual Activity  . Alcohol use: No  . Drug use: Not Currently    Frequency: 7.0 times per week  . Sexual activity: Yes    Partners: Male    Birth control/protection: None  Other Topics Concern  . Not on file  Social History Narrative  . Not on file   Social Determinants of Health   Financial Resource Strain: Not on file  Food Insecurity: Not on file  Transportation Needs: Not on file  Physical Activity: Not on file  Stress: Not on file  Social Connections: Not on file  Intimate Partner Violence: Not on file     Current Outpatient Medications:  .  medroxyPROGESTERone Acetate 150 MG/ML SUSY, Inject 1 mL (150 mg total) into the muscle once for 1 dose., Disp: 1 mL, Rfl: 3 .  albuterol (VENTOLIN HFA) 108 (90 Base) MCG/ACT inhaler, Inhale 1 puff into the lungs every 6 (six) hours as needed., Disp: , Rfl:  .  omeprazole (PRILOSEC) 20 MG capsule, Take 1 capsule (20 mg total) by mouth daily., Disp: 30 capsule, Rfl: 0 .  ondansetron (ZOFRAN-ODT) 4 MG disintegrating tablet, Take 1 tablet (4 mg total) by mouth every 8 (eight) hours as needed. (Patient not taking: Reported on 09/25/2020), Disp: 20 tablet, Rfl: 0 .  phenazopyridine (PYRIDIUM) 100 MG tablet, Take 1 tablet (100 mg total) by mouth 3 (three) times daily as needed for pain. (Patient not taking: Reported on 09/25/2020), Disp: 10 tablet, Rfl: 0 .  terbinafine (LAMISIL) 250 MG tablet, Take 1 tablet (250 mg total) by mouth daily. (Patient not taking: Reported on 07/23/2020), Disp: 90 tablet, Rfl: 0     ROS:  Review of Systems  Constitutional: Positive for unexpected weight change. Negative for  fatigue and fever.  Respiratory: Negative for cough, shortness of breath and wheezing.   Cardiovascular: Negative for chest pain, palpitations and leg swelling.  Gastrointestinal: Negative for blood in stool, constipation, diarrhea, nausea and vomiting.  Endocrine: Negative for cold intolerance, heat intolerance and polyuria.  Genitourinary: Negative for dyspareunia, dysuria, flank pain, frequency, genital sores, hematuria, menstrual problem, pelvic pain, urgency, vaginal bleeding, vaginal discharge and vaginal pain.  Musculoskeletal: Negative for back pain, joint swelling and myalgias.  Skin: Negative for rash.  Neurological: Negative for dizziness, syncope, light-headedness, numbness and headaches.  Hematological: Negative for adenopathy.  Psychiatric/Behavioral: Negative for agitation, confusion, sleep disturbance and suicidal ideas. The patient is not nervous/anxious.    BREAST: No symptoms   Objective: BP 96/62   Pulse 90   Ht 5' (1.524 m)   Wt 81 lb (36.7 kg)   LMP 09/22/2020   BMI 15.82 kg/m    Physical Exam Constitutional:      Appearance: She is well-developed.  Genitourinary:     Vulva normal.     Right Labia: No rash, tenderness or lesions.    Left Labia: No tenderness, lesions or rash.    No vaginal discharge, erythema or tenderness.      Right Adnexa: not tender and no mass present.    Left Adnexa: not tender and no mass present.    No cervical friability or polyp.     Uterus is not enlarged or tender.  Breasts:     Right: No mass, nipple discharge, skin change or tenderness.     Left: No mass, nipple discharge, skin change or tenderness.    Neck:     Thyroid: No thyromegaly.  Cardiovascular:     Rate and Rhythm: Normal rate and regular rhythm.     Heart sounds: Normal heart sounds. No murmur heard.   Pulmonary:     Effort: Pulmonary effort is normal.     Breath sounds: Normal breath sounds.  Abdominal:     Palpations: Abdomen is soft.      Tenderness: There is no abdominal tenderness. There is no guarding or rebound.  Musculoskeletal:        General: Normal range of motion.     Cervical back: Normal range of motion.  Lymphadenopathy:     Cervical: No cervical adenopathy.  Neurological:     General: No focal deficit present.     Mental Status: She is alert and oriented to person, place, and time.     Cranial Nerves: No cranial nerve deficit.  Skin:    General: Skin is warm and dry.  Psychiatric:        Mood and Affect: Mood normal.        Behavior: Behavior normal.  Thought Content: Thought content normal.        Judgment: Judgment normal.  Vitals reviewed.     Assessment/Plan: Encounter for annual routine gynecological examination  Cervical cancer screening - Plan: Cytology - PAP  Screening for STD (sexually transmitted disease) - Plan: Cytology - PAP  Encounter for initial prescription of injectable contraceptive - Plan: medroxyPROGESTERone Acetate 150 MG/ML SUSY; depo discussed, Rx eRxd. RTO with next menses for injection. Condoms for 1 wk. Cont ca/Vit D. F/u prn.  Menometrorrhagia--try depo. F/u prn   Meds ordered this encounter  Medications  . medroxyPROGESTERone Acetate 150 MG/ML SUSY    Sig: Inject 1 mL (150 mg total) into the muscle once for 1 dose.    Dispense:  1 mL    Refill:  3    Order Specific Question:   Supervising Provider    Answer:   Nadara Mustard [[id number]]             GYN counsel family planning choices, adequate intake of calcium and vitamin D, diet and exercise     F/U  Return in about 1 year (around 09/25/2021).  Marne Meline B. Dannell Gortney, PA-C 09/26/2020 10:52 AM

## 2020-09-25 NOTE — Patient Instructions (Signed)
I value your feedback and you entrusting us with your care. If you get a [facility] patient survey, I would appreciate you taking the time to let us know about your experience today. Thank you! ? ? ?

## 2020-09-26 ENCOUNTER — Encounter: Payer: Self-pay | Admitting: Obstetrics and Gynecology

## 2020-09-26 DIAGNOSIS — N921 Excessive and frequent menstruation with irregular cycle: Secondary | ICD-10-CM | POA: Insufficient documentation

## 2020-09-27 LAB — CYTOLOGY - PAP
Chlamydia: NEGATIVE
Comment: NEGATIVE
Comment: NORMAL
Diagnosis: NEGATIVE
Neisseria Gonorrhea: NEGATIVE

## 2020-09-30 DIAGNOSIS — Z111 Encounter for screening for respiratory tuberculosis: Secondary | ICD-10-CM | POA: Diagnosis not present

## 2020-10-08 ENCOUNTER — Encounter: Payer: Self-pay | Admitting: Gastroenterology

## 2020-10-17 ENCOUNTER — Other Ambulatory Visit: Payer: Self-pay

## 2020-10-17 ENCOUNTER — Ambulatory Visit (INDEPENDENT_AMBULATORY_CARE_PROVIDER_SITE_OTHER): Payer: Medicaid Other

## 2020-10-17 DIAGNOSIS — Z3042 Encounter for surveillance of injectable contraceptive: Secondary | ICD-10-CM | POA: Diagnosis not present

## 2020-10-17 MED ORDER — MEDROXYPROGESTERONE ACETATE 150 MG/ML IM SUSP
150.0000 mg | Freq: Once | INTRAMUSCULAR | Status: AC
  Administered 2020-10-17: 150 mg via INTRAMUSCULAR

## 2020-10-17 NOTE — Progress Notes (Signed)
Pt here for depo; LMP 10/09/20; Depo given IM right glut.  NDC# [phone]  Pt felt lightheaded afterward; sat down until she felt better; had not eaten lunch; adv to eat before coming in for inj.

## 2020-10-29 ENCOUNTER — Ambulatory Visit: Payer: Medicaid Other | Admitting: Gastroenterology

## 2020-10-29 ENCOUNTER — Other Ambulatory Visit: Payer: Self-pay

## 2020-10-29 ENCOUNTER — Encounter: Payer: Self-pay | Admitting: Gastroenterology

## 2020-10-29 VITALS — BP 98/62 | HR 76 | Temp 98.2°F | Wt 80.0 lb

## 2020-10-29 DIAGNOSIS — K5900 Constipation, unspecified: Secondary | ICD-10-CM | POA: Diagnosis not present

## 2020-10-29 DIAGNOSIS — R1310 Dysphagia, unspecified: Secondary | ICD-10-CM | POA: Diagnosis not present

## 2020-10-29 MED ORDER — FAMOTIDINE 20 MG PO TABS: 20.0000 mg | ORAL_TABLET | Freq: Every day | ORAL | 0 refills | Status: DC

## 2020-10-29 NOTE — Progress Notes (Signed)
Dawn Bouillon, MD [address]  [address]  [place], [place] [address]  Main: [phone]  Fax: [phone]   Primary Care Physician: Drosinis, Leonia Reader, PA-C   Chief complaint: Heartburn, constipation  HPI: Madhavi Hamblen is a 25 y.o. female here for follow-up with previous history of dysphagia and abdominal pain.  Patient underwent upper endoscopy in March 2022 with dilation of Schatzki's ring to 20 mm and biopsies negative for H. pylori.  Since this procedure patient states her dysphagia had resolved.  PPI was prescribed but patient states she never picked up the prescription.  She states that as of the last week, she has noted return of symptoms mildly but intermittently.  Also has reported heartburn over the last week.  Is not taking any medications for it.  She had also previously complained of inability to gain weight and we had referred her to a nutritionist and she never followed up with them.  She states she made an appointment but canceled it as she could not make it to that appointment  Her abdominal pain has resolved but she did have an episode of constipation 4 to 5 days ago where she states she feels constipated and sat on the toilet for 1 hour to allow herself to have a bowel movement and was pushing and straining during that time.  Since then, she is back to baseline is having 1 soft bowel movement daily since then.  No nausea or vomiting.  Current Outpatient Medications  Medication Sig Dispense Refill   albuterol (VENTOLIN HFA) 108 (90 Base) MCG/ACT inhaler Inhale 1 puff into the lungs every 6 (six) hours as needed.     famotidine (PEPCID) 20 MG tablet Take 1 tablet (20 mg total) by mouth daily. 30 tablet 0   medroxyPROGESTERone Acetate 150 MG/ML SUSY Inject 1 mL (150 mg total) into the muscle once for 1 dose. 1 mL 3   Multiple Vitamins-Minerals (WOMENS DAILY FORMULA PO) Take by mouth.     ondansetron (ZOFRAN-ODT) 4 MG disintegrating tablet Take 1 tablet (4 mg  total) by mouth every 8 (eight) hours as needed. (Patient not taking: Reported on 10/29/2020) 20 tablet 0   No current facility-administered medications for this visit.    Allergies as of 10/29/2020   (No Known Allergies)    ROS: All ROS reviewed a negative except as per HPI   Physical Examination:   BP 98/62   Pulse 76   Temp 98.2 F (36.8 C) (Oral)   Wt 80 lb (36.3 kg)   BMI 15.62 kg/m   Constitutional: General:   Alert,  Well-developed, well-nourished, pleasant and cooperative in NAD BP 98/62   Pulse 76   Temp 98.2 F (36.8 C) (Oral)   Wt 80 lb (36.3 kg)   BMI 15.62 kg/m   Eyes:  Sclera clear, no icterus.   Conjunctiva pink. PERRLA  Ears:  No scars, lesions or masses, Normal auditory acuity. Nose:  No deformity, discharge, or lesions. Mouth:  No deformity or lesions, oropharynx pink & moist.  Neck:  Supple; no masses or thyromegaly.  Respiratory: Normal respiratory effort, Normal percussion  Gastrointestinal:  Normal bowel sounds.  No bruits.  Soft, non-tender and non-distended without masses, hepatosplenomegaly or hernias noted.  No guarding or rebound tenderness.     Cardiac: No clubbing or edema.  No cyanosis. Normal posterior tibial pedal pulses noted.  Lymphatic:  No significant cervical or axillary adenopathy.  Psych:  Alert and cooperative. Normal mood and affect.  Musculoskeletal:  Normal gait. Head normocephalic, atraumatic. Symmetrical without gross deformities. 5/5 Upper and Lower extremity strength bilaterally.  Skin: Warm. Intact without significant lesions or rashes. No jaundice.  Neurologic:  Face symmetrical, tongue midline, Normal sensation to touch;  grossly normal neurologically.  Psych:  Alert and oriented x3, Alert and cooperative. Normal mood and affect.  Labs: CMP     Component Value Date/Time   NA 139 07/20/2020 0745   K 4.2 07/20/2020 0745   CL 107 07/20/2020 0745   CO2 23 07/20/2020 0745   GLUCOSE 100 (H) 07/20/2020 0745    BUN 12 07/20/2020 0745   CREATININE 0.73 07/20/2020 0745   CALCIUM 9.1 07/20/2020 0745   PROT 7.2 05/21/2020 1346   ALBUMIN 4.6 05/21/2020 1346   AST 15 05/21/2020 1346   ALT 12 05/21/2020 1346   ALKPHOS 47 05/21/2020 1346   BILITOT 0.4 05/21/2020 1346   GFRNONAA >60 07/20/2020 0745   GFRAA >60 11/15/2019 1154   Lab Results  Component Value Date   WBC 4.1 07/20/2020   HGB 12.0 07/20/2020   HCT 37.8 07/20/2020   MCV 86.7 07/20/2020   PLT 272 07/20/2020    Imaging Studies: No results found.  Assessment and Plan:   Erinne Gillentine is a 25 y.o. y/o female with previous history of dysphagia and Schatzki's ring, abdominal pain, here for follow-up  Abdominal pain has resolved She had 1 episode of constipation last week and she attributed to not eating enough fiber for 1 to 2 days prior to the episode.  Daily fiber intake discussed and patient's bowel movements are back to baseline with 1 soft bowel movement a day since then.  If symptoms return patient vies to call us back.  Dysphagia has improved since dilation of Schatzki's ring.  Patient never picked up her PPI after the procedure.  She has had return of some heartburn over the last week as well.  We will prescribe H2 RA therapy instead of PPI  Patient educated extensively on acid reflux lifestyle modification, including buying a bed wedge, not eating 3 hrs before bedtime, diet modifications, and handout given for the same.   She is otherwise a young healthy female and some of her abdominal symptoms can be attributed to underlying anxiety and depression this was discussed with her.  She has previous history of suicide attempt as well.  Patient advised to work with PCP on anxiety and depression issues as this may help with her GI issues overall as well  Patient encouraged to follow-up with nutritionist as was previously referred  Overall symptoms improving at this time  Follow-up in 3 months or earlier if needed  Dr Dawn Meza

## 2020-10-30 ENCOUNTER — Ambulatory Visit: Payer: Medicaid Other | Admitting: Dietician

## 2020-11-18 DIAGNOSIS — K2970 Gastritis, unspecified, without bleeding: Secondary | ICD-10-CM | POA: Diagnosis not present

## 2020-11-18 DIAGNOSIS — G43909 Migraine, unspecified, not intractable, without status migrainosus: Secondary | ICD-10-CM | POA: Diagnosis not present

## 2020-11-18 DIAGNOSIS — Z20822 Contact with and (suspected) exposure to covid-19: Secondary | ICD-10-CM | POA: Diagnosis not present

## 2020-11-21 NOTE — Progress Notes (Deleted)
    Drosinis, Leonia Reader, PA-C   No chief complaint on file.   HPI:      [name] is a 25 y.o. G1P0 whose LMP was No LMP recorded., presents today for *** Started depo 5/22 for menometrorrhagia, hx of migraines with aura   Past Medical History:  Diagnosis Date   Anxiety    Asthma    Depression    Dysmenorrhea    Migraine with aura    Ovarian cyst    Suicide attempt [facility])     Past Surgical History:  Procedure Laterality Date   APPENDECTOMY     ESOPHAGOGASTRODUODENOSCOPY (EGD) WITH PROPOFOL N/A 07/26/2020   Procedure: ESOPHAGOGASTRODUODENOSCOPY (EGD) WITH PROPOFOL;  Surgeon: Pasty Spillers, MD;  Location: [facility] ENDOSCOPY;  Service: Endoscopy;  Laterality: N/A;    Family History  Problem Relation Age of Onset   Hypertension Mother    Heart disease Maternal Grandfather     Social History   Socioeconomic History   Marital status: Single    Spouse name: Not on file   Number of children: Not on file   Years of education: Not on file   Highest education level: Not on file  Occupational History   Not on file  Tobacco Use   Smoking status: Former    Pack years: 0.00    Types: Cigarettes   Smokeless tobacco: Never  Vaping Use   Vaping Use: Every day   Substances: Nicotine  Substance and Sexual Activity   Alcohol use: No   Drug use: Not Currently    Frequency: 7.0 times per week   Sexual activity: Yes    Partners: Male    Birth control/protection: None  Other Topics Concern   Not on file  Social History Narrative   Not on file   Social Determinants of Health   Financial Resource Strain: Not on file  Food Insecurity: Not on file  Transportation Needs: Not on file  Physical Activity: Not on file  Stress: Not on file  Social Connections: Not on file  Intimate Partner Violence: Not on file    Outpatient Medications Prior to Visit  Medication Sig Dispense Refill   albuterol (VENTOLIN HFA) 108 (90 Base) MCG/ACT inhaler Inhale 1 puff into the  lungs every 6 (six) hours as needed.     famotidine (PEPCID) 20 MG tablet Take 1 tablet (20 mg total) by mouth daily. 30 tablet 0   medroxyPROGESTERone Acetate 150 MG/ML SUSY Inject 1 mL (150 mg total) into the muscle once for 1 dose. 1 mL 3   Multiple Vitamins-Minerals (WOMENS DAILY FORMULA PO) Take by mouth.     ondansetron (ZOFRAN-ODT) 4 MG disintegrating tablet Take 1 tablet (4 mg total) by mouth every 8 (eight) hours as needed. (Patient not taking: Reported on 10/29/2020) 20 tablet 0   No facility-administered medications prior to visit.      ROS:  Review of Systems BREAST: No symptoms   OBJECTIVE:   Vitals:  There were no vitals taken for this visit.  Physical Exam  Results: No results found for this or any previous visit (from the past 24 hour(s)).   Assessment/Plan: No diagnosis found.    No orders of the defined types were placed in this encounter.     No follow-ups on file.  Christian Treadway B. Antinette Keough, PA-C 11/21/2020 6:23 PM

## 2020-11-22 ENCOUNTER — Ambulatory Visit: Payer: Medicaid Other | Admitting: Obstetrics and Gynecology

## 2020-12-04 NOTE — Progress Notes (Signed)
Drosinis, Leonia Reader, PA-C   Chief Complaint  Patient presents with   Vaginal Bleeding    Pt has been bleeding non stop for the past 2 months, no abnormal pain   Vaginal Pain    During intercourse    HPI:      [name] is a 25 y.o. G1P0 whose LMP was No LMP recorded. Patient has had an injection., presents today for BTB with depo. Having light, brown/dark d/c. Started depo 5/22 for menometrorrhagia, hx of migraines with aura. Next shot due 8/22. Doing well otherwise with it.  Also had vaginal pain and swelling for 1 episode with sex recently. Partner is out of state and they don't see each other often. Had external pain and dryness and pt concerned it's a side effect of depo. Encounter happened quickly without usual lubrication; didn't use lubricant and noticed vaginal swelling afterwards. Sx resolved and hasn't been sex active since. No other vag sx, no sx since. Hasn't been sex active since. No new partners. Neg pap/neg STD testing 5/22  Past Medical History:  Diagnosis Date   Anxiety    Asthma    Depression    Dysmenorrhea    Migraine with aura    Ovarian cyst    Suicide attempt [facility])     Past Surgical History:  Procedure Laterality Date   APPENDECTOMY     ESOPHAGOGASTRODUODENOSCOPY (EGD) WITH PROPOFOL N/A 07/26/2020   Procedure: ESOPHAGOGASTRODUODENOSCOPY (EGD) WITH PROPOFOL;  Surgeon: Pasty Spillers, MD;  Location: [facility] ENDOSCOPY;  Service: Endoscopy;  Laterality: N/A;    Family History  Problem Relation Age of Onset   Hypertension Mother    Heart disease Maternal Grandfather     Social History   Socioeconomic History   Marital status: Single    Spouse name: Not on file   Number of children: Not on file   Years of education: Not on file   Highest education level: Not on file  Occupational History   Not on file  Tobacco Use   Smoking status: Former    Types: Cigarettes   Smokeless tobacco: Never  Vaping Use   Vaping Use: Every day    Substances: Nicotine  Substance and Sexual Activity   Alcohol use: No   Drug use: Not Currently    Frequency: 7.0 times per week   Sexual activity: Yes    Partners: Male    Birth control/protection: Injection  Other Topics Concern   Not on file  Social History Narrative   Not on file   Social Determinants of Health   Financial Resource Strain: Not on file  Food Insecurity: Not on file  Transportation Needs: Not on file  Physical Activity: Not on file  Stress: Not on file  Social Connections: Not on file  Intimate Partner Violence: Not on file    Outpatient Medications Prior to Visit  Medication Sig Dispense Refill   albuterol (VENTOLIN HFA) 108 (90 Base) MCG/ACT inhaler Inhale 1 puff into the lungs every 6 (six) hours as needed.     Multiple Vitamins-Minerals (WOMENS DAILY FORMULA PO) Take by mouth.     medroxyPROGESTERone Acetate 150 MG/ML SUSY Inject 1 mL (150 mg total) into the muscle once for 1 dose. 1 mL 3   famotidine (PEPCID) 20 MG tablet Take 1 tablet (20 mg total) by mouth daily. 30 tablet 0   ondansetron (ZOFRAN-ODT) 4 MG disintegrating tablet Take 1 tablet (4 mg total) by mouth every 8 (eight) hours as needed. (Patient  not taking: Reported on 10/29/2020) 20 tablet 0   No facility-administered medications prior to visit.      ROS:  Review of Systems  Constitutional:  Negative for fever.  Gastrointestinal:  Negative for blood in stool, constipation, diarrhea, nausea and vomiting.  Genitourinary:  Positive for dyspareunia, vaginal bleeding and vaginal pain. Negative for dysuria, flank pain, frequency, hematuria, urgency and vaginal discharge.  Musculoskeletal:  Negative for back pain.  Skin:  Negative for rash.  BREAST: No symptoms   OBJECTIVE:   Vitals:  BP 100/70   Ht 5' (1.524 m)   Wt 81 lb (36.7 kg)   Breastfeeding No   BMI 15.82 kg/m   Physical Exam Vitals reviewed.  Constitutional:      Appearance: She is well-developed.  Pulmonary:      Effort: Pulmonary effort is normal.  Genitourinary:    General: Normal vulva.     Pubic Area: No rash.      Labia:        Right: No rash, tenderness or lesion.        Left: No rash, tenderness or lesion.      Vagina: Normal. No vaginal discharge, erythema or tenderness.     Cervix: Normal.  Musculoskeletal:        General: Normal range of motion.     Cervical back: Normal range of motion.  Skin:    General: Skin is warm and dry.  Neurological:     General: No focal deficit present.     Mental Status: She is alert and oriented to person, place, and time.  Psychiatric:        Mood and Affect: Mood normal.        Behavior: Behavior normal.        Thought Content: Thought content normal.        Judgment: Judgment normal.    Assessment/Plan: Breakthrough bleeding on depo provera--has only had 1 injection so far. Reassurance. Sx should improve with subsequent injections. F/u prn.   Dyspareunia in female--x 1 episode. Most likely due to decreased lubrication (not related to depo). Add lubricants, f/u prn.     Return if symptoms worsen or fail to improve.  Cassadee Vanzandt B. Miner Koral, PA-C 12/05/2020 10:18 AM

## 2020-12-05 ENCOUNTER — Encounter: Payer: Self-pay | Admitting: Obstetrics and Gynecology

## 2020-12-05 ENCOUNTER — Ambulatory Visit: Payer: Medicaid Other | Admitting: Obstetrics and Gynecology

## 2020-12-05 ENCOUNTER — Other Ambulatory Visit: Payer: Self-pay

## 2020-12-05 VITALS — BP 100/70 | Ht 60.0 in | Wt 81.0 lb

## 2020-12-05 DIAGNOSIS — N941 Unspecified dyspareunia: Secondary | ICD-10-CM | POA: Diagnosis not present

## 2020-12-05 DIAGNOSIS — N921 Excessive and frequent menstruation with irregular cycle: Secondary | ICD-10-CM | POA: Diagnosis not present

## 2021-01-09 ENCOUNTER — Other Ambulatory Visit: Payer: Self-pay

## 2021-01-09 ENCOUNTER — Other Ambulatory Visit: Payer: Self-pay | Admitting: Obstetrics and Gynecology

## 2021-01-09 ENCOUNTER — Ambulatory Visit (INDEPENDENT_AMBULATORY_CARE_PROVIDER_SITE_OTHER): Payer: Medicaid Other

## 2021-01-09 DIAGNOSIS — Z3042 Encounter for surveillance of injectable contraceptive: Secondary | ICD-10-CM

## 2021-01-09 DIAGNOSIS — Z30013 Encounter for initial prescription of injectable contraceptive: Secondary | ICD-10-CM

## 2021-01-09 MED ORDER — MEDROXYPROGESTERONE ACETATE 150 MG/ML IM SUSP
150.0000 mg | Freq: Once | INTRAMUSCULAR | Status: AC
  Administered 2021-01-09: 150 mg via INTRAMUSCULAR

## 2021-02-03 ENCOUNTER — Encounter: Payer: Self-pay | Admitting: Gastroenterology

## 2021-02-03 ENCOUNTER — Other Ambulatory Visit: Payer: Self-pay

## 2021-02-03 ENCOUNTER — Ambulatory Visit (INDEPENDENT_AMBULATORY_CARE_PROVIDER_SITE_OTHER): Payer: Medicaid Other | Admitting: Gastroenterology

## 2021-02-03 VITALS — BP 118/77 | HR 75 | Temp 97.6°F | Wt 83.2 lb

## 2021-02-03 DIAGNOSIS — R1310 Dysphagia, unspecified: Secondary | ICD-10-CM | POA: Diagnosis not present

## 2021-02-03 DIAGNOSIS — R109 Unspecified abdominal pain: Secondary | ICD-10-CM | POA: Diagnosis not present

## 2021-02-03 NOTE — Progress Notes (Signed)
Dawn Bouillon, MD [address]  [address]  [place], [place] [address]  Main: [phone]  Fax: [phone]   Primary Care Physician: Drosinis, Leonia Reader, PA-C   Chief complaint: Follow-up for abdominal pain  HPI: Dawn Meza is a 25 y.o. female here for follow-up of abdominal pain and dysphagia.  Patient describes resolution of abdominal pain and dysphagia at this time.  Has not had any recurrence of dysphagia since dilation of Schatzki's ring.  Is denying any heartburn.  Nausea or vomiting.  Had an episode of constipation last week, but this has resolved.  No blood in stool.  Good appetite     ROS: All ROS reviewed and negative except as per HPI   Past Medical History:  Diagnosis Date   Anxiety    Asthma    Depression    Dysmenorrhea    Migraine with aura    Ovarian cyst    Suicide attempt [facility])     Past Surgical History:  Procedure Laterality Date   APPENDECTOMY     ESOPHAGOGASTRODUODENOSCOPY (EGD) WITH PROPOFOL N/A 07/26/2020   Procedure: ESOPHAGOGASTRODUODENOSCOPY (EGD) WITH PROPOFOL;  Surgeon: Pasty Spillers, MD;  Location: [facility] ENDOSCOPY;  Service: Endoscopy;  Laterality: N/A;    Prior to Admission medications   Medication Sig Start Date End Date Taking? Authorizing Provider  albuterol (VENTOLIN HFA) 108 (90 Base) MCG/ACT inhaler Inhale 1 puff into the lungs every 6 (six) hours as needed. 09/13/19  Yes [provider]  medroxyPROGESTERone Acetate 150 MG/ML SUSY Inject 1 mL (150 mg total) into the muscle once for 1 dose. 09/25/20 10/29/20  Copland, Ilona Sorrel, PA-C    Family History  Problem Relation Age of Onset   Hypertension Mother    Heart disease Maternal Grandfather      Social History   Tobacco Use   Smoking status: Former    Types: Cigarettes   Smokeless tobacco: Never  Vaping Use   Vaping Use: Every day   Substances: Nicotine  Substance Use Topics   Alcohol use: No   Drug use: Not Currently    Frequency: 7.0 times per  week    Allergies as of 02/03/2021   (No Known Allergies)    Physical Examination:  Constitutional: General:   Alert,  Well-developed, well-nourished, pleasant and cooperative in NAD BP 118/77   Pulse 75   Temp 97.6 F (36.4 C) (Oral)   Wt 83 lb 3.2 oz (37.7 kg)   BMI 16.25 kg/m   Respiratory: Normal respiratory effort  Gastrointestinal:  Soft, non-tender and non-distended without masses, hepatosplenomegaly or hernias noted.  No guarding or rebound tenderness.     Cardiac: No clubbing or edema.  No cyanosis. Normal posterior tibial pedal pulses noted.  Psych:  Alert and cooperative. Normal mood and affect.  Musculoskeletal:  Normal gait. Head normocephalic, atraumatic. Symmetrical without gross deformities. 5/5 Lower extremity strength bilaterally.  Skin: Warm. Intact without significant lesions or rashes. No jaundice.  Neck: Supple, trachea midline  Lymph: No cervical lymphadenopathy  Psych:  Alert and oriented x3, Alert and cooperative. Normal mood and affect.  Labs: CMP     Component Value Date/Time   NA 139 07/20/2020 0745   K 4.2 07/20/2020 0745   CL 107 07/20/2020 0745   CO2 23 07/20/2020 0745   GLUCOSE 100 (H) 07/20/2020 0745   BUN 12 07/20/2020 0745   CREATININE 0.73 07/20/2020 0745   CALCIUM 9.1 07/20/2020 0745   PROT 7.2 05/21/2020 1346   ALBUMIN 4.6  05/21/2020 1346   AST 15 05/21/2020 1346   ALT 12 05/21/2020 1346   ALKPHOS 47 05/21/2020 1346   BILITOT 0.4 05/21/2020 1346   GFRNONAA >60 07/20/2020 0745   GFRAA >60 11/15/2019 1154   Lab Results  Component Value Date   WBC 4.1 07/20/2020   HGB 12.0 07/20/2020   HCT 37.8 07/20/2020   MCV 86.7 07/20/2020   PLT 272 07/20/2020    Imaging Studies:   Assessment and Plan:   Dawn Meza is a 25 y.o. y/o female here for follow-up of dysphagia, abdominal pain  Abdominal pain has resolved Continue high-fiber diet No alarm symptoms present  Dysphagia has resolved after dilation of Schatzki's  ring She is not taking any medications and is not having any heartburn or reflux symptoms.  Starting PPI therapy for this asymptomatic young patient at this time, and continuing it for a long time can increase the risks of adverse effects with longer term therapy  Since she is asymptomatic, it would be best to continue lifestyle modifications at this time, and these were discussed with her in detail. However, if reflux symptoms return patient advised to call us and short-term intermittent medications can be considered  Patient educated extensively on acid reflux lifestyle modification, including using a bed wedge if needed, not eating 3 hrs before bedtime, diet modifications, and handout given for the same.   Patient would like to follow-up as needed given that her symptoms have resolved and was encouraged to call us with any questions or concerns   Dr Dawn Meza

## 2021-03-18 ENCOUNTER — Telehealth: Payer: Self-pay

## 2021-03-18 DIAGNOSIS — B009 Herpesviral infection, unspecified: Secondary | ICD-10-CM

## 2021-03-18 NOTE — Telephone Encounter (Signed)
We didn't give it to her bc not on med list. (Doesn't look like we delivered her). What dose is she on and how frequently does she take it? Thx.

## 2021-03-18 NOTE — Telephone Encounter (Signed)
Pt calling for refill of valacyclovir; it was given to her one month before delivery; is not showing up in myChart.  [phone]

## 2021-03-19 DIAGNOSIS — B009 Herpesviral infection, unspecified: Secondary | ICD-10-CM | POA: Insufficient documentation

## 2021-03-19 MED ORDER — VALACYCLOVIR HCL 500 MG PO TABS: 500.0000 mg | ORAL_TABLET | Freq: Every day | ORAL | 0 refills | Status: DC

## 2021-03-19 NOTE — Telephone Encounter (Signed)
Pt aware.

## 2021-03-19 NOTE — Telephone Encounter (Signed)
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

## 2021-03-19 NOTE — Telephone Encounter (Signed)
Rx valtrex eRxd.  

## 2021-03-28 ENCOUNTER — Other Ambulatory Visit: Payer: Self-pay | Admitting: Obstetrics and Gynecology

## 2021-03-28 DIAGNOSIS — Z30013 Encounter for initial prescription of injectable contraceptive: Secondary | ICD-10-CM

## 2021-04-03 ENCOUNTER — Ambulatory Visit (INDEPENDENT_AMBULATORY_CARE_PROVIDER_SITE_OTHER): Payer: Medicaid Other

## 2021-04-03 ENCOUNTER — Other Ambulatory Visit: Payer: Self-pay

## 2021-04-03 DIAGNOSIS — Z3042 Encounter for surveillance of injectable contraceptive: Secondary | ICD-10-CM

## 2021-04-03 MED ORDER — MEDROXYPROGESTERONE ACETATE 150 MG/ML IM SUSP
150.0000 mg | Freq: Once | INTRAMUSCULAR | Status: AC
  Administered 2021-04-03: 11:00:00 150 mg via INTRAMUSCULAR

## 2021-06-01 ENCOUNTER — Other Ambulatory Visit: Payer: Self-pay | Admitting: Obstetrics and Gynecology

## 2021-06-01 DIAGNOSIS — Z30013 Encounter for initial prescription of injectable contraceptive: Secondary | ICD-10-CM

## 2021-06-02 MED ORDER — MEDROXYPROGESTERONE ACETATE 150 MG/ML IM SUSY: 150.0000 mg | PREFILLED_SYRINGE | Freq: Once | INTRAMUSCULAR | 3 refills | Status: DC

## 2021-06-08 ENCOUNTER — Other Ambulatory Visit: Payer: Self-pay

## 2021-06-08 ENCOUNTER — Ambulatory Visit
Admission: EM | Admit: 2021-06-08 | Discharge: 2021-06-08 | Disposition: A | Discharge status: Home / Self Care | Payer: Medicaid Other | Attending: Family Medicine | Admitting: Family Medicine

## 2021-06-08 DIAGNOSIS — J029 Acute pharyngitis, unspecified: Secondary | ICD-10-CM

## 2021-06-08 LAB — POC INFLUENZA A AND B ANTIGEN (URGENT CARE ONLY)
Influenza A Ag: NEGATIVE
Influenza B Ag: NEGATIVE

## 2021-06-08 LAB — POCT RAPID STREP A (OFFICE): Rapid Strep A Screen: NEGATIVE

## 2021-06-08 MED ORDER — AMOXICILLIN 875 MG PO TABS: 875.0000 mg | ORAL_TABLET | Freq: Two times a day (BID) | ORAL | 0 refills | Status: DC

## 2021-06-08 NOTE — ED Triage Notes (Signed)
Patient presents to Urgent Care with complaints of fever, body aches, joint paint, and sore throat since Friday. Concerned with strep. Treating symptoms with ibuprofen.

## 2021-06-08 NOTE — ED Provider Notes (Signed)
[facility]    CSN: [id number] Arrival date & time: 06/08/21  1013      History   Chief Complaint Chief Complaint  Patient presents with   Fever   Sore Throat   Generalized Body Aches    HPI Dawn Meza is a 26 y.o. female.   HPI Patient presents today with fever, sore throat, generalized body aches. Patient is concern for possible strep. Throat pain is her most worrisome symptom. She had one episode of fever last night 100.3 which resolved with ibuprofen. Her daughter has been sick with URI and family recently had strep whom she had contact with. No cough, nausea, or vomiting.  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   Menometrorrhagia 09/26/2020   Other dysphagia    Loss of weight    Abdominal pain    Lower esophageal ring (Schatzki)    Acute bacterial sinusitis 09/10/2018   Mild intermittent asthma without complication [id number]   Genital herpes affecting pregnancy in third trimester 02/14/2018   Herpes simplex vulvovaginitis 02/14/2018   Depression affecting pregnancy in second trimester, antepartum 01/19/2018   Uterine size date discrepancy pregnancy, second trimester 01/19/2018   Two vessel umbilical cord, antepartum, single gestation 12/21/2017   Seasonal allergies 10/12/2017   History of suicide attempt 08/27/2017   Supervision of high risk pregnancy, antepartum 08/27/2017   Adjustment disorder with mixed anxiety and depressed mood 06/17/2016    Past Surgical History:  Procedure Laterality Date   APPENDECTOMY     ESOPHAGOGASTRODUODENOSCOPY (EGD) WITH PROPOFOL N/A 07/26/2020   Procedure: ESOPHAGOGASTRODUODENOSCOPY (EGD) WITH PROPOFOL;  Surgeon: Virgel Manifold, MD;  Location: [facility] ENDOSCOPY;  Service: Endoscopy;  Laterality: N/A;    OB  History     Gravida  1   Para      Term      Preterm      AB      Living  1      SAB      IAB      Ectopic      Multiple      Live Births  1            Home Medications    Prior to Admission medications   Medication Sig Start Date End Date Taking? Authorizing Provider  amoxicillin (AMOXIL) 875 MG tablet Take 1 tablet (875 mg total) by mouth 2 (two) times daily. 06/08/21  Yes Scot Jun, FNP  albuterol (VENTOLIN HFA) 108 (90 Base) MCG/ACT inhaler Inhale 1 puff into the lungs every 6 (six) hours as needed. 09/13/19   [provider]  medroxyPROGESTERone Acetate 150 MG/ML SUSY Inject 1 mL (150 mg total) into the muscle once for 1 dose. 06/02/21 0000000  Copland, Deirdre Evener, PA-C  valACYclovir (VALTREX) 500 MG tablet Take 1 tablet (500 mg total) by mouth daily. 0000000   Copland, Deirdre Evener, PA-C    Family History Family History  Problem Relation Age of Onset   Hypertension Mother    Heart disease Maternal Grandfather     Social History Social History   Tobacco Use   Smoking status: Former    Types: Cigarettes   Smokeless tobacco: Never  Scientific laboratory technician  Use: Every day   Substances: Nicotine  Substance Use Topics   Alcohol use: No   Drug use: Not Currently    Frequency: 7.0 times per week     Allergies   Patient has no known allergies.  Review of Systems Review of Systems Pertinent negatives listed in HPI   Physical Exam Triage Vital Signs ED Triage Vitals  Enc Vitals Group     BP 06/08/21 1107 123/77     Pulse Rate 06/08/21 1107 92     Resp 06/08/21 1107 16     Temp 06/08/21 1107 98.6 F (37 C)     Temp Source 06/08/21 1107 Temporal     SpO2 06/08/21 1107 95 %     Weight --      Height --      Head Circumference --      Peak Flow --      Pain Score 06/08/21 1108 4     Pain Loc --      Pain Edu? --      Excl. in [place]? --    No data found.  Updated Vital Signs BP 123/77 (BP Location: Left Arm)    Pulse 92    Temp  98.6 F (37 C) (Temporal)    Resp 16    SpO2 95%   Visual Acuity Right Eye Distance:   Left Eye Distance:   Bilateral Distance:    Right Eye Near:   Left Eye Near:    Bilateral Near:     Physical Exam General Appearance:    Alert, acutely ill appearing, cooperative  HENT:   Normocephalic, bilateral TM normal without fluid or infection, neck has both sides anterior cervical nodes enlarged, pharynx erythematous with scant exudate, and nasal mucosa congested  Eyes:    PERRL, conjunctiva/corneas clear, EOM's intact       Lungs:     Clear to auscultation bilaterally, respirations unlabored  Heart:    Regular rate and rhythm  Neurologic:   Awake, alert, oriented x 3. No apparent focal neurological           defect.         UC Treatments / Results  Labs (all labs ordered are listed, but only abnormal results are displayed) Labs Reviewed  POCT RAPID STREP A (OFFICE)  POC INFLUENZA A AND B ANTIGEN (URGENT CARE ONLY)    EKG   Radiology No results found.  Procedures Procedures (including critical care time)  Medications Ordered in UC Medications - No data to display  Initial Impression / Assessment and Plan / UC Course  I have reviewed the triage vital signs and the nursing notes.  Pertinent labs & imaging results that were available during my care of the patient were reviewed by me and considered in my medical decision making (see chart for details).   Acute Pharyngitis Rapid strep negative,however treating for suspected strep based on appearance of throat and symptoms. Continue Ibuprofen for fever and or pain. Force fluids RTC PRN Final Clinical Impressions(s) / UC Diagnoses   Final diagnoses:  Acute pharyngitis, unspecified etiology   Discharge Instructions   None    ED Prescriptions     Medication Sig Dispense Auth. Provider   amoxicillin (AMOXIL) 875 MG tablet Take 1 tablet (875 mg total) by mouth 2 (two) times daily. 20 tablet Scot Jun, FNP       PDMP not reviewed this encounter.   Scot Jun, FNP 06/08/21 1146

## 2021-06-19 ENCOUNTER — Ambulatory Visit (INDEPENDENT_AMBULATORY_CARE_PROVIDER_SITE_OTHER): Payer: Medicaid Other

## 2021-06-19 ENCOUNTER — Other Ambulatory Visit: Payer: Self-pay

## 2021-06-19 DIAGNOSIS — Z3042 Encounter for surveillance of injectable contraceptive: Secondary | ICD-10-CM | POA: Diagnosis not present

## 2021-06-19 MED ORDER — MEDROXYPROGESTERONE ACETATE 150 MG/ML IM SUSP
150.0000 mg | Freq: Once | INTRAMUSCULAR | Status: AC
Start: 2021-06-19 — End: 2021-06-19
  Administered 2021-06-19: 150 mg via INTRAMUSCULAR

## 2021-09-03 DIAGNOSIS — K5900 Constipation, unspecified: Secondary | ICD-10-CM | POA: Insufficient documentation

## 2021-09-03 DIAGNOSIS — R636 Underweight: Secondary | ICD-10-CM | POA: Diagnosis not present

## 2021-09-03 DIAGNOSIS — F419 Anxiety disorder, unspecified: Secondary | ICD-10-CM | POA: Diagnosis not present

## 2021-09-03 DIAGNOSIS — F322 Major depressive disorder, single episode, severe without psychotic features: Secondary | ICD-10-CM | POA: Diagnosis not present

## 2021-09-03 HISTORY — DX: Constipation, unspecified: K59.00

## 2021-10-17 ENCOUNTER — Emergency Department: Payer: Medicaid Other

## 2021-10-17 ENCOUNTER — Other Ambulatory Visit: Payer: Self-pay

## 2021-10-17 ENCOUNTER — Emergency Department
Admission: EM | Admit: 2021-10-17 | Discharge: 2021-10-17 | Disposition: A | Discharge status: Home / Self Care | Payer: Medicaid Other | Attending: Emergency Medicine | Admitting: Emergency Medicine

## 2021-10-17 DIAGNOSIS — E876 Hypokalemia: Secondary | ICD-10-CM | POA: Diagnosis not present

## 2021-10-17 DIAGNOSIS — R079 Chest pain, unspecified: Secondary | ICD-10-CM | POA: Diagnosis not present

## 2021-10-17 DIAGNOSIS — R0789 Other chest pain: Secondary | ICD-10-CM | POA: Insufficient documentation

## 2021-10-17 DIAGNOSIS — G8929 Other chronic pain: Secondary | ICD-10-CM | POA: Diagnosis not present

## 2021-10-17 LAB — CBC
HCT: 40.3 % (ref 36.0–46.0)
Hemoglobin: 12.8 g/dL (ref 12.0–15.0)
MCH: 26.9 pg (ref 26.0–34.0)
MCHC: 31.8 g/dL (ref 30.0–36.0)
MCV: 84.8 fL (ref 80.0–100.0)
Platelets: 263 10*3/uL (ref 150–400)
RBC: 4.75 MIL/uL (ref 3.87–5.11)
RDW: 12.9 % (ref 11.5–15.5)
WBC: 7.3 10*3/uL (ref 4.0–10.5)
nRBC: 0 % (ref 0.0–0.2)

## 2021-10-17 LAB — TROPONIN I (HIGH SENSITIVITY)
Troponin I (High Sensitivity): <2 ng/L (ref <18)
Troponin I (High Sensitivity): <2 ng/L (ref <18)

## 2021-10-17 LAB — BASIC METABOLIC PANEL
Anion gap: 8 (ref 5–15)
BUN: 13 mg/dL (ref 6–20)
CO2: 22 mmol/L (ref 22–32)
Calcium: 8.7 mg/dL — ABNORMAL LOW (ref 8.9–10.3)
Chloride: 105 mmol/L (ref 98–111)
Creatinine, Ser: 0.84 mg/dL (ref 0.44–1.00)
GFR, Estimated: >60 mL/min (ref >60)
Glucose, Bld: 108 mg/dL — ABNORMAL HIGH (ref 70–99)
Potassium: 3.2 mmol/L — ABNORMAL LOW (ref 3.5–5.1)
Sodium: 135 mmol/L (ref 135–145)

## 2021-10-17 LAB — POC URINE PREG, ED: Preg Test, Ur: NEGATIVE

## 2021-10-17 LAB — D-DIMER, QUANTITATIVE: D-Dimer, Quant: 0.27 ug/mL-FEU (ref 0.00–0.50)

## 2021-10-17 MED ORDER — POTASSIUM CHLORIDE CRYS ER 20 MEQ PO TBCR
40.0000 meq | EXTENDED_RELEASE_TABLET | Freq: Once | ORAL | Status: AC
  Administered 2021-10-17: 40 meq via ORAL
  Filled 2021-10-17: qty 2

## 2021-10-17 MED ORDER — KETOROLAC TROMETHAMINE 30 MG/ML IJ SOLN
15.0000 mg | Freq: Once | INTRAMUSCULAR | Status: AC
  Administered 2021-10-17: 15 mg via INTRAVENOUS
  Filled 2021-10-17: qty 1

## 2021-10-17 NOTE — ED Provider Notes (Signed)
[facility] Provider Note    Event Date/Time   First MD Initiated Contact with Patient 10/17/21 1434     (approximate)   History   Chest Pain   HPI  Dawn Meza is a 26 y.o. female who presents to the ED for evaluation of Chest Pain   I reviewed PCP visit from April.  History of depression and anxiety.  Depo-Provera without estrogen supplementation.  Patient presents to the ED for evaluation of acute on chronic chest pain.  She reports months-years of left-sided sharp chest pain, often worse when she is laying in bed at night and develops palpitations when she is trying to fall asleep.  Reports the pain seemed worse for the past couple days, so she wanted to get checked out.  Also reports she had a longer episode, they are often only lasting a matter of minutes, but it was about 2 hours earlier this morning.  It is since resolved and she is feeling better now.   Physical Exam   Triage Vital Signs: ED Triage Vitals  Enc Vitals Group     BP 10/17/21 1407 124/81     Pulse Rate 10/17/21 1407 96     Resp 10/17/21 1407 16     Temp 10/17/21 1407 98.1 F (36.7 C)     Temp Source 10/17/21 1407 Oral     SpO2 10/17/21 1407 100 %     Weight 10/17/21 1408 84 lb (38.1 kg)     Height 10/17/21 1408 5' (1.524 m)     Head Circumference --      Peak Flow --      Pain Score 10/17/21 1407 6     Pain Loc --      Pain Edu? --      Excl. in [place]? --     Most recent vital signs: Vitals:   10/17/21 1407 10/17/21 1647  BP: 124/81 140/75  Pulse: 96 75  Resp: 16 16  Temp: 98.1 F (36.7 C) 98 F (36.7 C)  SpO2: 100% 100%    General: Awake, no distress.  CV:  Good peripheral perfusion. RRR.  No murmurs appreciated Resp:  Normal effort.  Abd:  No distention.  MSK:  No deformity noted.  Neuro:  No focal deficits appreciated. Cranial nerves II through XII intact 5/5 strength and sensation in all 4 extremities Other:  Reproducible chest pain on palpation.  No  overlying skin changes or signs of trauma.   ED Results / Procedures / Treatments   Labs (all labs ordered are listed, but only abnormal results are displayed) Labs Reviewed  BASIC METABOLIC PANEL - Abnormal; Notable for the following components:      Result Value   Potassium 3.2 (*)    Glucose, Bld 108 (*)    Calcium 8.7 (*)    All other components within normal limits  CBC  D-DIMER, QUANTITATIVE  POC URINE PREG, ED  TROPONIN I (HIGH SENSITIVITY)  TROPONIN I (HIGH SENSITIVITY)    EKG Sinus rhythm with a rate of 100 bpm.  Rightward axis and short PR interval.  Otherwise normal intervals.  Nonspecific ST changes inferiorly with mild J-point depressions and T wave inversions.  No comparison.  RADIOLOGY CXR interpreted by me without evidence of acute cardiopulmonary pathology.  Official radiology report(s): DG Chest 2 View  Result Date: 10/17/2021 CLINICAL DATA:  Intermittent chest pain x2 months EXAM: CHEST - 2 VIEW COMPARISON:  11/27/2015 FINDINGS: Cardiac size is within normal limits. There  are no signs of pulmonary edema or focal pulmonary consolidation. Low position of diaphragms most likely is a normal variation due to patient's body habitus. There is no pleural effusion or pneumothorax. IMPRESSION: No active cardiopulmonary disease. Electronically Signed   By: Ernie Avena M.D.   On: 10/17/2021 14:36    PROCEDURES and INTERVENTIONS:  .1-3 Lead EKG Interpretation Performed by: Delton Prairie, MD Authorized by: Delton Prairie, MD     Interpretation: normal     ECG rate:  70   ECG rate assessment: normal     Rhythm: sinus rhythm     Ectopy: none     Conduction: normal    Medications  potassium chloride SA (KLOR-CON M) CR tablet 40 mEq (40 mEq Oral Given 10/17/21 1556)  ketorolac (TORADOL) 30 MG/ML injection 15 mg (15 mg Intravenous Given 10/17/21 1556)     IMPRESSION / MDM / ASSESSMENT AND PLAN / ED COURSE  I reviewed the triage vital signs and the nursing  notes.  Differential diagnosis includes, but is not limited to, ACS, PTX, PNA, muscle strain/spasm, PE, dissection, anxiety  {Patient presents with symptoms of an acute illness or injury that is potentially life-threatening.  26 year old female presents to the ED with atypical chest pain, likely MSK in etiology and suitable for outpatient management.  Her pain is reproducible on exam and she otherwise looks well.  EKG demonstrates nonspecific ST changes to inferior leads and there is no comparison.  Her symptoms are atypical.  2 high-sensitivity troponins are undetectable/negative and her D-dimer is negative making PE very unlikely.  Hypokalemia is noted and repleted orally, as well as a normal CBC.  I considered observation admission for this patient in the setting of her pain and EKG changes, but ultimately I am uncertain if these are acute or relevant today.  Considering she is otherwise young and fairly healthy, we decided upon outpatient management with close cardiology follow-up.  We discussed return precautions.      FINAL CLINICAL IMPRESSION(S) / ED DIAGNOSES   Final diagnoses:  Other chest pain  Hypokalemia     Rx / DC Orders   ED Discharge Orders     None        Note:  This document was prepared using Dragon voice recognition software and may include unintentional dictation errors.   Delton Prairie, MD 10/17/21 [phone]

## 2021-10-17 NOTE — ED Triage Notes (Signed)
Pt to ED via POV stating that she is having sharp pain when she takes a deep breath for several months. Pt states that she has seen her PCP for and she told her that it could be "some kind of pain females get". Today pain has been present for about 1.5 hours. Pt is in NAD.

## 2021-10-30 DIAGNOSIS — R9431 Abnormal electrocardiogram [ECG] [EKG]: Secondary | ICD-10-CM | POA: Diagnosis not present

## 2021-11-07 DIAGNOSIS — R9431 Abnormal electrocardiogram [ECG] [EKG]: Secondary | ICD-10-CM | POA: Diagnosis not present

## 2021-11-07 DIAGNOSIS — R0602 Shortness of breath: Secondary | ICD-10-CM | POA: Diagnosis not present

## 2022-02-13 DIAGNOSIS — R002 Palpitations: Secondary | ICD-10-CM | POA: Diagnosis not present

## 2022-02-13 DIAGNOSIS — R079 Chest pain, unspecified: Secondary | ICD-10-CM | POA: Diagnosis not present

## 2022-02-13 DIAGNOSIS — R0602 Shortness of breath: Secondary | ICD-10-CM | POA: Diagnosis not present

## 2022-02-13 DIAGNOSIS — R0789 Other chest pain: Secondary | ICD-10-CM | POA: Diagnosis not present

## 2022-02-15 DIAGNOSIS — R0789 Other chest pain: Secondary | ICD-10-CM | POA: Diagnosis not present

## 2022-02-19 DIAGNOSIS — I471 Supraventricular tachycardia, unspecified: Secondary | ICD-10-CM | POA: Diagnosis not present

## 2022-02-19 DIAGNOSIS — N83209 Unspecified ovarian cyst, unspecified side: Secondary | ICD-10-CM | POA: Diagnosis not present

## 2022-02-19 DIAGNOSIS — R002 Palpitations: Secondary | ICD-10-CM | POA: Diagnosis not present

## 2022-02-19 DIAGNOSIS — I341 Nonrheumatic mitral (valve) prolapse: Secondary | ICD-10-CM | POA: Diagnosis not present

## 2022-02-19 DIAGNOSIS — I951 Orthostatic hypotension: Secondary | ICD-10-CM | POA: Diagnosis not present

## 2022-02-19 DIAGNOSIS — Z87891 Personal history of nicotine dependence: Secondary | ICD-10-CM | POA: Diagnosis not present

## 2022-02-19 DIAGNOSIS — G90A Postural orthostatic tachycardia syndrome (POTS): Secondary | ICD-10-CM | POA: Diagnosis not present

## 2022-02-19 DIAGNOSIS — J45909 Unspecified asthma, uncomplicated: Secondary | ICD-10-CM | POA: Diagnosis not present

## 2022-03-23 ENCOUNTER — Other Ambulatory Visit (HOSPITAL_COMMUNITY)
Admission: RE | Admit: 2022-03-23 | Discharge: 2022-03-23 | Disposition: A | Discharge status: Home / Self Care | Payer: Medicaid Other | Source: Ambulatory Visit | Attending: Obstetrics & Gynecology | Admitting: Obstetrics & Gynecology

## 2022-03-23 ENCOUNTER — Encounter: Payer: Self-pay | Admitting: Obstetrics & Gynecology

## 2022-03-23 ENCOUNTER — Ambulatory Visit: Payer: Medicaid Other | Admitting: Obstetrics & Gynecology

## 2022-03-23 VITALS — BP 102/67 | HR 93 | Ht 60.0 in | Wt 86.0 lb

## 2022-03-23 DIAGNOSIS — R102 Pelvic and perineal pain: Secondary | ICD-10-CM

## 2022-03-23 NOTE — Progress Notes (Signed)
   Established Patient Office Visit  Subjective   Patient ID: Dawn Meza, female    DOB: [birth year]  Age: 26 y.o. MRN: [id number]  Chief Complaint  Patient presents with   Pelvic Pain    Pt having severe pelvic pain, but not having cycles. Stopped Depo in 06/2021.     HPI   26 yo single P49 (38 yo daughter) is here with severe cramping and oligomenorrhea. She has used depo provera for several years since she cannot take OCPs due to migraines with aura. She stopped depo 06/2021 since she was having constant bleeding. She has had 2 normal periods since stopping depo. She is experiencing severe cramping (says that it is worse than having labor pains). She has tried IBU 400 mg prn with little relief. She uses a hot pack but still no relief. She wonders if this may be endometriosis.  This pain started prior to her pregnancy, and has been worse recently.  She has been abstinent since August. (She used withdrawal and Plan B then).  Objective:     BP 102/67   Pulse 93   Ht 5' (1.524 m)   Wt 86 lb (39 kg)   LMP 12/29/2021 (Approximate)   BMI 16.80 kg/m    Physical Exam   No results found for any visits on 03/23/22.    The ASCVD Risk score (Arnett DK, et al., 2019) failed to calculate for the following reasons:   The 2019 ASCVD risk score is only valid for ages 64 to 77    Well nourished, well hydrated White female, no apparent distress She is ambulating and conversing normally. She jumped multiple times with any genital touching. She couldn't tolerate the spec so I just used a swab for testing. She was jumpy even with the Q tip. Bimanual exam revealed a tiny, mobile uterus and normal, non-tender adnexa (She tolerated this exam well).  Assessment & Plan:   Problem List Items Addressed This Visit   None  Severe cramping/pelvic pain She declines a prescription for IBU but is now aware that she can increase the dose to 600-800 mg q 8 hours prn. She declines restarting depo provera  at this time.  We also discussed Mirena as a potential help for her pelvic pain. I have ordered a pelvic ultrasound, but explained to her that I suspect that it will be normal. Cervical cultures sent. She will come back in a few months or prn sooner   Emily Filbert, MD

## 2022-03-25 LAB — CERVICOVAGINAL ANCILLARY ONLY
Bacterial Vaginitis (gardnerella): NEGATIVE
Candida Glabrata: NEGATIVE
Candida Vaginitis: NEGATIVE
Chlamydia: NEGATIVE
Comment: NEGATIVE
Comment: NEGATIVE
Comment: NEGATIVE
Comment: NEGATIVE
Comment: NEGATIVE
Comment: NORMAL
Neisseria Gonorrhea: NEGATIVE
Trichomonas: NEGATIVE

## 2022-04-12 DIAGNOSIS — Z20822 Contact with and (suspected) exposure to covid-19: Secondary | ICD-10-CM | POA: Diagnosis not present

## 2022-04-12 DIAGNOSIS — R0602 Shortness of breath: Secondary | ICD-10-CM | POA: Diagnosis not present

## 2022-04-12 DIAGNOSIS — R079 Chest pain, unspecified: Secondary | ICD-10-CM | POA: Diagnosis not present

## 2022-04-22 NOTE — Progress Notes (Deleted)
PCP:  Pcp, No   No chief complaint on file.     HPI:      [name] is a 26 y.o. G1P0 whose LMP was No LMP recorded. Patient has had an injection., presents today for her annual examination.  Her menses are regular every 28-30 days, lasting 7-8 days, heavy flow, changing products Q30-40 min for 2 heavy days, with clots.  Dysmenorrhea mod to severe, sometimes missing activities. She does not have intermenstrual bleeding. Started dedpo last yr; hx of menometrorrhagia  Sex activity: single partner, contraception - none. Does NFP and Plan B prn. No pain/bleeding. Would like to start [facility] for contraception and cycles. No hx of HTN, DVTs, but does have a hx of migraines with aura. Got pregnant on OCPs in past. Interested in depo or nexplanon. Did depo in past without side effects.  Last Pap: 09/25/20  Results were: no abnormalities  Hx of STDs: HSV  There is no FH of breast cancer. There is no FH of ovarian cancer. The patient does not do self-breast exams.  Tobacco use: vapes daily  Alcohol use: none No drug use.  Exercise: moderately active  She does get adequate calcium but not Vitamin D in her diet.  Pt seeing GI for wt loss even with increased caloric intake.  Past Medical History:  Diagnosis Date   Anxiety    Asthma    Depression    Dysmenorrhea    Migraine with aura    Ovarian cyst    Suicide attempt [facility])     Past Surgical History:  Procedure Laterality Date   APPENDECTOMY     ESOPHAGOGASTRODUODENOSCOPY (EGD) WITH PROPOFOL N/A 07/26/2020   Procedure: ESOPHAGOGASTRODUODENOSCOPY (EGD) WITH PROPOFOL;  Surgeon: Pasty Spillers, MD;  Location: [facility] ENDOSCOPY;  Service: Endoscopy;  Laterality: N/A;    Family History  Problem Relation Age of Onset   Hypertension Mother    Heart disease Maternal Grandfather     Social History   Socioeconomic History   Marital status: Single    Spouse name: Not on file   Number of children: Not on file   Years of education:  Not on file   Highest education level: Not on file  Occupational History   Not on file  Tobacco Use   Smoking status: Former    Types: Cigarettes   Smokeless tobacco: Never  Vaping Use   Vaping Use: Every day   Substances: Nicotine  Substance and Sexual Activity   Alcohol use: No   Drug use: Not Currently    Frequency: 7.0 times per week   Sexual activity: Not Currently    Partners: Male    Birth control/protection: Injection  Other Topics Concern   Not on file  Social History Narrative   Not on file   Social Determinants of Health   Financial Resource Strain: Not on file  Food Insecurity: Not on file  Transportation Needs: Not on file  Physical Activity: Not on file  Stress: Not on file  Social Connections: Not on file  Intimate Partner Violence: Not on file     Current Outpatient Medications:    albuterol (VENTOLIN HFA) 108 (90 Base) MCG/ACT inhaler, Inhale 1 puff into the lungs every 6 (six) hours as needed., Disp: , Rfl:    medroxyPROGESTERone Acetate 150 MG/ML SUSY, Inject 1 mL (150 mg total) into the muscle once for 1 dose., Disp: 1 mL, Rfl: 3   valACYclovir (VALTREX) 500 MG tablet, Take 1 tablet (500 mg  total) by mouth daily., Disp: 30 tablet, Rfl: 0     ROS:  Review of Systems  Constitutional:  Positive for unexpected weight change. Negative for fatigue and fever.  Respiratory:  Negative for cough, shortness of breath and wheezing.   Cardiovascular:  Negative for chest pain, palpitations and leg swelling.  Gastrointestinal:  Negative for blood in stool, constipation, diarrhea, nausea and vomiting.  Endocrine: Negative for cold intolerance, heat intolerance and polyuria.  Genitourinary:  Negative for dyspareunia, dysuria, flank pain, frequency, genital sores, hematuria, menstrual problem, pelvic pain, urgency, vaginal bleeding, vaginal discharge and vaginal pain.  Musculoskeletal:  Negative for back pain, joint swelling and myalgias.  Skin:  Negative for  rash.  Neurological:  Negative for dizziness, syncope, light-headedness, numbness and headaches.  Hematological:  Negative for adenopathy.  Psychiatric/Behavioral:  Negative for agitation, confusion, sleep disturbance and suicidal ideas. The patient is not nervous/anxious.    BREAST: No symptoms   Objective: There were no vitals taken for this visit.   Physical Exam Constitutional:      Appearance: She is well-developed.  Genitourinary:     Vulva normal.     Right Labia: No rash, tenderness or lesions.    Left Labia: No tenderness, lesions or rash.    No vaginal discharge, erythema or tenderness.      Right Adnexa: not tender and no mass present.    Left Adnexa: not tender and no mass present.    No cervical friability or polyp.     Uterus is not enlarged or tender.  Breasts:    Right: No mass, nipple discharge, skin change or tenderness.     Left: No mass, nipple discharge, skin change or tenderness.  Neck:     Thyroid: No thyromegaly.  Cardiovascular:     Rate and Rhythm: Normal rate and regular rhythm.     Heart sounds: Normal heart sounds. No murmur heard. Pulmonary:     Effort: Pulmonary effort is normal.     Breath sounds: Normal breath sounds.  Abdominal:     Palpations: Abdomen is soft.     Tenderness: There is no abdominal tenderness. There is no guarding or rebound.  Musculoskeletal:        General: Normal range of motion.     Cervical back: Normal range of motion.  Lymphadenopathy:     Cervical: No cervical adenopathy.  Neurological:     General: No focal deficit present.     Mental Status: She is alert and oriented to person, place, and time.     Cranial Nerves: No cranial nerve deficit.  Skin:    General: Skin is warm and dry.  Psychiatric:        Mood and Affect: Mood normal.        Behavior: Behavior normal.        Thought Content: Thought content normal.        Judgment: Judgment normal.  Vitals reviewed.     Assessment/Plan: Encounter for  annual routine gynecological examination  Cervical cancer screening - Plan: Cytology - PAP  Screening for STD (sexually transmitted disease) - Plan: Cytology - PAP  Encounter for initial prescription of injectable contraceptive - Plan: medroxyPROGESTERone Acetate 150 MG/ML SUSY; depo discussed, Rx eRxd. RTO with next menses for injection. Condoms for 1 wk. Cont ca/Vit D. F/u prn.  Menometrorrhagia--try depo. F/u prn   No orders of the defined types were placed in this encounter.            GYN  counsel family planning choices, adequate intake of calcium and vitamin D, diet and exercise     F/U  No follow-ups on file.  Navah Grondin B. Vernice Bowker, PA-C 04/22/2022 8:46 PM

## 2022-04-23 ENCOUNTER — Ambulatory Visit: Payer: Medicaid Other | Admitting: Obstetrics and Gynecology

## 2022-04-23 DIAGNOSIS — B009 Herpesviral infection, unspecified: Secondary | ICD-10-CM

## 2022-04-23 DIAGNOSIS — N921 Excessive and frequent menstruation with irregular cycle: Secondary | ICD-10-CM

## 2022-04-23 DIAGNOSIS — Z01419 Encounter for gynecological examination (general) (routine) without abnormal findings: Secondary | ICD-10-CM

## 2022-04-23 DIAGNOSIS — Z3042 Encounter for surveillance of injectable contraceptive: Secondary | ICD-10-CM

## 2022-06-29 ENCOUNTER — Other Ambulatory Visit: Payer: Self-pay | Admitting: Obstetrics and Gynecology

## 2022-06-29 DIAGNOSIS — Z30013 Encounter for initial prescription of injectable contraceptive: Secondary | ICD-10-CM

## 2022-08-07 ENCOUNTER — Ambulatory Visit (INDEPENDENT_AMBULATORY_CARE_PROVIDER_SITE_OTHER): Payer: Medicaid Other

## 2022-08-07 VITALS — BP 100/60 | Ht 60.0 in | Wt 86.0 lb

## 2022-08-07 DIAGNOSIS — R300 Dysuria: Secondary | ICD-10-CM | POA: Diagnosis not present

## 2022-08-07 LAB — POCT URINALYSIS DIPSTICK
Bilirubin, UA: NEGATIVE
Blood, UA: NEGATIVE
Glucose, UA: NEGATIVE
Ketones, UA: NEGATIVE
Leukocytes, UA: NEGATIVE
Nitrite, UA: NEGATIVE
Protein, UA: NEGATIVE
Spec Grav, UA: 1.01 (ref 1.010–1.025)
Urobilinogen, UA: 1 E.U./dL
pH, UA: 5.5 (ref 5.0–8.0)

## 2022-08-07 NOTE — Patient Instructions (Signed)
Urinary Tract Infection, Adult A urinary tract infection (UTI) is an infection of any part of the urinary tract. The urinary tract includes: The kidneys. The ureters. The bladder. The urethra. These organs make, store, and get rid of pee (urine) in the body. What are the causes? This infection is caused by germs (bacteria) in your genital area. These germs grow and cause swelling (inflammation) of your urinary tract. What increases the risk? The following factors may make you more likely to develop this condition: Using a small, thin tube (catheter) to drain pee. Not being able to control when you pee or poop (incontinence). Being female. If you are female, these things can increase the risk: Using these methods to prevent pregnancy: A medicine that kills sperm (spermicide). A device that blocks sperm (diaphragm). Having low levels of a female hormone (estrogen). Being pregnant. You are more likely to develop this condition if: You have genes that add to your risk. You are sexually active. You take antibiotic medicines. You have trouble peeing because of: A prostate that is bigger than normal, if you are female. A blockage in the part of your body that drains pee from the bladder. A kidney stone. A nerve condition that affects your bladder. Not getting enough to drink. Not peeing often enough. You have other conditions, such as: Diabetes. A weak disease-fighting system (immune system). Sickle cell disease. Gout. Injury of the spine. What are the signs or symptoms? Symptoms of this condition include: Needing to pee right away. Peeing small amounts often. Pain or burning when peeing. Blood in the pee. Pee that smells bad or not like normal. Trouble peeing. Pee that is cloudy. Fluid coming from the vagina, if you are female. Pain in the belly or lower back. Other symptoms include: Vomiting. Not feeling hungry. Feeling mixed up (confused). This may be the first symptom in  older adults. Being tired and grouchy (irritable). A fever. Watery poop (diarrhea). How is this treated? Taking antibiotic medicine. Taking other medicines. Drinking enough water. In some cases, you may need to see a specialist. Follow these instructions at home:  Medicines Take over-the-counter and prescription medicines only as told by your doctor. If you were prescribed an antibiotic medicine, take it as told by your doctor. Do not stop taking it even if you start to feel better. General instructions Make sure you: Pee until your bladder is empty. Do not hold pee for a long time. Empty your bladder after sex. Wipe from front to back after peeing or pooping if you are a female. Use each tissue one time when you wipe. Drink enough fluid to keep your pee pale yellow. Keep all follow-up visits. Contact a doctor if: You do not get better after 1-2 days. Your symptoms go away and then come back. Get help right away if: You have very bad back pain. You have very bad pain in your lower belly. You have a fever. You have chills. You feeling like you will vomit or you vomit. Summary A urinary tract infection (UTI) is an infection of any part of the urinary tract. This condition is caused by germs in your genital area. There are many risk factors for a UTI. Treatment includes antibiotic medicines. Drink enough fluid to keep your pee pale yellow. This information is not intended to replace advice given to you by your health care provider. Make sure you discuss any questions you have with your health care provider. Document Revised: 12/15/2019 Document Reviewed: 12/15/2019 Elsevier Patient Education    2023 Elsevier Inc.  

## 2022-08-07 NOTE — Progress Notes (Signed)
    NURSE VISIT NOTE  Subjective:    Patient ID: Dawn Meza, female    DOB: 20-May-1995, 27 y.o.   MRN: [id number]       HPI  Patient is a 27 y.o. G1P0 female who presents for dysuria, urinary frequency, and urinary urgency for 5 day.  Patient denies hematuria, flank pain, abdominal pain, pelvic pain, cloudy malordorous urine, genital rash, genital irritation, and vaginal discharge.  Patient does have a history of recurrent UTI.  Patient does not have a history of pyelonephritis.    Objective:    BP 100/60   Ht 5' (1.524 m)   Wt 86 lb (39 kg)   LMP 07/27/2022   BMI 16.80 kg/m    Lab Review  Results for orders placed or performed in visit on 08/07/22  POCT urinalysis dipstick  Result Value Ref Range   Color, UA     Clarity, UA     Glucose, UA Negative Negative   Bilirubin, UA Negative    Ketones, UA Negative    Spec Grav, UA 1.010 1.010 - 1.025   Blood, UA Negative    pH, UA 5.5 5.0 - 8.0   Protein, UA Negative Negative   Urobilinogen, UA 1.0 0.2 or 1.0 E.U./dL   Nitrite, UA Negative    Leukocytes, UA Negative Negative   Appearance     Odor      Assessment:   1. Dysuria      Plan:   Urine Culture Sent.   Quintella Baton, CMA

## 2022-08-11 ENCOUNTER — Other Ambulatory Visit: Payer: Self-pay | Admitting: Obstetrics and Gynecology

## 2022-08-11 LAB — URINE CULTURE

## 2022-08-11 MED ORDER — NITROFURANTOIN MONOHYD MACRO 100 MG PO CAPS: 100.0000 mg | ORAL_CAPSULE | Freq: Two times a day (BID) | ORAL | 0 refills | Status: AC

## 2022-08-11 NOTE — Progress Notes (Signed)
Rx macrobid for positive C&S.

## 2022-08-12 ENCOUNTER — Encounter: Payer: Self-pay | Admitting: Obstetrics and Gynecology

## 2022-08-13 MED ORDER — NORETHINDRONE 0.35 MG PO TABS
1.0000 | ORAL_TABLET | Freq: Every day | ORAL | 0 refills | Status: DC
Start: 2022-08-13 — End: 2022-11-25

## 2022-09-08 DIAGNOSIS — N898 Other specified noninflammatory disorders of vagina: Secondary | ICD-10-CM | POA: Diagnosis not present

## 2022-09-08 DIAGNOSIS — R102 Pelvic and perineal pain: Secondary | ICD-10-CM | POA: Diagnosis not present

## 2022-09-08 DIAGNOSIS — N760 Acute vaginitis: Secondary | ICD-10-CM | POA: Diagnosis not present

## 2022-09-11 ENCOUNTER — Ambulatory Visit: Payer: Medicaid Other

## 2022-09-11 DIAGNOSIS — Z113 Encounter for screening for infections with a predominantly sexual mode of transmission: Secondary | ICD-10-CM

## 2022-11-15 ENCOUNTER — Emergency Department
Admission: EM | Admit: 2022-11-15 | Discharge: 2022-11-15 | Disposition: A | Discharge status: Home / Self Care | Payer: Medicaid Other | Attending: Emergency Medicine | Admitting: Emergency Medicine

## 2022-11-15 DIAGNOSIS — R9431 Abnormal electrocardiogram [ECG] [EKG]: Secondary | ICD-10-CM | POA: Diagnosis not present

## 2022-11-15 DIAGNOSIS — D72829 Elevated white blood cell count, unspecified: Secondary | ICD-10-CM | POA: Insufficient documentation

## 2022-11-15 DIAGNOSIS — R112 Nausea with vomiting, unspecified: Secondary | ICD-10-CM | POA: Diagnosis not present

## 2022-11-15 DIAGNOSIS — R519 Headache, unspecified: Secondary | ICD-10-CM | POA: Insufficient documentation

## 2022-11-15 LAB — CBC WITH DIFFERENTIAL/PLATELET
Abs Immature Granulocytes: 0.07 10*3/uL (ref 0.00–0.07)
Basophils Absolute: 0.1 10*3/uL (ref 0.0–0.1)
Basophils Relative: 1 %
Eosinophils Absolute: 0 10*3/uL (ref 0.0–0.5)
Eosinophils Relative: 0 %
HCT: 41.9 % (ref 36.0–46.0)
Hemoglobin: 13.5 g/dL (ref 12.0–15.0)
Immature Granulocytes: 1 %
Lymphocytes Relative: 10 %
Lymphs Abs: 1.4 10*3/uL (ref 0.7–4.0)
MCH: 27.6 pg (ref 26.0–34.0)
MCHC: 32.2 g/dL (ref 30.0–36.0)
MCV: 85.7 fL (ref 80.0–100.0)
Monocytes Absolute: 0.4 10*3/uL (ref 0.1–1.0)
Monocytes Relative: 3 %
Neutro Abs: 11.7 10*3/uL — ABNORMAL HIGH (ref 1.7–7.7)
Neutrophils Relative %: 85 %
Platelets: 322 10*3/uL (ref 150–400)
RBC: 4.89 MIL/uL (ref 3.87–5.11)
RDW: 12.5 % (ref 11.5–15.5)
WBC: 13.7 10*3/uL — ABNORMAL HIGH (ref 4.0–10.5)
nRBC: 0 % (ref 0.0–0.2)

## 2022-11-15 LAB — URINALYSIS, ROUTINE W REFLEX MICROSCOPIC
Bilirubin Urine: NEGATIVE
Glucose, UA: NEGATIVE mg/dL
Hgb urine dipstick: NEGATIVE
Ketones, ur: 20 mg/dL — AB
Leukocytes,Ua: NEGATIVE
Nitrite: NEGATIVE
Protein, ur: NEGATIVE mg/dL
Specific Gravity, Urine: 1.016 (ref 1.005–1.030)
pH: 8 (ref 5.0–8.0)

## 2022-11-15 LAB — COMPREHENSIVE METABOLIC PANEL
ALT: 17 U/L (ref 0–44)
AST: 22 U/L (ref 15–41)
Albumin: 4.6 g/dL (ref 3.5–5.0)
Alkaline Phosphatase: 52 U/L (ref 38–126)
Anion gap: 11 (ref 5–15)
BUN: 15 mg/dL (ref 6–20)
CO2: 21 mmol/L — ABNORMAL LOW (ref 22–32)
Calcium: 9.1 mg/dL (ref 8.9–10.3)
Chloride: 107 mmol/L (ref 98–111)
Creatinine, Ser: 0.76 mg/dL (ref 0.44–1.00)
GFR, Estimated: >60 mL/min (ref >60)
Glucose, Bld: 101 mg/dL — ABNORMAL HIGH (ref 70–99)
Potassium: 3.8 mmol/L (ref 3.5–5.1)
Sodium: 139 mmol/L (ref 135–145)
Total Bilirubin: 0.8 mg/dL (ref 0.3–1.2)
Total Protein: 7.4 g/dL (ref 6.5–8.1)

## 2022-11-15 LAB — LIPASE, BLOOD: Lipase: 41 U/L (ref 11–51)

## 2022-11-15 LAB — PREGNANCY, URINE: Preg Test, Ur: NEGATIVE

## 2022-11-15 LAB — MAGNESIUM: Magnesium: 2.2 mg/dL (ref 1.7–2.4)

## 2022-11-15 MED ORDER — KETOROLAC TROMETHAMINE 15 MG/ML IJ SOLN
15.0000 mg | Freq: Once | INTRAMUSCULAR | Status: AC
  Administered 2022-11-15: 15 mg via INTRAVENOUS
  Filled 2022-11-15: qty 1

## 2022-11-15 MED ORDER — PROCHLORPERAZINE EDISYLATE 10 MG/2ML IJ SOLN
10.0000 mg | Freq: Once | INTRAMUSCULAR | Status: DC
  Filled 2022-11-15: qty 2

## 2022-11-15 MED ORDER — DIPHENHYDRAMINE HCL 50 MG/ML IJ SOLN
25.0000 mg | Freq: Once | INTRAMUSCULAR | Status: AC
  Administered 2022-11-15: 25 mg via INTRAVENOUS
  Filled 2022-11-15: qty 1

## 2022-11-15 MED ORDER — SODIUM CHLORIDE 0.9 % IV BOLUS
1000.0000 mL | Freq: Once | INTRAVENOUS | Status: AC
  Administered 2022-11-15: 1000 mL via INTRAVENOUS

## 2022-11-15 MED ORDER — ONDANSETRON HCL 4 MG/2ML IJ SOLN
4.0000 mg | Freq: Once | INTRAMUSCULAR | Status: AC
  Administered 2022-11-15: 4 mg via INTRAVENOUS
  Filled 2022-11-15: qty 2

## 2022-11-15 MED ORDER — ONDANSETRON HCL 4 MG PO TABS: 4.0000 mg | ORAL_TABLET | Freq: Four times a day (QID) | ORAL | 0 refills | Status: AC | PRN

## 2022-11-15 NOTE — ED Notes (Signed)
Patient remains in a darkened room. NS infusing. Patient states pain has improved.

## 2022-11-15 NOTE — Discharge Instructions (Signed)
You were seen in the emergency department today for evaluation of your headache and vomiting.  I am glad you are feeling better after receiving medication here.  Your testing was reassuring.  I sent a prescription for nausea medicine to your pharmacy that you can take as needed.  Return to the ER for new or worsening symptoms.

## 2022-11-15 NOTE — ED Triage Notes (Signed)
Patient c/o migraine with dizziness, nausea, and vomiting (history of same); Patient states that she went out drinking last night and that set off her migraine

## 2022-11-15 NOTE — ED Notes (Signed)
Patient ambulated from room to hallway bathroom with a steady gait.

## 2022-11-15 NOTE — ED Provider Notes (Signed)
[facility] Provider Note    Event Date/Time   First MD Initiated Contact with Patient 11/15/22 1232     (approximate)   History   Migraine (Patient c/o migraine with dizziness, nausea, and vomiting (history of same); Patient states that she went out drinking last night and that set off her migraine) and Abdominal Pain   HPI  Dawn Meza is a 27 y.o. female with history of migraines presenting to the emergency department for evaluation of headache and vomiting.  This morning, patient had onset of a right-sided headache similar to her prior migraines.  She has associated nausea with multiple episodes of nonbloody vomiting.  Does report she was out drinking last night.  No fevers.  Does report intermittent cramping and pins and needle sensations in her extremities.    Physical Exam   Triage Vital Signs: ED Triage Vitals  Enc Vitals Group     BP 11/15/22 1223 92/62     Pulse Rate 11/15/22 1223 97     Resp 11/15/22 1223 19     Temp 11/15/22 1223 97.8 F (36.6 C)     Temp Source 11/15/22 1223 Oral     SpO2 11/15/22 1223 100 %     Weight 11/15/22 1221 95 lb (43.1 kg)     Height 11/15/22 1221 5' (1.524 m)     Head Circumference --      Peak Flow --      Pain Score 11/15/22 1223 8     Pain Loc --      Pain Edu? --      Excl. in GC? --     Most recent vital signs: Vitals:   11/15/22 1223  BP: 92/62  Pulse: 97  Resp: 19  Temp: 97.8 F (36.6 C)  SpO2: 100%     General: Awake, interactive  CV:  Regular rate, good peripheral perfusion.  Resp:  Lungs clear, unlabored respirations.  Abd:  Soft, nondistended.  Neuro:  Symmetric facial movement, fluid speech, 5 out of 5 strength in the bilateral upper and lower extremities with normal sensation, normal extraocular movements, normal finger-nose testing   ED Results / Procedures / Treatments   Labs (all labs ordered are listed, but only abnormal results are displayed) Labs Reviewed  CBC WITH  DIFFERENTIAL/PLATELET - Abnormal; Notable for the following components:      Result Value   WBC 13.7 (*)    Neutro Abs 11.7 (*)    All other components within normal limits  COMPREHENSIVE METABOLIC PANEL - Abnormal; Notable for the following components:   CO2 21 (*)    Glucose, Bld 101 (*)    All other components within normal limits  URINALYSIS, ROUTINE W REFLEX MICROSCOPIC - Abnormal; Notable for the following components:   Color, Urine YELLOW (*)    APPearance CLEAR (*)    Ketones, ur 20 (*)    All other components within normal limits  PREGNANCY, URINE  LIPASE, BLOOD  MAGNESIUM     EKG EKG independently reviewed interpreted by myself (ER attending) demonstrates:    RADIOLOGY Imaging independently reviewed and interpreted by myself demonstrates:    PROCEDURES:  Critical Care performed: No  Procedures   MEDICATIONS ORDERED IN ED: Medications  prochlorperazine (COMPAZINE) injection 10 mg (10 mg Intravenous Patient Refused/Not Given 11/15/22 1340)  sodium chloride 0.9 % bolus 1,000 mL (1,000 mLs Intravenous New Bag/Given 11/15/22 1340)  ondansetron (ZOFRAN) injection 4 mg (4 mg Intravenous Given 11/15/22 1340)  ketorolac (TORADOL)  15 MG/ML injection 15 mg (15 mg Intravenous Given 11/15/22 1339)  diphenhydrAMINE (BENADRYL) injection 25 mg (25 mg Intravenous Given 11/15/22 1339)     IMPRESSION / MDM / ASSESSMENT AND PLAN / ED COURSE  I reviewed the triage vital signs and the nursing notes.  Differential diagnosis includes, but is not limited to, migraine, anemia, electrolyte abnormality, lower suspicion for severe etiology given absence of sudden onset, difference from prior, or focal neurologic deficits  Patient's presentation is most consistent with acute presentation with potential threat to life or bodily function.  27 year old female presenting with headache similar to prior and vomiting.  Labs ordered from triage.  Will treat symptomatically with IV fluids, Toradol,  Benadryl, Zofran, and Compazine.  On reevaluation, patient reports significant improvement in her symptoms.  Lab work without severe derangement, mild leukocytosis noted, possibly reactive.  Patient was able to tolerate p.o. trial.  She is comfortable with discharge home.  Strict return precautions provided.  Discharged with prescription for Zofran.    FINAL CLINICAL IMPRESSION(S) / ED DIAGNOSES   Final diagnoses:  Acute nonintractable headache, unspecified headache type  Nausea and vomiting, unspecified vomiting type     Rx / DC Orders   ED Discharge Orders          Ordered    ondansetron (ZOFRAN) 4 MG tablet  Every 6 hours PRN        11/15/22 1534             Note:  This document was prepared using Dragon voice recognition software and may include unintentional dictation errors.   Trinna Post, MD 11/15/22 [phone]

## 2022-11-21 DIAGNOSIS — R3915 Urgency of urination: Secondary | ICD-10-CM | POA: Diagnosis not present

## 2022-11-21 DIAGNOSIS — R109 Unspecified abdominal pain: Secondary | ICD-10-CM | POA: Diagnosis not present

## 2022-11-24 ENCOUNTER — Other Ambulatory Visit (HOSPITAL_COMMUNITY)
Admission: RE | Admit: 2022-11-24 | Discharge: 2022-11-24 | Disposition: A | Discharge status: Home / Self Care | Payer: Medicaid Other | Source: Ambulatory Visit | Attending: Obstetrics | Admitting: Obstetrics

## 2022-11-24 ENCOUNTER — Encounter: Payer: Self-pay | Admitting: Obstetrics

## 2022-11-24 ENCOUNTER — Ambulatory Visit (INDEPENDENT_AMBULATORY_CARE_PROVIDER_SITE_OTHER): Payer: Medicaid Other | Admitting: Obstetrics

## 2022-11-24 VITALS — BP 109/69 | HR 92 | Ht 60.0 in | Wt 89.0 lb

## 2022-11-24 DIAGNOSIS — Z01419 Encounter for gynecological examination (general) (routine) without abnormal findings: Secondary | ICD-10-CM | POA: Diagnosis not present

## 2022-11-24 DIAGNOSIS — M25559 Pain in unspecified hip: Secondary | ICD-10-CM

## 2022-11-24 DIAGNOSIS — Z113 Encounter for screening for infections with a predominantly sexual mode of transmission: Secondary | ICD-10-CM

## 2022-11-24 DIAGNOSIS — R300 Dysuria: Secondary | ICD-10-CM | POA: Diagnosis not present

## 2022-11-24 DIAGNOSIS — R110 Nausea: Secondary | ICD-10-CM | POA: Diagnosis not present

## 2022-11-24 DIAGNOSIS — Z0000 Encounter for general adult medical examination without abnormal findings: Secondary | ICD-10-CM

## 2022-11-24 NOTE — Progress Notes (Signed)
ANNUAL GYNECOLOGICAL EXAM  SUBJECTIVE  HPI  Dawn Meza is a 27 y.o.-year-old G1P0 who presents for an annual gynecological exam today.  She reports a missed period with negative UPT, pelvic pressure, and UTI symptoms. She was seen in urgent care on Sunday and treated empirically with antibiotics with a small improvement in symptoms. She also has daily nausea and has had 2 episodes of extended vomiting in the last two months. She is seeing GI for this. She reports frequent migraines. She is being seen by a cardiologist for tachycardia. She has a h/o anxiety and depression but reports she has felt much better since the birth of her daughter. She takes hydroxyzine PRN for anxiety with good results. She has frequent hip pain and would like a referral to chiropractic. She is currently sexually active with one female partner. She desires an IUD but find pelvic exams very difficult to tolerate. Desires STI testing today.  Medical/Surgical History Past Medical History:  Diagnosis Date   Anxiety    Asthma    Depression    Dysmenorrhea    Migraine with aura    Ovarian cyst    Suicide attempt [facility])    Past Surgical History:  Procedure Laterality Date   APPENDECTOMY     ESOPHAGOGASTRODUODENOSCOPY (EGD) WITH PROPOFOL N/A 07/26/2020   Procedure: ESOPHAGOGASTRODUODENOSCOPY (EGD) WITH PROPOFOL;  Surgeon: Pasty Spillers, MD;  Location: [facility] ENDOSCOPY;  Service: Endoscopy;  Laterality: N/A;    Social History Lives with daughter. Feels safe there Work: Not currently employed Exercise: yoga, pilates   Obstetric History OB History     Gravida  1   Para      Term      Preterm      AB      Living  1      SAB      IAB      Ectopic      Multiple      Live Births  1            GYN/Menstrual History LMP 09/25/22 Irregular periods Last Pap: 09/25/2020. NILM. Contraception: POP  Prevention Dentist: regular exams Eye exam: has not been Mammogram: at 40 Colonoscopy:  at 45  Current Medications Outpatient Medications Prior to Visit  Medication Sig   albuterol (VENTOLIN HFA) 108 (90 Base) MCG/ACT inhaler Inhale 1 puff into the lungs every 6 (six) hours as needed.   norethindrone (MICRONOR) 0.35 MG tablet Take 1 tablet (0.35 mg total) by mouth daily.   valACYclovir (VALTREX) 500 MG tablet Take 1 tablet (500 mg total) by mouth daily.   No facility-administered medications prior to visit.      [facility] - 11/24/22 0843       Pregnancy Intention Screening   Does the patient want to become pregnant in the next year? No    Does the patient's partner want to become pregnant in the next year? No    Would the patient like to discuss contraceptive options today? No      Contraception Wrap Up   Current Method Oral Contraceptive    Contraception Counseling Provided No    How was the end contraceptive method provided? N/A             ROS Constitutional: Denied constitutional symptoms, night sweats, recent illness, fatigue, fever, insomnia and weight loss.  Eyes: Denied eye symptoms, eye pain, photophobia, vision change and visual disturbance.  HEENT: Denied ear, nose, throat or neck symptoms, hearing loss, nasal discharge, sinus  congestion and sore throat. +frequent migraines  Cardiovascular: Denied cardiovascular symptoms, arrhythmia, chest pain/pressure, edema, exercise intolerance, orthopnea and palpitations.  Respiratory: Denied pulmonary symptoms, asthma, pleuritic pain, productive sputum, cough, dyspnea and wheezing.  Gastrointestinal: See HPI  Genitourinary: See HPI  Musculoskeletal: Denied musculoskeletal symptoms, stiffness, swelling, muscle weakness and myalgia. +hip pain  Dermatologic: Denied dermatology symptoms, rash and scar.  Neurologic: Denied neurology symptoms, dizziness, headache, neck pain and syncope.  Psychiatric: Denied psychiatric symptoms, anxiety and depression.  Endocrine: Denied endocrine symptoms including hot flashes and  night sweats.    OBJECTIVE  BP 109/69   Pulse 92   Ht 5' (1.524 m)   Wt 89 lb (40.4 kg)   LMP  (LMP Unknown)   BMI 17.38 kg/m    Physical examination General NAD, Conversant  HEENT Atraumatic; Op clear with mmm.  Normo-cephalic. Pupils reactive. Anicteric sclerae  Thyroid/Neck Smooth without nodularity or enlargement. Normal ROM.  Neck Supple.  Skin No rashes, lesions or ulceration. Normal palpated skin turgor. No nodularity.  Breasts: No masses or discharge.  Symmetric.  No axillary adenopathy.  Lungs: Clear to auscultation.No rales or wheezes. Normal Respiratory effort, no retractions.  Heart: NSR.  No murmurs or rubs appreciated. No peripheral edema  Abdomen: Soft.  Non-tender.  No masses.  No HSM. No hernia  Extremities: Moves all appropriately.  Normal ROM for age. No lymphadenopathy.  Neuro: Oriented to PPT.  Normal mood. Normal affect.     Pelvic: Declined    ASSESSMENT  1) Annual exam 2) Desires IUD 3) Missed period 4) Hip pain 5) UTI symptoms  PLAN 1) Physical exam as noted. Discussed healthy lifestyle choices and preventive care. STI swabs and blood work collected. Declines routine labs. 2) Will schedule consult with MD for IUD placement under sedation 3) bhCG collected. F/u based on results 4) Discussed stretches/yoga. Referral to chiro placed. 5) Urine C&S collected. Continue Macrobid while awaiting results.  Return in one year for annual exam or as needed for concerns.   [name], CNM

## 2022-11-25 ENCOUNTER — Other Ambulatory Visit: Payer: Self-pay | Admitting: Obstetrics and Gynecology

## 2022-11-25 LAB — RPR: RPR Ser Ql: NONREACTIVE

## 2022-11-25 LAB — HIV ANTIBODY (ROUTINE TESTING W REFLEX): HIV Screen 4th Generation wRfx: NONREACTIVE

## 2022-11-25 LAB — HEPATITIS C ANTIBODY: Hep C Virus Ab: NONREACTIVE

## 2022-11-25 LAB — HEPATITIS B SURFACE ANTIGEN: Hepatitis B Surface Ag: NEGATIVE

## 2022-11-25 LAB — HEPATITIS B SURFACE ANTIBODY,QUALITATIVE: Hep B Surface Ab, Qual: REACTIVE

## 2022-11-25 MED ORDER — NORETHINDRONE 0.35 MG PO TABS: 1.0000 | ORAL_TABLET | Freq: Every day | ORAL | 0 refills | Status: DC

## 2022-11-26 LAB — CERVICOVAGINAL ANCILLARY ONLY
Bacterial Vaginitis (gardnerella): NEGATIVE
Candida Glabrata: NEGATIVE
Candida Vaginitis: NEGATIVE
Chlamydia: NEGATIVE
Comment: NEGATIVE
Comment: NEGATIVE
Comment: NEGATIVE
Comment: NEGATIVE
Comment: NEGATIVE
Comment: NORMAL
Neisseria Gonorrhea: NEGATIVE
Trichomonas: NEGATIVE

## 2022-11-26 LAB — URINE CULTURE

## 2022-11-30 ENCOUNTER — Telehealth: Payer: Self-pay

## 2022-11-30 ENCOUNTER — Other Ambulatory Visit: Payer: Self-pay

## 2022-11-30 DIAGNOSIS — N912 Amenorrhea, unspecified: Secondary | ICD-10-CM

## 2022-11-30 NOTE — Telephone Encounter (Signed)
Pt came in on 11/24/22 and had labs done, including a beta test. She hasn't heard back regarding this test. Checked with Andrey Campanile since she drew labs and I was advised the beta ordered was through [facility] and that's why it wasn't collected. Correct Beta ordered and pt should get results tomorrow. She does not need to come in to get redrawn.

## 2022-12-04 ENCOUNTER — Other Ambulatory Visit: Payer: Medicaid Other

## 2022-12-05 LAB — BETA HCG QUANT (REF LAB): hCG Quant: <1 m[IU]/mL

## 2022-12-07 ENCOUNTER — Encounter: Payer: Self-pay | Admitting: Obstetrics

## 2022-12-15 ENCOUNTER — Encounter: Payer: Self-pay | Admitting: Obstetrics and Gynecology

## 2022-12-15 ENCOUNTER — Ambulatory Visit: Payer: Medicaid Other | Admitting: Obstetrics and Gynecology

## 2022-12-15 VITALS — BP 98/62 | HR 84 | Resp 16 | Ht 60.0 in | Wt 90.4 lb

## 2022-12-15 DIAGNOSIS — Z3009 Encounter for other general counseling and advice on contraception: Secondary | ICD-10-CM | POA: Diagnosis not present

## 2022-12-15 MED ORDER — ALPRAZOLAM 0.5 MG PO TABS: 0.5000 mg | ORAL_TABLET | Freq: Once | ORAL | 0 refills | Status: DC | PRN

## 2022-12-15 NOTE — Progress Notes (Signed)
    GYNECOLOGY PROGRESS NOTE  Subjective:    Patient ID: Dawn Meza, female    DOB: [birth year], 27 y.o.   MRN: [id number]  HPI  Patient is a 27 y.o. G1P0 female who presents for consultation for IUD insertion under sedation. She was referred from [name], CNM.  She desires an IUD but find pelvic exams very difficult to tolerate. She is currently taking Micronor for contraception, but notes difficulty remembering taking pills.  Has also tried Depo Provera before but had irregular bleeding. Unable to utilize estrogen-containing products due to history of migraines.  Patient's last menstrual period was 12/13/2022.     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  She  has a past surgical history that includes Appendectomy and Esophagogastroduodenoscopy (egd) with propofol (N/A, 07/26/2020).  She  reports that she has quit smoking. Her smoking use included cigarettes. She has never used smokeless tobacco. She reports that she does not currently use drugs. Frequency: 7.00 times per week. She reports that she does not drink alcohol.  Current Outpatient Medications on File Prior to Visit  Medication Sig Dispense Refill   albuterol (VENTOLIN HFA) 108 (90 Base) MCG/ACT inhaler Inhale 1 puff into the lungs every 6 (six) hours as needed.     norethindrone (MICRONOR) 0.35 MG tablet Take 1 tablet (0.35 mg total) by mouth daily. 84 tablet 0   No current facility-administered medications on file prior to visit.   She has No Known Allergies..  Review of Systems Pertinent items noted in HPI and remainder of comprehensive ROS otherwise  negative.   Objective:   Blood pressure 98/62, pulse 84, resp. rate 16, height 5' (1.524 m), weight 90 lb 6.4 oz (41 kg), last menstrual period 12/13/2022. Body mass index is 17.66 kg/m. General appearance: alert, cooperative, and no distress Remainder of exam deferred   Assessment:   1. Encounter for other general counseling or advice on contraception    Plan:   - Lengthy discussion had with patient regarding risks vs benefits of IUD insertion under general anesthesia/conscious sedation in the hospital, vs attempt to perform procedure in office with mild to moderate sedation with anxiolytic, and pain management with Ibuprofen and local injectable anesthetic. Patient willing to try in office procedure first.  LMP was this Sunday. Will try to schedule for later this week.    A total of 25 minutes were spent face-to-face with the patient during this encounter and over half of that time involved counseling and coordination of care.   Hildred Laser, MD [facility]

## 2022-12-17 NOTE — Progress Notes (Signed)
     GYNECOLOGY OFFICE PROCEDURE NOTE  Dawn Meza is a 27 y.o. G1P0 here for Mirena IUD insertion. No GYN concerns.  Last pap smear was on 09/25/2020 and was normal.  Patient has significant anxiety regarding today's procedure. Took Xanax as anxiolytic prior to procedure.   IUD Insertion Procedure Note Patient identified, informed consent performed, consent signed.   Discussed risks of irregular bleeding, cramping, infection, malpositioning or misplacement of the IUD outside the uterus which may require further procedure such as laparoscopy. Also discussed >99% contraception efficacy, increased risk of ectopic pregnancy with failure of method.   Emphasized that this did not protect against STIs, condoms recommended during all sexual encounters. Time out was performed.  Urine pregnancy test negative.  Speculum placed in the vagina.  Cervix visualized.  Cleaned with Betadine x 2.  Hurricane spray applied to the cervix.  Next, 8 ml of 1% lidocaine was injected circumferentially into the cervix for additional analgesia.  The cervix was grasped anteriorly with a single tooth tenaculum.  Uterus sounded to 6 cm.  Mirena IUD placed per manufacturer's recommendations, however with withdrawal of the applicator, tip of IUD noted to be protuding from cervical os. A Boseman forceps was then used to grasp the tip of the IUD and advance to the fundus.  Strings trimmed to 3 cm. Tenaculum was removed, good hemostasis noted.  Patient tolerated procedure well.   Patient was given post-procedure instructions.  She was advised to have backup contraception for one week.  Patient was also asked to check IUD strings periodically and follow up in 4 weeks for IUD check, will also perform ultrasound at that time to confirm correct placement.    Lot: [id number] Exp: 12/2024  Hildred Laser, MD [facility]

## 2022-12-17 NOTE — Patient Instructions (Signed)
Intrauterine Device Insertion An intrauterine device (IUD) is a medical device that is inserted into the uterus to prevent pregnancy. It is a small, T-shaped device that has one or two nylon strings hanging down from it. The strings hang out of the lower part of the uterus (cervix) to allow for future IUD removal. There are two types of IUDs: Hormone IUD. This type of IUD is made of plastic and contains the hormone progestin (synthetic progesterone). A hormone IUD may last 3-5 years, depending on which one you have. Synthetic progesterone prevents pregnancy by: Thickening cervical mucus to prevent sperm from entering the uterus. Thinning the uterine lining to prevent a fertilized egg from implanting there. Copper IUD. This type of IUD has copper wire wrapped around it. A copper IUD may last up to 10 years. Copper prevents pregnancy by making the uterus and fallopian tubes produce a fluid that kills sperm. Tell a health care provider about: Any allergies you have. All medicines you are taking, including vitamins, herbs, eye drops, creams, and over-the-counter medicines. Any surgeries you have had. Any medical conditions you have, including any sexually transmitted infections (STIs) you may have. Whether you are pregnant or may be pregnant. What are the risks? Generally, this is a safe procedure. However, problems may occur, including: Infection. Bleeding. Allergic reactions to medicines. Puncture (perforation) of the uterus or damage to other structures or organs. Accidental placement of the IUD either in the muscle layer of the uterus (myometrium) or outside the uterus. The IUD falling out of the uterus (expulsion). This is more common among women who have recently had a child. Higher risk of an egg being fertilized outside your uterus (ectopic pregnancy).This is rare. Pelvic inflammatory disease (PID), which is an infection in the uterus and fallopian tubes. The IUD does not cause the  infection. The infection is usually from an unknown sexually transmitted infection (STI). This is rare, and it usually happens during the first 20 days after the IUD is inserted. What happens before the procedure? Ask your health care provider about: Changing or stopping your regular medicines. This is especially important if you are taking diabetes medicines or blood thinners. Taking over-the-counter medicines, vitamins, herbs, and supplements. Talk with your health care provider about when to schedule your IUD placement. Your health care provider may recommend taking over-the-counter pain medicines before the procedure. These medicines include ibuprofen and naproxen. You may have tests for: Pregnancy. A pregnancy test involves having a urine or blood sample taken. Sexually transmitted infections (STIs). Placing an IUD in someone who has an STI can make the infection worse. Cervical cancer. You may have a Pap test to check for this type of cancer. This means collecting cells from your cervix to be checked under a microscope. You may have a physical exam to determine the size and position of your uterus. What happens during the procedure? A tool (speculum) will be placed in your vagina and widened so that your health care provider can see your cervix. Medicine, or antiseptic, may be applied to your cervix to help lower your risk of infection. You may be given an anesthetic medicine to numb each side of your cervix. This medicine is usually given by an injection into the cervix. A tool called a uterine sound will be inserted into your uterus to check the length of your uterus and the direction that your uterus may be tilted. A slim instrument or tube (IUD inserter) that holds the IUD will be inserted into your vagina,  through your cervical canal, and into your uterus. The IUD will be placed in the uterus, and the IUD inserter will be removed. The strings that are attached to the IUD will be trimmed  so that they lie just below the cervix. The speculum will be removed. The procedure may vary among health care providers and hospitals. What can I expect after procedure? You may have bleeding after the procedure. This is normal. It varies from light bleeding (spotting) for a few days to menstrual-like bleeding. You may have cramping and pain in the abdomen. You may feel dizzy or light-headed. You may have lower back pain. You may have headaches and nausea. Follow these instructions at home: Before resuming sexual activity, check to make sure that you can feel the IUD string or strings. You should be able to feel the end of the string below the opening of your cervix. If your IUD string is in place, you may resume sexual activity. If you had a hormonal IUD inserted more than 7 days after your most recent period started, you will need to use a backup method of birth control for 7 days after IUD insertion. Ask your health care provider whether this applies to you. Continue to check that the IUD is still in place by feeling for the strings after every menstrual period, or once a month. An IUD will not protect you from sexually transmitted infections (STIs). Use methods to prevent the exchange of body fluids between partners (barrier protection) every time you have sex. Barrier protection can be used during oral, vaginal, or anal sex. Commonly used barrier methods include: Female condom. Female condom. Dental dam. Take over-the-counter and prescription medicines only as told by your health care provider. Keep all follow-up visits. This is important. Contact a health care provider if: You feel light-headed or weak. You have any of the following problems with your IUD string or strings: The string bothers or hurts you or your sexual partner. You cannot feel the string. The string has gotten longer. You can feel the IUD in your vagina. You think you may be pregnant, or you miss your menstrual  period. You think you may have a sexually transmitted infection (STI). Get help right away if you: You have flu-like symptoms, such as tiredness (fatigue) and muscle aches. You have a fever and chills. You have bleeding that is heavier or lasts longer than a normal menstrual cycle. You have abnormal or bad-smelling discharge from your vagina. You develop abdominal pain that is new, is getting worse, or is not in the same area of earlier cramping and pain. You have pain during sexual activity. Summary An intrauterine device (IUD) is a small, T-shaped device that has one or two nylon strings hanging down from it. You may have a copper IUD or a hormone IUD. Ask your health care provider what you need to do before the procedure. You may have some tests and you may have to change or stop some medicines. You may have bleeding after the procedure. This is normal. It varies from light spotting for a few days to menstrual-like bleeding. Check to make sure that you can feel the IUD strings before you resume sexual activity. Check the strings after every menstrual period or once a month. An IUD does not protect against STIs. Use other methods to protect yourself against infections. This information is not intended to replace advice given to you by your health care provider. Make sure you discuss any questions you have with  your health care provider. Document Revised: 11/15/2019 Document Reviewed: 11/15/2019 Elsevier Patient Education  2024 Elsevier Inc. IUD PLACEMENT POST-PROCEDURE INSTRUCTIONS  You may take Ibuprofen, Aleve or Tylenol for pain if needed.  Cramping should resolve within in 24 hours.  You may have a small amount of spotting.  You should wear a mini pad for the next few days.  You may have intercourse after 24 hours.  If you using this for birth control, it is effective immediately.  You need to call if you have any pelvic pain, fever, heavy bleeding or foul smelling vaginal discharge.   Irregular bleeding is common the first several months after having an IUD placed. You do not need to call for this reason unless you are concerned.  Shower or bathe as normal  You should have a follow-up appointment in 4-8 weeks for a re-check to make sure you are not having any problems.

## 2022-12-18 ENCOUNTER — Encounter: Payer: Self-pay | Admitting: Obstetrics and Gynecology

## 2022-12-18 ENCOUNTER — Ambulatory Visit: Payer: Medicaid Other | Admitting: Obstetrics and Gynecology

## 2022-12-18 VITALS — BP 126/72 | HR 115 | Resp 16 | Ht 60.0 in | Wt 90.4 lb

## 2022-12-18 DIAGNOSIS — Z30430 Encounter for insertion of intrauterine contraceptive device: Secondary | ICD-10-CM

## 2022-12-18 DIAGNOSIS — Z975 Presence of (intrauterine) contraceptive device: Secondary | ICD-10-CM

## 2022-12-18 MED ORDER — LEVONORGESTREL 20 MCG/DAY IU IUD
1.0000 | INTRAUTERINE_SYSTEM | Freq: Once | INTRAUTERINE | Status: AC
  Administered 2022-12-18: 1 via INTRAUTERINE

## 2022-12-21 ENCOUNTER — Telehealth: Payer: Self-pay

## 2022-12-21 NOTE — Telephone Encounter (Signed)
Pt states any time she moves her hips or if she is bending over she is in pain, she is having so much pain its hard for her to she is taking ibuprofen states its about a 6 of pain, but constant cramping and feeling dizzy at about a 2.

## 2022-12-23 NOTE — Telephone Encounter (Signed)
I ordered an ultrasound for her IUD check, if we can get her scheduled earlier than her appointment so we can follow up to make sure it is in the right place, I would like to do that first.

## 2022-12-23 NOTE — Telephone Encounter (Signed)
Pt called triage stating she is still having pain when she moves around. I asked her if it was only when she's moving around or when she lays down too? She said she feels it when laying down too, it comes in waves. She is taking 600 mg of ibuprofen twice a day, nothing over night. She wants to know if she can come sooner that advised for IUD check. She is spotting some, wearing a pad liner. She is not feeling dizzy anymore. Trying to avoid ER since it will cost her more money.

## 2022-12-25 ENCOUNTER — Ambulatory Visit
Admission: RE | Admit: 2022-12-25 | Discharge: 2022-12-25 | Disposition: A | Discharge status: Home / Self Care | Payer: Medicaid Other | Source: Ambulatory Visit | Attending: Obstetrics and Gynecology | Admitting: Obstetrics and Gynecology

## 2022-12-25 DIAGNOSIS — Z975 Presence of (intrauterine) contraceptive device: Secondary | ICD-10-CM

## 2022-12-25 DIAGNOSIS — N83202 Unspecified ovarian cyst, left side: Secondary | ICD-10-CM | POA: Diagnosis not present

## 2022-12-25 MED ORDER — ACETAMINOPHEN-CODEINE 300-30 MG PO TABS: 1.0000 | ORAL_TABLET | Freq: Four times a day (QID) | ORAL | 0 refills | Status: DC | PRN

## 2022-12-25 NOTE — Telephone Encounter (Signed)
Pt called triage to let us know that her U/S has been completed and if someone can give her results. I advised provider will follow up with once results are reviewed.

## 2022-12-25 NOTE — Telephone Encounter (Signed)
I will send something in to her pharmacy. Yes the cyst could be causing some of her pain. It is about the size of a small plum.  It is a simple cyst, meaning that it is filled with clear fluid. These typically go away on their own, would not require any type of intervention.

## 2022-12-25 NOTE — Addendum Note (Signed)
Addended by: Fabian November on: 12/25/2022 03:03 PM   Modules accepted: Orders

## 2022-12-25 NOTE — Telephone Encounter (Signed)
Called pt, no answer, left msg stating Rx sent to pharmacy and to call me back to go over her other qs.

## 2022-12-25 NOTE — Telephone Encounter (Signed)
Pt called back and is aware

## 2022-12-26 ENCOUNTER — Encounter: Payer: Self-pay | Admitting: Obstetrics and Gynecology

## 2022-12-26 DIAGNOSIS — Z9889 Other specified postprocedural states: Secondary | ICD-10-CM | POA: Diagnosis not present

## 2022-12-26 DIAGNOSIS — Z975 Presence of (intrauterine) contraceptive device: Secondary | ICD-10-CM | POA: Diagnosis not present

## 2022-12-26 DIAGNOSIS — K5909 Other constipation: Secondary | ICD-10-CM | POA: Diagnosis not present

## 2022-12-26 DIAGNOSIS — Z87891 Personal history of nicotine dependence: Secondary | ICD-10-CM | POA: Diagnosis not present

## 2022-12-26 DIAGNOSIS — R1030 Lower abdominal pain, unspecified: Secondary | ICD-10-CM | POA: Diagnosis not present

## 2022-12-26 DIAGNOSIS — R110 Nausea: Secondary | ICD-10-CM | POA: Diagnosis not present

## 2022-12-26 DIAGNOSIS — N939 Abnormal uterine and vaginal bleeding, unspecified: Secondary | ICD-10-CM | POA: Diagnosis not present

## 2022-12-29 MED ORDER — ACETAMINOPHEN-CODEINE 300-30 MG PO TABS: 1.0000 | ORAL_TABLET | Freq: Four times a day (QID) | ORAL | 0 refills | Status: DC | PRN

## 2022-12-29 NOTE — Addendum Note (Signed)
Addended by: Fabian November on: 12/29/2022 01:42 PM   Modules accepted: Orders

## 2022-12-29 NOTE — Telephone Encounter (Signed)
Spoke with pharmacist at [facility] to see if rx is able to be transferred to [facility]. Pharmacist states because it is a controlled substance and she has never filled it before, they are unable to transfer. Advised pharmacist to cancel rx and we will resend to [facility].

## 2023-01-27 ENCOUNTER — Encounter: Payer: Self-pay | Admitting: Obstetrics

## 2023-01-27 ENCOUNTER — Ambulatory Visit: Payer: Medicaid Other | Admitting: Obstetrics

## 2023-01-27 VITALS — BP 113/75 | HR 88 | Ht 60.0 in | Wt 88.0 lb

## 2023-01-27 DIAGNOSIS — Z30431 Encounter for routine checking of intrauterine contraceptive device: Secondary | ICD-10-CM | POA: Diagnosis not present

## 2023-01-27 NOTE — Progress Notes (Signed)
   GYN ENCOUNTER  Subjective  HPI: Dawn Meza is a 27 y.o. G1P0 who presents today for an IUD check. She states that she has continued to have vaginal bleeding since her IUD was placed. She had severe cramping for the first few weeks after having it placed. An [place] after placement determined that it was in the correct position. She also noted decreased appetite and increased anxiety. She would also like the strings trimmed.  Past Medical History:  Diagnosis Date   Acute bacterial sinusitis 09/10/2018   Anxiety    Asthma    Depression    Dysmenorrhea    Migraine with aura    Ovarian cyst    Suicide attempt (HCC)    Two vessel umbilical cord, antepartum, single gestation 12/21/2017   Growth scans in third trimester every 4 weeks.   Uterine size date discrepancy pregnancy, second trimester 01/19/2018   Past Surgical History:  Procedure Laterality Date   APPENDECTOMY     ESOPHAGOGASTRODUODENOSCOPY (EGD) WITH PROPOFOL N/A 07/26/2020   Procedure: ESOPHAGOGASTRODUODENOSCOPY (EGD) WITH PROPOFOL;  Surgeon: Pasty Spillers, MD;  Location: [facility] ENDOSCOPY;  Service: Endoscopy;  Laterality: N/A;   OB History     Gravida  1   Para      Term      Preterm      AB      Living  1      SAB      IAB      Ectopic      Multiple      Live Births  1          No Known Allergies  ROS: See HPI  Objective  BP 113/75   Pulse 88   Ht 5' (1.524 m)   Wt 88 lb (39.9 kg)   LMP  (LMP Unknown)   BMI 17.19 kg/m   Physical examination          Abdomen: soft, non-tender, no guarding or rebound tenderness    Pelvic:   Vulva: Normal appearance.  No lesions.  Vagina: No lesions or abnormalities noted.  Support: Normal pelvic support.  Urethra No masses tenderness or scarring.  Meatus Normal size without lesions or prolapse.  Cervix: Normal appearance.  No lesions. IUD strings visible, about 1 cm in length  Anus: Normal exam.  No lesions.  Perineum: Normal exam.  No  lesions.    Assessment  -Bleeding with IUD in place  Plan  -Offered repeat US to confirm that IUD has not migrated. Dawn Meza declines at this time. Discussed a 62-month course of OCPs to control bleeding. Dawn Meza is not a candidate for COCs d/t migraines with aura, but she has POPs. Counseled that may not be as effective for bleeding control but may offer some improvement. Offered removal of IUD if symptoms are not tolerable. Dawn Meza plans to try POPs for now and will make an appt if she desires an [place] or IUD removal.  Dawn Meza Spanish, CNM

## 2023-02-07 DIAGNOSIS — R1110 Vomiting, unspecified: Secondary | ICD-10-CM | POA: Diagnosis not present

## 2023-02-07 DIAGNOSIS — J988 Other specified respiratory disorders: Secondary | ICD-10-CM | POA: Diagnosis not present

## 2023-02-07 DIAGNOSIS — R55 Syncope and collapse: Secondary | ICD-10-CM | POA: Diagnosis not present

## 2023-02-07 DIAGNOSIS — Z9151 Personal history of suicidal behavior: Secondary | ICD-10-CM | POA: Diagnosis not present

## 2023-02-07 DIAGNOSIS — R079 Chest pain, unspecified: Secondary | ICD-10-CM | POA: Diagnosis not present

## 2023-02-07 DIAGNOSIS — J029 Acute pharyngitis, unspecified: Secondary | ICD-10-CM | POA: Diagnosis not present

## 2023-02-07 DIAGNOSIS — R519 Headache, unspecified: Secondary | ICD-10-CM | POA: Diagnosis not present

## 2023-02-07 DIAGNOSIS — R197 Diarrhea, unspecified: Secondary | ICD-10-CM | POA: Diagnosis not present

## 2023-02-07 DIAGNOSIS — R6883 Chills (without fever): Secondary | ICD-10-CM | POA: Diagnosis not present

## 2023-02-07 DIAGNOSIS — R42 Dizziness and giddiness: Secondary | ICD-10-CM | POA: Diagnosis not present

## 2023-02-07 DIAGNOSIS — B9789 Other viral agents as the cause of diseases classified elsewhere: Secondary | ICD-10-CM | POA: Diagnosis not present

## 2023-02-10 DIAGNOSIS — R051 Acute cough: Secondary | ICD-10-CM | POA: Diagnosis not present

## 2023-02-10 DIAGNOSIS — J069 Acute upper respiratory infection, unspecified: Secondary | ICD-10-CM | POA: Diagnosis not present

## 2023-02-16 DIAGNOSIS — M542 Cervicalgia: Secondary | ICD-10-CM | POA: Diagnosis not present

## 2023-02-16 DIAGNOSIS — M9905 Segmental and somatic dysfunction of pelvic region: Secondary | ICD-10-CM | POA: Diagnosis not present

## 2023-02-16 DIAGNOSIS — M9902 Segmental and somatic dysfunction of thoracic region: Secondary | ICD-10-CM | POA: Diagnosis not present

## 2023-02-16 DIAGNOSIS — M62830 Muscle spasm of back: Secondary | ICD-10-CM | POA: Diagnosis not present

## 2023-02-16 DIAGNOSIS — M9901 Segmental and somatic dysfunction of cervical region: Secondary | ICD-10-CM | POA: Diagnosis not present

## 2023-02-16 DIAGNOSIS — M545 Low back pain, unspecified: Secondary | ICD-10-CM | POA: Diagnosis not present

## 2023-02-16 DIAGNOSIS — M546 Pain in thoracic spine: Secondary | ICD-10-CM | POA: Diagnosis not present

## 2023-02-16 DIAGNOSIS — M955 Acquired deformity of pelvis: Secondary | ICD-10-CM | POA: Diagnosis not present

## 2023-02-16 DIAGNOSIS — R519 Headache, unspecified: Secondary | ICD-10-CM | POA: Diagnosis not present

## 2023-02-16 DIAGNOSIS — M9903 Segmental and somatic dysfunction of lumbar region: Secondary | ICD-10-CM | POA: Diagnosis not present

## 2023-03-05 ENCOUNTER — Encounter: Payer: Self-pay | Admitting: Obstetrics and Gynecology

## 2023-05-02 ENCOUNTER — Emergency Department
Admission: EM | Admit: 2023-05-02 | Discharge: 2023-05-02 | Disposition: A | Discharge status: Home / Self Care | Payer: Medicaid Other | Attending: Student in an Organized Health Care Education/Training Program | Admitting: Student in an Organized Health Care Education/Training Program

## 2023-05-02 ENCOUNTER — Emergency Department: Payer: Medicaid Other

## 2023-05-02 ENCOUNTER — Other Ambulatory Visit: Payer: Self-pay

## 2023-05-02 DIAGNOSIS — H53149 Visual discomfort, unspecified: Secondary | ICD-10-CM | POA: Insufficient documentation

## 2023-05-02 DIAGNOSIS — R110 Nausea: Secondary | ICD-10-CM | POA: Insufficient documentation

## 2023-05-02 DIAGNOSIS — R519 Headache, unspecified: Secondary | ICD-10-CM | POA: Diagnosis not present

## 2023-05-02 LAB — COMPREHENSIVE METABOLIC PANEL
ALT: 15 U/L (ref 0–44)
AST: 16 U/L (ref 15–41)
Albumin: 4.2 g/dL (ref 3.5–5.0)
Alkaline Phosphatase: 41 U/L (ref 38–126)
Anion gap: 5 (ref 5–15)
BUN: 12 mg/dL (ref 6–20)
CO2: 27 mmol/L (ref 22–32)
Calcium: 8.9 mg/dL (ref 8.9–10.3)
Chloride: 105 mmol/L (ref 98–111)
Creatinine, Ser: 0.72 mg/dL (ref 0.44–1.00)
GFR, Estimated: >60 mL/min (ref >60)
Glucose, Bld: 105 mg/dL — ABNORMAL HIGH (ref 70–99)
Potassium: 3.8 mmol/L (ref 3.5–5.1)
Sodium: 137 mmol/L (ref 135–145)
Total Bilirubin: 0.8 mg/dL (ref <1.2)
Total Protein: 6.9 g/dL (ref 6.5–8.1)

## 2023-05-02 LAB — CBC
HCT: 38.5 % (ref 36.0–46.0)
Hemoglobin: 12.4 g/dL (ref 12.0–15.0)
MCH: 27.8 pg (ref 26.0–34.0)
MCHC: 32.2 g/dL (ref 30.0–36.0)
MCV: 86.3 fL (ref 80.0–100.0)
Platelets: 287 10*3/uL (ref 150–400)
RBC: 4.46 MIL/uL (ref 3.87–5.11)
RDW: 12.5 % (ref 11.5–15.5)
WBC: 5.7 10*3/uL (ref 4.0–10.5)
nRBC: 0 % (ref 0.0–0.2)

## 2023-05-02 LAB — URINALYSIS, ROUTINE W REFLEX MICROSCOPIC
Bilirubin Urine: NEGATIVE
Glucose, UA: NEGATIVE mg/dL
Hgb urine dipstick: NEGATIVE
Ketones, ur: NEGATIVE mg/dL
Leukocytes,Ua: NEGATIVE
Nitrite: NEGATIVE
Protein, ur: NEGATIVE mg/dL
Specific Gravity, Urine: 1.006 (ref 1.005–1.030)
pH: 8 (ref 5.0–8.0)

## 2023-05-02 LAB — POC URINE PREG, ED: Preg Test, Ur: NEGATIVE

## 2023-05-02 LAB — LIPASE, BLOOD: Lipase: 39 U/L (ref 11–51)

## 2023-05-02 MED ORDER — KETOROLAC TROMETHAMINE 30 MG/ML IJ SOLN: 15.0000 mg | Freq: Once | INTRAMUSCULAR | Status: DC

## 2023-05-02 MED ORDER — PROCHLORPERAZINE EDISYLATE 10 MG/2ML IJ SOLN: 10.0000 mg | Freq: Once | INTRAMUSCULAR | Status: DC

## 2023-05-02 MED ORDER — ONDANSETRON 4 MG PO TBDP
4.0000 mg | ORAL_TABLET | Freq: Once | ORAL | Status: AC
  Administered 2023-05-02: 4 mg via ORAL
  Filled 2023-05-02: qty 1

## 2023-05-02 MED ORDER — DEXAMETHASONE SODIUM PHOSPHATE 10 MG/ML IJ SOLN
8.0000 mg | Freq: Once | INTRAMUSCULAR | Status: AC
  Administered 2023-05-02: 8 mg via INTRAMUSCULAR
  Filled 2023-05-02: qty 1

## 2023-05-02 MED ORDER — DEXAMETHASONE SODIUM PHOSPHATE 10 MG/ML IJ SOLN: 8.0000 mg | Freq: Once | INTRAMUSCULAR | Status: DC

## 2023-05-02 MED ORDER — DIPHENHYDRAMINE HCL 50 MG/ML IJ SOLN: 12.5000 mg | Freq: Once | INTRAMUSCULAR | Status: DC

## 2023-05-02 MED ORDER — KETOROLAC TROMETHAMINE 30 MG/ML IJ SOLN
15.0000 mg | Freq: Once | INTRAMUSCULAR | Status: AC
  Administered 2023-05-02: 15 mg via INTRAMUSCULAR
  Filled 2023-05-02: qty 1

## 2023-05-02 MED ORDER — ONDANSETRON 4 MG PO TBDP: 4.0000 mg | ORAL_TABLET | Freq: Three times a day (TID) | ORAL | 0 refills | Status: DC | PRN

## 2023-05-02 NOTE — ED Provider Notes (Signed)
[facility] Provider Note    Event Date/Time   First MD Initiated Contact with Patient 05/02/23 1411     (approximate)   History   Headache   HPI  Dawn Meza is a 27 y.o. female with a history of headaches presents to the ER for evaluation of 4 days of an intractable headache.  Associate with nausea some photophobia and phonophobia.  Has a history of migraines and somewhat similar to previous episodes but longer in duration.  No sudden onset and headache.     Physical Exam   Triage Vital Signs: ED Triage Vitals  Encounter Vitals Group     BP 05/02/23 1238 117/85     Systolic BP Percentile --      Diastolic BP Percentile --      Pulse Rate 05/02/23 1238 88     Resp 05/02/23 1238 18     Temp 05/02/23 1238 98 F (36.7 C)     Temp src --      SpO2 05/02/23 1238 100 %     Weight 05/02/23 1236 90 lb (40.8 kg)     Height 05/02/23 1236 5' (1.524 m)     Head Circumference --      Peak Flow --      Pain Score 05/02/23 1236 3     Pain Loc --      Pain Education --      Exclude from Growth Chart --     Most recent vital signs: Vitals:   05/02/23 1238  BP: 117/85  Pulse: 88  Resp: 18  Temp: 98 F (36.7 C)  SpO2: 100%     Constitutional: Alert  Eyes: Conjunctivae are normal.  Head: Atraumatic. Nose: No congestion/rhinnorhea. Mouth/Throat: Mucous membranes are moist.   Neck: Painless ROM.  Cardiovascular:   Good peripheral circulation. Respiratory: Normal respiratory effort.  No retractions.  Gastrointestinal: Soft and nontender.  Musculoskeletal:  no deformity Neurologic:  MAE spontaneously. No gross focal neurologic deficits are appreciated.  Skin:  Skin is warm, dry and intact. No rash noted. Psychiatric: Mood and affect are normal. Speech and behavior are normal.    ED Results / Procedures / Treatments   Labs (all labs ordered are listed, but only abnormal results are displayed) Labs Reviewed  COMPREHENSIVE METABOLIC PANEL -  Abnormal; Notable for the following components:      Result Value   Glucose, Bld 105 (*)    All other components within normal limits  URINALYSIS, ROUTINE W REFLEX MICROSCOPIC - Abnormal; Notable for the following components:   Color, Urine STRAW (*)    APPearance CLEAR (*)    All other components within normal limits  LIPASE, BLOOD  CBC  POC URINE PREG, ED     EKG     RADIOLOGY Please see ED Course for my review and interpretation.  I personally reviewed all radiographic images ordered to evaluate for the above acute complaints and reviewed radiology reports and findings.  These findings were personally discussed with the patient.  Please see medical record for radiology report.    PROCEDURES:  Critical Care performed:   Procedures   MEDICATIONS ORDERED IN ED: Medications  dexamethasone (DECADRON) injection 8 mg (has no administration in time range)  ketorolac (TORADOL) 30 MG/ML injection 15 mg (has no administration in time range)  ondansetron (ZOFRAN-ODT) disintegrating tablet 4 mg (has no administration in time range)     IMPRESSION / MDM / ASSESSMENT AND PLAN / ED COURSE  I reviewed the triage vital signs and the nursing notes.                              Differential diagnosis includes, but is not limited to, migraine, status migrainosus, SDH, IVH, mass, sinusitis, tension, cluster, meningitis  Patient presenting to the ER for evaluation of symptoms as described above.  Based on symptoms, risk factors and considered above differential, this presenting complaint could reflect a potentially life-threatening illness therefore the patient will be placed on continuous pulse oximetry and telemetry for monitoring.  Laboratory evaluation will be sent to evaluate for the above complaints.  Patient is nontoxic-appearing CT imaging ordered out of triage without evidence of mass or IPH or sinusitis.  Not clinically consistent with encephalitis or meningitis.  Given  reassuring workup will treat symptomatically.  Suspect status migrainosus though symptoms seem to be mild.  Patient deferring IV treatment prefer injection.  She does appear clinically stable and appropriate for outpatient follow-up and referral to neurology.  Demonstrates understanding signs symptoms which she should return to the ER.       FINAL CLINICAL IMPRESSION(S) / ED DIAGNOSES   Final diagnoses:  Bad headache     Rx / DC Orders   ED Discharge Orders     None        Note:  This document was prepared using Dragon voice recognition software and may include unintentional dictation errors.    Willy Eddy, MD 05/02/23 1440

## 2023-05-02 NOTE — ED Triage Notes (Signed)
Pt comes with c/o headache for over week now. Pt  states she will have it and go to bed then wakes up and it is gone. Pt states then throughout the day it comes back.  Pt also states some cramping. Pt states nausea.

## 2023-05-02 NOTE — Discharge Instructions (Signed)

## 2023-06-16 ENCOUNTER — Ambulatory Visit: Payer: Medicaid Other | Admitting: Obstetrics

## 2023-06-16 ENCOUNTER — Encounter: Payer: Self-pay | Admitting: Obstetrics

## 2023-06-16 VITALS — BP 100/70 | Ht 60.0 in | Wt 88.0 lb

## 2023-06-16 DIAGNOSIS — Z30431 Encounter for routine checking of intrauterine contraceptive device: Secondary | ICD-10-CM | POA: Diagnosis not present

## 2023-06-16 DIAGNOSIS — N898 Other specified noninflammatory disorders of vagina: Secondary | ICD-10-CM | POA: Diagnosis not present

## 2023-06-16 NOTE — Progress Notes (Signed)
    GYNECOLOGY PROGRESS NOTE  Subjective:  PCP: Pcp, No  Patient ID: Dawn Meza, female    DOB: May 05, 1996, 28 y.o.   MRN: [id number]  HPI  Patient is a 28 y.o. G1P0 female who presents for painful intercourse since she got the Mirena IUD, she has abnormal light pink spotting and has questions about her hormones. Wondering if they are "off" due to feeling like her breast size is smaller, she is having some vaginal dryness. States a CNM looked at the IUD strings and mentioned they could be "cutting" her.   The following portions of the patient's history were reviewed and updated as appropriate: allergies, current medications, past family history, past medical history, past social history, past surgical history, and problem list.  Review of Systems Pertinent items are noted in HPI.   Objective:   Blood pressure 100/70, height 5' (1.524 m), weight 88 lb (39.9 kg), last menstrual period 06/04/2023. Body mass index is 17.19 kg/m.  General appearance: alert, cooperative, and no distress Abdomen: soft, non-tender; bowel sounds normal; no masses,  no organomegaly Pelvic: cervix normal in appearance, external genitalia normal, no adnexal masses or tenderness, no cervical motion tenderness, rectovaginal septum normal, uterus normal size, shape, and consistency, vagina normal without discharge, and IUD strings visible, 1cm Extremities: extremities normal, atraumatic, no cyanosis or edema Neurologic: Grossly normal   Assessment/Plan:   1. IUD check up   2. Vaginal dryness     Mirena IUD in correct place, strings visible. Reassurance that they are not "cutting" her and we discussed the spotting is expected with a Mirena. Even with the discomfort with intercourse, pt desires to keep in place. Encouraged different positions to avoid hitting her cervix, and for the dryness, would recommend trialing different lubricants and can also add a vaginal moisturizer prn. Follow up for next annual when due.     Julieanne Manson, DO [facility]

## 2023-07-14 DIAGNOSIS — G43901 Migraine, unspecified, not intractable, with status migrainosus: Secondary | ICD-10-CM | POA: Diagnosis not present

## 2023-07-14 DIAGNOSIS — R110 Nausea: Secondary | ICD-10-CM | POA: Diagnosis not present

## 2023-07-14 DIAGNOSIS — F40298 Other specified phobia: Secondary | ICD-10-CM | POA: Diagnosis not present

## 2023-07-14 DIAGNOSIS — H53149 Visual discomfort, unspecified: Secondary | ICD-10-CM | POA: Diagnosis not present

## 2023-07-14 DIAGNOSIS — F411 Generalized anxiety disorder: Secondary | ICD-10-CM | POA: Diagnosis not present

## 2023-08-03 ENCOUNTER — Ambulatory Visit: Payer: Self-pay | Admitting: Pediatrics

## 2023-08-03 ENCOUNTER — Ambulatory Visit: Attending: Pediatrics

## 2023-08-03 ENCOUNTER — Encounter: Payer: Self-pay | Admitting: Pediatrics

## 2023-08-03 VITALS — BP 103/68 | HR 98 | Temp 98.4°F | Resp 16 | Ht 60.87 in | Wt 88.2 lb

## 2023-08-03 DIAGNOSIS — Z1330 Encounter for screening examination for mental health and behavioral disorders, unspecified: Secondary | ICD-10-CM | POA: Diagnosis not present

## 2023-08-03 DIAGNOSIS — F419 Anxiety disorder, unspecified: Secondary | ICD-10-CM

## 2023-08-03 DIAGNOSIS — G43809 Other migraine, not intractable, without status migrainosus: Secondary | ICD-10-CM

## 2023-08-03 DIAGNOSIS — R002 Palpitations: Secondary | ICD-10-CM | POA: Diagnosis not present

## 2023-08-03 DIAGNOSIS — Z7689 Persons encountering health services in other specified circumstances: Secondary | ICD-10-CM | POA: Diagnosis not present

## 2023-08-03 DIAGNOSIS — R5383 Other fatigue: Secondary | ICD-10-CM | POA: Diagnosis not present

## 2023-08-03 DIAGNOSIS — F4323 Adjustment disorder with mixed anxiety and depressed mood: Secondary | ICD-10-CM | POA: Diagnosis not present

## 2023-08-03 MED ORDER — BUSPIRONE HCL 10 MG PO TABS: 10.0000 mg | ORAL_TABLET | Freq: Two times a day (BID) | ORAL | 0 refills | Status: DC | PRN

## 2023-08-03 NOTE — Progress Notes (Signed)
 Establish Care Note  BP 103/68 (BP Location: Left Arm, Patient Position: Sitting, Cuff Size: Small)   Pulse 98   Temp 98.4 F (36.9 C) (Oral)   Resp 16   Ht 5' 0.87" (1.546 m)   Wt 88 lb 3.2 oz (40 kg)   LMP 07/04/2023 (Exact Date)   SpO2 98%   BMI 16.74 kg/m    Subjective:    Patient ID: Dawn Meza, female    DOB: 09/02/1995, 28 y.o.   MRN: [id number]  HPI: Dawn Meza is a 28 y.o. female  Chief Complaint  Patient presents with   Establish Care    Previous PCP [facility] internal med through [facility].    Heart Problem    Been told POTS, SVT's but unsure formal diagnosis.    Fatigue    Ongong for over a year, sleeps well most nights however still feels very tired.    Anxiety    Ongoing for as long she remembers, mom also suffers from   Migraine    Worried about driving. These are fairly new.    Contraception    Is concerned that since getting Mirena she has had hot flashes and just feeling off. Mentioned to prior OB. Started when she started progestrone only BC but increased on Mirena hair growth, loss of breast tissue and hot flashes. Also mentions she can't keep weight on now.     Establishing care, the following was discussed today:  Discussed the use of AI scribe software for clinical note transcription with the patient, who gave verbal consent to proceed.  History of Present Illness   Dawn Meza is a 28 year old female who presents with excessive sweating and heat flashes. She is accompanied by her daughter, Dawn Meza. She was referred by her mother for evaluation of anxiety and weight issues.  She experiences excessive sweating and heat flashes, which are her primary concerns during this visit. These symptoms are accompanied by increased anxiety, described as being at 'top tier' levels. She experiences restlessness, fidgetiness, and difficulty sleeping, sometimes only getting six hours of sleep depending on her work schedule. She occasionally naps at school due to  fatigue.  She has a history of heart palpitations that began in 2023 after contracting COVID-19. She describes episodes of dizziness upon standing and a heart rate reaching 180 bpm, as recorded by her Apple Watch. A Holter monitor previously identified two or three episodes of supraventricular tachycardia (SVT), but there was disagreement among her doctors regarding the need for an ablation.  She reports difficulty gaining weight and is actively losing weight unless she eats excessively. She describes feeling nauseous from the amount of food she needs to consume to maintain her weight. She previously experienced bowel movement issues, which resolved in November 2024.  She has been experiencing dissociation, which she attributes to stress and lack of sleep. This has been occurring frequently enough to be concerning, and she has sought therapy, although her sessions are limited by her job's coverage.  She mentions a history of elevated liver enzymes, which were noted during or after pregnancy but normalized upon repeat testing. She does not consume alcohol.        Current Outpatient Medications on File Prior to Visit  Medication Sig Dispense Refill   albuterol (VENTOLIN HFA) 108 (90 Base) MCG/ACT inhaler Inhale 1 puff into the lungs every 6 (six) hours as needed.     levonorgestrel (MIRENA) 20 MCG/DAY IUD 1 each by Intrauterine route once.  ondansetron (ZOFRAN-ODT) 4 MG disintegrating tablet Take 1 tablet (4 mg total) by mouth every 8 (eight) hours as needed for nausea or vomiting. 16 tablet 0   rizatriptan (MAXALT) 10 MG tablet Take 10 mg by mouth as needed for migraine.     No current facility-administered medications on file prior to visit.    #HM Will review HM records and updated as needed.  Relevant past medical, surgical, family and social history reviewed and updated as indicated. Interim medical history since our last visit reviewed. Allergies and medications reviewed and  updated.  ROS per HPI unless specifically indicated above     Objective:    BP 103/68 (BP Location: Left Arm, Patient Position: Sitting, Cuff Size: Small)   Pulse 98   Temp 98.4 F (36.9 C) (Oral)   Resp 16   Ht 5' 0.87" (1.546 m)   Wt 88 lb 3.2 oz (40 kg)   LMP 07/04/2023 (Exact Date)   SpO2 98%   BMI 16.74 kg/m   Wt Readings from Last 3 Encounters:  08/03/23 88 lb 3.2 oz (40 kg)  06/16/23 88 lb (39.9 kg)  05/02/23 90 lb (40.8 kg)     Physical Exam Constitutional:      Appearance: Normal appearance.  HENT:     Head: Normocephalic and atraumatic.  Eyes:     Pupils: Pupils are equal, round, and reactive to light.  Cardiovascular:     Rate and Rhythm: Normal rate and regular rhythm.     Pulses: Normal pulses.     Heart sounds: Normal heart sounds.  Pulmonary:     Effort: Pulmonary effort is normal.     Breath sounds: Normal breath sounds.  Abdominal:     General: Abdomen is flat.     Palpations: Abdomen is soft.  Musculoskeletal:        General: Normal range of motion.     Cervical back: Normal range of motion.  Skin:    General: Skin is warm and dry.     Capillary Refill: Capillary refill takes less than 2 seconds.  Neurological:     General: No focal deficit present.     Mental Status: She is alert. Mental status is at baseline.  Psychiatric:        Mood and Affect: Mood normal.        Behavior: Behavior normal.         08/03/2023    3:20 PM  Depression screen PHQ 2/9  Decreased Interest 1  Down, Depressed, Hopeless 1  PHQ - 2 Score 2  Altered sleeping 1  Tired, decreased energy 1  Change in appetite 3  Feeling bad or failure about yourself  1  Trouble concentrating 1  Moving slowly or fidgety/restless 2  Suicidal thoughts 0  PHQ-9 Score 11  Difficult doing work/chores Very difficult        08/03/2023    3:20 PM  GAD 7 : Generalized Anxiety Score  Nervous, Anxious, on Edge 3  Control/stop worrying 1  Worry too much - different things 1   Trouble relaxing 2  Restless 0  Easily annoyed or irritable 0  Anxiety Difficulty Very difficult       Assessment & Plan:  Assessment & Plan   Other fatigue Undifferentiated. Unintentional weight loss despite increased intake. Investigate potential causes including thyroid dysfunction and nutritional deficiencies. Will start with blood work as below and suspect multifactorial etiology as discussed below. - Order lab tests including thyroid function, blood sugar, iron,  B12, and folate levels.  -     CBC -     Iron, TIBC and Ferritin Panel -     Folate -     Vitamin B12 -     Comprehensive metabolic panel -     Thyroid Panel With TSH -     Hemoglobin A1c  Adjustment disorder with mixed anxiety and depressed mood Anxiety Assessment & Plan: Anxiety exacerbated with restlessness, excessive blinking, and sleep disturbances. Previous hydroxyzine ineffective. Thyroid dysfunction considered. Genetic testing proposed for medication guidance. Buspar recommended for its safety profile. No acute safety concerns today but she does have h/o suicide attempt. - Order thyroid function tests and genetic testing for psychiatric medication guidance. - Prescribe Buspar (buspirone) 10 mg twice daily as needed for anxiety. - Consider alternative anxiety medication if thyroid issues are ruled out. -     busPIRone HCl; Take 1 tablet (10 mg total) by mouth 2 (two) times daily as needed.  Dispense: 30 tablet; Refill: 0  Other migraine without status migrainosus, not intractable Assessment & Plan: Experiences migraines, prefers as-needed medication. Maxalt recommended for acute management. - Advise use of Maxalt (rizatriptan) as needed for migraines.  Palpitations SVT with episodes recorded on Holter monitor. Previous ablation not pursued. Symptoms include dizziness and elevated heart rate upon standing. Repeat Holter recommended. - Order repeat Holter monitor for at least two weeks. - Consider referral  to [facility] for a second opinion if needed. -     LONG TERM MONITOR (3-14 DAYS); Future  Encounter to establish care Reviewed available patient record including history, medications, problem list. HM updated as able. Will review and/or request outside records (if applicable) and will fill remaining HM gaps as needed at follow up visit.  Encounter for behavioral health screening As part of their intake evaluation, the patient was screened for depression, anxiety.  PHQ9 SCORE 11, GAD7 SCORE 0. Screening results positive for tested conditions. See plan under problem/diagnosis above.  Follow up plan: Return in about 2 weeks (around 08/17/2023) for Mood, Chronic illness f/u.  Jackolyn Confer, MD

## 2023-08-03 NOTE — Patient Instructions (Addendum)
 Buspar 10mg  twice daily as needed for anxiety  Will send another holter to your house  [email or web address] to look for a therapist and I will also look   Good to meet you! Welcome to [facility]!  As your primary care doctor, I look forward to working with you to help you reach your health goals.  Please be aware of a couple of logistical items: - If you message me on mychart, it may take me 1-2 business days to get back to you. This is for non-urgent messaging.  - If you require urgent clinical attention, please call the clinic or present to urgent care/emergency room - If you have labs, I typically will send a message about them in 1-2 business days. - I am not here on Mondays, otherwise will be available from Tuesday-Friday during 8a-5pm.

## 2023-08-04 LAB — HEMOGLOBIN A1C
Est. average glucose Bld gHb Est-mCnc: 105 mg/dL
Hgb A1c MFr Bld: 5.3 % (ref 4.8–5.6)

## 2023-08-04 LAB — THYROID PANEL WITH TSH
Free Thyroxine Index: 2 (ref 1.2–4.9)
T3 Uptake Ratio: 28 % (ref 24–39)
T4, Total: 7.2 ug/dL (ref 4.5–12.0)
TSH: 1.47 u[IU]/mL (ref 0.450–4.500)

## 2023-08-04 LAB — CBC
Hematocrit: 44.9 % (ref 34.0–46.6)
Hemoglobin: 14.7 g/dL (ref 11.1–15.9)
MCH: 28.3 pg (ref 26.6–33.0)
MCHC: 32.7 g/dL (ref 31.5–35.7)
MCV: 86 fL (ref 79–97)
Platelets: 354 10*3/uL (ref 150–450)
RBC: 5.2 x10E6/uL (ref 3.77–5.28)
RDW: 11.8 % (ref 11.7–15.4)
WBC: 6 10*3/uL (ref 3.4–10.8)

## 2023-08-04 LAB — COMPREHENSIVE METABOLIC PANEL
ALT: 19 IU/L (ref 0–32)
AST: 15 IU/L (ref 0–40)
Albumin: 5.2 g/dL — ABNORMAL HIGH (ref 4.0–5.0)
Alkaline Phosphatase: 64 IU/L (ref 44–121)
BUN/Creatinine Ratio: 17 (ref 9–23)
BUN: 15 mg/dL (ref 6–20)
Bilirubin Total: 0.2 mg/dL (ref 0.0–1.2)
CO2: 26 mmol/L (ref 20–29)
Calcium: 9.8 mg/dL (ref 8.7–10.2)
Chloride: 100 mmol/L (ref 96–106)
Creatinine, Ser: 0.88 mg/dL (ref 0.57–1.00)
Globulin, Total: 2.3 g/dL (ref 1.5–4.5)
Glucose: 73 mg/dL (ref 70–99)
Potassium: 4.3 mmol/L (ref 3.5–5.2)
Sodium: 140 mmol/L (ref 134–144)
Total Protein: 7.5 g/dL (ref 6.0–8.5)
eGFR: 92 mL/min/{1.73_m2} (ref >59)

## 2023-08-04 LAB — IRON,TIBC AND FERRITIN PANEL
Ferritin: 62 ng/mL (ref 15–150)
Iron Saturation: 25 % (ref 15–55)
Iron: 92 ug/dL (ref 27–159)
Total Iron Binding Capacity: 375 ug/dL (ref 250–450)
UIBC: 283 ug/dL (ref 131–425)

## 2023-08-04 LAB — VITAMIN B12: Vitamin B-12: 688 pg/mL (ref 232–1245)

## 2023-08-04 LAB — FOLATE: Folate: 17.6 ng/mL (ref >3.0)

## 2023-08-10 ENCOUNTER — Encounter: Payer: Self-pay | Admitting: Pediatrics

## 2023-08-10 DIAGNOSIS — G43909 Migraine, unspecified, not intractable, without status migrainosus: Secondary | ICD-10-CM | POA: Insufficient documentation

## 2023-08-10 DIAGNOSIS — F419 Anxiety disorder, unspecified: Secondary | ICD-10-CM | POA: Insufficient documentation

## 2023-08-10 NOTE — Assessment & Plan Note (Signed)
 Experiences migraines, prefers as-needed medication. Maxalt recommended for acute management. - Advise use of Maxalt (rizatriptan) as needed for migraines.

## 2023-08-10 NOTE — Assessment & Plan Note (Addendum)
 Anxiety exacerbated with restlessness, excessive blinking, and sleep disturbances. Previous hydroxyzine ineffective. Thyroid dysfunction considered. Genetic testing proposed for medication guidance. Buspar recommended for its safety profile. No acute safety concerns today but she does have h/o suicide attempt. - Order thyroid function tests and genetic testing for psychiatric medication guidance. - Prescribe Buspar (buspirone) 10 mg twice daily as needed for anxiety. - Consider alternative anxiety medication if thyroid issues are ruled out.

## 2023-08-24 ENCOUNTER — Ambulatory Visit: Admitting: Pediatrics

## 2023-08-24 ENCOUNTER — Ambulatory Visit
Admission: RE | Admit: 2023-08-24 | Discharge: 2023-08-24 | Disposition: A | Discharge status: Home / Self Care | Source: Ambulatory Visit | Attending: Pediatrics | Admitting: Pediatrics

## 2023-08-24 ENCOUNTER — Encounter: Payer: Self-pay | Admitting: Pediatrics

## 2023-08-24 VITALS — BP 106/72 | HR 67 | Temp 97.9°F | Wt 89.2 lb

## 2023-08-24 DIAGNOSIS — R5383 Other fatigue: Secondary | ICD-10-CM | POA: Diagnosis not present

## 2023-08-24 DIAGNOSIS — R252 Cramp and spasm: Secondary | ICD-10-CM | POA: Diagnosis not present

## 2023-08-24 DIAGNOSIS — G43009 Migraine without aura, not intractable, without status migrainosus: Secondary | ICD-10-CM | POA: Diagnosis not present

## 2023-08-24 DIAGNOSIS — Z1330 Encounter for screening examination for mental health and behavioral disorders, unspecified: Secondary | ICD-10-CM

## 2023-08-24 DIAGNOSIS — F322 Major depressive disorder, single episode, severe without psychotic features: Secondary | ICD-10-CM

## 2023-08-24 DIAGNOSIS — N946 Dysmenorrhea, unspecified: Secondary | ICD-10-CM | POA: Diagnosis not present

## 2023-08-24 MED ORDER — NAPROXEN 500 MG PO TABS: 500.0000 mg | ORAL_TABLET | Freq: Two times a day (BID) | ORAL | 0 refills | Status: AC

## 2023-08-24 MED ORDER — KETOROLAC TROMETHAMINE 30 MG/ML IJ SOLN
30.0000 mg | Freq: Once | INTRAMUSCULAR | Status: AC
  Administered 2023-08-24: 30 mg via INTRAMUSCULAR

## 2023-08-24 NOTE — Patient Instructions (Addendum)
 Plan: - psychiatry referral  - vaginal ultrasound  I sent naproxen for pain sent  - do not take today since we gave you the toradol

## 2023-08-24 NOTE — Assessment & Plan Note (Signed)
 Recent migraine effectively treated with rizatriptan but caused dizziness. Nurtec discussed as alternative. - Provide Nurtec samples as alternative to rizatriptan. - Monitor for dizziness with Nurtec.

## 2023-08-24 NOTE — Assessment & Plan Note (Addendum)
 Anxiety and mood disorder with dissociation. Previous medications not tolerated. Interested in new medications but hesitant to try different types. Psychiatric evaluation preferred. No acute safety concerns. - Refer to psychiatry for evaluation and management. - Consider genetic screening for medication guidance.

## 2023-08-24 NOTE — Progress Notes (Signed)
 Office Visit  BP 106/72   Pulse 67   Temp 97.9 F (36.6 C) (Oral)   Wt 89 lb 3.2 oz (40.5 kg)   LMP 07/04/2023 (Exact Date)   BMI 16.93 kg/m    Subjective:    Patient ID: Dawn Meza, female    DOB: 1996/03/04, 28 y.o.   MRN: [id number]  HPI: Dawn Meza is a 28 y.o. female  Chief Complaint  Patient presents with   Dysmenorrhea    Pt states she woke with serve cramps this morning      Wt Readings from Last 3 Encounters:  08/24/23 89 lb 3.2 oz (40.5 kg)  08/03/23 88 lb 3.2 oz (40 kg)  06/16/23 88 lb (39.9 kg)     Relevant past medical, surgical, family and social history reviewed and updated as indicated. Interim medical history since our last visit reviewed. Allergies and medications reviewed and updated.  ROS per HPI unless specifically indicated above     Objective:    BP 106/72   Pulse 67   Temp 97.9 F (36.6 C) (Oral)   Wt 89 lb 3.2 oz (40.5 kg)   LMP 07/04/2023 (Exact Date)   BMI 16.93 kg/m   Wt Readings from Last 3 Encounters:  08/24/23 89 lb 3.2 oz (40.5 kg)  08/03/23 88 lb 3.2 oz (40 kg)  06/16/23 88 lb (39.9 kg)     Physical Exam Constitutional:      Appearance: Normal appearance.  HENT:     Head: Normocephalic and atraumatic.  Eyes:     Pupils: Pupils are equal, round, and reactive to light.  Cardiovascular:     Rate and Rhythm: Normal rate and regular rhythm.     Pulses: Normal pulses.     Heart sounds: Normal heart sounds.  Pulmonary:     Effort: Pulmonary effort is normal.     Breath sounds: Normal breath sounds.  Abdominal:     General: Abdomen is flat.     Palpations: Abdomen is soft.     Tenderness: There is abdominal tenderness.     Comments: Suprapubic tenderness  Musculoskeletal:        General: Normal range of motion.     Cervical back: Normal range of motion.  Skin:    General: Skin is warm and dry.     Capillary Refill: Capillary refill takes less than 2 seconds.  Neurological:     General: No focal deficit  present.     Mental Status: She is alert. Mental status is at baseline.  Psychiatric:        Mood and Affect: Mood normal.        Behavior: Behavior normal.         08/24/2023   10:46 AM 08/03/2023    3:20 PM  Depression screen PHQ 2/9  Decreased Interest 1 1  Down, Depressed, Hopeless 1 1  PHQ - 2 Score 2 2  Altered sleeping 2 1  Tired, decreased energy 2 1  Change in appetite 0 3  Feeling bad or failure about yourself  1 1  Trouble concentrating 1 1  Moving slowly or fidgety/restless 1 2  Suicidal thoughts 0 0  PHQ-9 Score 9 11  Difficult doing work/chores Somewhat difficult Very difficult       08/24/2023   10:46 AM 08/03/2023    3:20 PM  GAD 7 : Generalized Anxiety Score  Nervous, Anxious, on Edge 1 3  Control/stop worrying 1 1  Worry too much - different things  2 1  Trouble relaxing 2 2  Restless 2 0  Easily annoyed or irritable 2 0  Afraid - awful might happen 0   Total GAD 7 Score 10   Anxiety Difficulty Very difficult Very difficult       Assessment & Plan:  Assessment & Plan   MDD (major depressive disorder), single episode, severe , no psychosis (HCC) Assessment & Plan: Anxiety and mood disorder with dissociation. Previous medications not tolerated. Interested in new medications but hesitant to try different types. Psychiatric evaluation preferred. No acute safety concerns. - Refer to psychiatry for evaluation and management. - Consider genetic screening for medication guidance.  Orders: -     Ambulatory referral to Psychiatry  Migraine without aura and without status migrainosus, not intractable Assessment & Plan: Recent migraine effectively treated with rizatriptan 5mg  (half tab) but caused dizziness. Nurtec discussed as alternative. - Provide Nurtec samples as alternative to rizatriptan. - Monitor for dizziness with Nurtec.  Dysmenorrhea Acute severe abdominal pain with possible ovarian cysts. IUD reduces pregnancy likelihood. Imaging needed for  confirmation. Follows with gyn.  - Order uterine ultrasound for ovarian cyst evaluation. - Administer Toradol injection for pain relief today in clinic - Prescribe naproxen with food for pain management. - Advise against naproxen if Toradol is administered. Can start tomorrow. -     US PELVIC COMPLETE WITH TRANSVAGINAL; Future -     Naproxen; Take 1 tablet (500 mg total) by mouth 2 (two) times daily with a meal for 7 days.  Dispense: 14 tablet; Refill: 0 -     Ketorolac Tromethamine  Other fatigue Chronic fatigue and weight loss with normal labs. Suspect driven primarily by mood as above. Holding steady weight as well. - Complete Holter monitor to rule out cardiac causes. - Refer to psychiatry for mood-related fatigue evaluation.  Encounter for behavioral health screening As part of their intake evaluation, the patient was screened for depression, anxiety.  PHQ9 SCORE 9, GAD7 SCORE 10. Screening results positive for tested conditions. See plan under problem/diagnosis above.   Follow up plan: Return in about 2 months (around 10/24/2023) for Physical.  Jackolyn Confer, MD

## 2023-08-30 NOTE — Progress Notes (Signed)
 Called patient. Left message for patient to call office and schedule follow up for abdominal pain in 2-3 weeks.

## 2023-09-14 ENCOUNTER — Ambulatory Visit (INDEPENDENT_AMBULATORY_CARE_PROVIDER_SITE_OTHER): Admitting: Psychiatry

## 2023-09-14 ENCOUNTER — Encounter: Payer: Self-pay | Admitting: Psychiatry

## 2023-09-14 VITALS — BP 118/74 | HR 101 | Temp 98.4°F | Ht 60.87 in | Wt 89.8 lb

## 2023-09-14 DIAGNOSIS — F331 Major depressive disorder, recurrent, moderate: Secondary | ICD-10-CM | POA: Diagnosis not present

## 2023-09-14 DIAGNOSIS — F411 Generalized anxiety disorder: Secondary | ICD-10-CM

## 2023-09-14 MED ORDER — FLUVOXAMINE MALEATE 25 MG PO TABS: 25.0000 mg | ORAL_TABLET | Freq: Every day | ORAL | 0 refills | Status: DC

## 2023-09-14 NOTE — Progress Notes (Signed)
 Psychiatric Initial Adult Assessment   Patient Identification: Dawn Meza MRN:  [id number] Date of Evaluation:  09/14/2023 Referral Source: Geraldine Kling, MD Chief Complaint:   Chief Complaint  Patient presents with   Establish Care   Visit Diagnosis:    ICD-10-CM   1. Major depressive disorder, recurrent episode, moderate (HCC)  F33.1 fluvoxaMINE (LUVOX) 25 MG tablet    2. Generalized anxiety disorder  F41.1 fluvoxaMINE (LUVOX) 25 MG tablet     Problems Major Depressive Disorder - Start Fluvox 25mg  once a day - Referred to Spravato therapy  Generalized Axiety Disorder -Start Fluvox 25mg  once a day  History of Present Illness: A 28 year old female presenting to [facility] for medication management and establishing care.  Patient reports that she is having depression that has been consistent since she was 58 with since the birth of her 46-year-old.  Patient reports that she is having significant amount of anxiety, panic attacks as well as depression.  Patient reports that she attempted to treat the depression with therapy and tried to approach holistically with no improvement to depression.  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  Patient reports she was initially in school in 2019 but dropped out due to getting pregnant and reports that with the amount of money she makes now as well as where she lives she feels stuck at the moment.  Patient reports a significant amount of depression with anhedonia, loss of interest, decreased appetite, lack of motivation and feeling stuck in her current situation.  Patient reports that she has traveled several medications in the past including Lexapro , Prozac and Zoloft .  Currently the patient cannot be considered for SNRI due to a current complication with cardiac  considerations.  Patient reports that she has been evaluated by cardiology in regards to her tachycardia reporting that she cannot get any medications that could interfere with her heart.  Based on this assessment interview is recommended for the patient be diagnosed with major depressive disorder, recurrent, moderate based on the PHQ-9 score of 18.  Patient also diagnosed with generalized anxiety disorder based on symptoms and scoring 18 on a GAD-7.  Patient is recommended to start on fluvoxamine 25 mg once a day for 2 weeks.  Patient is also recommended for consideration of Spravato and being [facility] to [facility] for consideration qualification.  Patient requested to considers Spravato in which she has been educated that the referral will contact the patient to qualify the patient if patient is able to participate.  Patient has also been educated on TMS therapy and has been shared that this may be a potential therapy.  Patient is in agreement for taking the medication and also considering Spravato patient verbalized understanding and states that she will contact the clinic should her symptoms get worse before the next visit.  Patient will follow up in 2 weeks.  Associated Signs/Symptoms: Depression Symptoms:  anhedonia, fatigue, hopelessness, anxiety, panic attacks, weight loss, decreased appetite, (Hypo) Manic Symptoms: Negative Anxiety Symptoms:  Excessive Worry, Panic Symptoms, Social Anxiety, Psychotic Symptoms: Negative PTSD Symptoms: Negative  Past Psychiatric History:  Previous Psych Hospitalizations:  - 2019, suicidal thoughts.  Outpatient treatment:  - Participated in therapy previously for depression with no success and stop therapy due to no improvement.  Medications Current: - No current  medications  Medication Trials: - Lexapro , side effects and poor response, 2019 -Prozac, side effects and  poor response, 2019 -Zoloft , side effects and poor response,  2019 Suicide & Violence: - Patient denies SI, HI, AVH.  Denies having a firearm in the home Psychotherapy: - Currently not interested in psychotherapy, was previously in therapy for 3 years with no success in improvement of symptoms. Legal:  - Denies legal issues. Previous Psychotropic Medications: No   Substance Abuse History in the last 12 months:  No.  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   Uterine size date discrepancy pregnancy, second trimester 01/19/2018    Past Surgical History:  Procedure Laterality Date   APPENDECTOMY     ESOPHAGOGASTRODUODENOSCOPY (EGD) WITH PROPOFOL  N/A 07/26/2020   Procedure: ESOPHAGOGASTRODUODENOSCOPY (EGD) WITH PROPOFOL ;  Surgeon: Irby Mannan, MD;  Location: [facility] ENDOSCOPY;  Service: Endoscopy;  Laterality: N/A;    Family Psychiatric History: Patient reports mother with depression.  Family History:  Family History  Problem Relation Age of Onset   Hypertension Mother    Ovarian cancer Maternal Aunt 50   Heart disease Maternal Grandfather     Social History:   Social History   Socioeconomic History   Marital status: Single    Spouse name: Not on file   Number of children: 1   Years of education: Not on file   Highest education level: Associate degree: academic program  Occupational History   Not on file  Tobacco Use   Smoking status: Former    Types: Cigarettes   Smokeless tobacco: Never  Vaping Use   Vaping status: Every Day   Substances: Nicotine , Flavoring  Substance and Sexual Activity   Alcohol use: No   Drug use: Not Currently    Frequency: 7.0 times per  week   Sexual activity: Yes    Partners: Male    Birth control/protection: I.U.D.  Other Topics Concern   Not on file  Social History Narrative   Not on file   Social Drivers of Health   Financial Resource Strain: Low Risk  (07/14/2023)   Received from [facility]   Overall Financial Resource Strain (CARDIA)    Difficulty of Paying Living Expenses: Not hard at all  Food Insecurity: No Food Insecurity (07/14/2023)   Received from [facility]   Hunger Vital Sign    Worried About Running Out of Food in the Last Year: Never true    Ran Out of Food in the Last Year: Never true  Transportation Needs: No Transportation Needs (07/14/2023)   Received from [facility] - Transportation    In the past 12 months, has lack of transportation kept you from medical appointments or from getting medications?: No    Lack of Transportation (Non-Medical): No  Physical Activity: Not on file  Stress: Not on file  Social Connections: Not on file    Additional Social History: No additional history  Allergies:  No Known Allergies  Metabolic Disorder Labs: Lab Results  Component Value Date   HGBA1C 5.3  08/03/2023   No results found for: "PROLACTIN" No results found for: "CHOL", "TRIG", "HDL", "CHOLHDL", "VLDL", "LDLCALC" Lab Results  Component Value Date   TSH 1.470 08/03/2023    Therapeutic Level Labs: No results found for: "LITHIUM" No results found for: "CBMZ" No results found for: "VALPROATE"  Current Medications: Current Outpatient Medications  Medication Sig Dispense Refill   levonorgestrel  (MIRENA ) 20 MCG/DAY IUD 1 each by Intrauterine route once.     busPIRone  (BUSPAR ) 10 MG tablet Take 1 tablet (10 mg total) by mouth 2 (two) times daily as needed. (Patient not taking: Reported on 09/14/2023) 30 tablet 0   rizatriptan (MAXALT) 10 MG tablet Take 10 mg by mouth as needed for migraine. (Patient not taking: Reported on  09/14/2023)     No current facility-administered medications for this visit.    Musculoskeletal: Strength & Muscle Tone: within normal limits Gait & Station: normal Patient leans: N/A  Psychiatric Specialty Exam: Review of Systems  Constitutional: Negative.   HENT: Negative.    Eyes: Negative.   Respiratory: Negative.    Cardiovascular: Negative.   Gastrointestinal: Negative.   Endocrine: Negative.   Genitourinary: Negative.   Musculoskeletal: Negative.   Skin: Negative.   Allergic/Immunologic: Negative.   Neurological: Negative.   Hematological: Negative.   Psychiatric/Behavioral:  Positive for dysphoric mood. The patient is nervous/anxious.     Blood pressure 118/74, pulse (!) 101, temperature 98.4 F (36.9 C), temperature source Temporal, height 5' 0.87" (1.546 m), weight 89 lb 12.8 oz (40.7 kg), last menstrual period 06/29/2022, SpO2 92%.Body mass index is 17.04 kg/m.  General Appearance: Well Groomed  Eye Contact:  Good  Speech:  Clear and Coherent  Volume:  Normal  Mood:  Anxious and Depressed  Affect:  Appropriate  Thought Process:  Coherent  Orientation:  Full (Time, Place, and Person)  Thought Content:  Logical  Suicidal Thoughts:  No  Homicidal Thoughts:  No  Memory:  Immediate;   Good Recent;   Good Remote;   Good  Judgement:  Good  Insight:  Good  Psychomotor Activity:  Normal  Concentration:  Concentration: Good and Attention Span: Good  Recall:  Good  Fund of Knowledge:Good  Language: Good  Akathisia:  No  Handed:  Right  AIMS (if indicated):    Assets:  Financial Resources/Insurance Housing Social Support Vocational/Educational  ADL's:  Intact  Cognition: WNL  Sleep:  Good   Screenings: AIMS    Flowsheet Row Admission (Discharged) from 06/17/2016 [facility]  AIMS Total Score 0      AUDIT    Flowsheet Row Admission (Discharged) from 06/17/2016 in [facility]  Alcohol Use Disorder  Identification Test Final Score (AUDIT) 0      GAD-7    Flowsheet Row Office Visit from 08/24/2023 in [facility]  Total GAD-7 Score 10      PHQ2-9    Flowsheet Row Office Visit from 08/24/2023 in [facility] Office Visit from 08/03/2023 in [facility]  PHQ-2 Total Score 2 2  PHQ-9 Total Score 9 11      Flowsheet Row ED from 05/02/2023 in [facility] Emergency Department at [facility] ED from 11/15/2022 in [facility] Emergency Department at [facility] ED from 10/17/2021 in [facility] Emergency Department at [facility]  C-SSRS RISK CATEGORY No Risk No Risk No Risk       Assessment and Plan:   Assessment - Diagnosis: Major depressive  disorder, recurrent episode, moderate (HCC) [F33.1]  2. Generalized anxiety disorder [F41.1]   - Progress: Baseline appointment - Risk Factors: Worsening symptoms, suicide risk  Plan - Medications:  Start fluvoxamine 25 mg once a day for depression.,  Patient has been educated on initial jitteriness, nervousness, headaches and has been asked to wait and call the clinic should it last longer than a week. - Psychotherapy: Patient declined therapy patient previously participated in therapy for depression with no success or improvement to depression/symptoms. By patient request, referral has been placed for Spravato treatments. - Education: Patient has been provided education on medication, side effects and adverse reactions, purpose.  Patient has also been educated on treatment resistant therapies for depression including TMS and Spravato.  Patient was provided education on therapy and the relationship between pharmacotherapy and therapy with moderate to severe depression.  Patient has also been educated to contact the clinic should her symptoms get worse before the next visit. - Follow-Up: Patient will follow up in 2 weeks - Referrals: Patient referred to  Spravato treatments, due to treatment resistant depression - Safety Planning:  The patient has been educated, if they should have suicidal thoughts with or without a plan to call 911, or go to the closest emergency department.  Pt verbalized understanding.  Pt denies firearms within the home.  Pt also agrees to call the clinic should they have worsening symptoms before the next appointment.      Patient/Guardian was advised Release of Information must be obtained prior to any record release in order to collaborate their care with an outside provider. Patient/Guardian was advised if they have not already done so to contact the registration department to sign all necessary forms in order for us  to release information regarding their care.   Consent: Patient/Guardian gives verbal consent for treatment and assignment of benefits for services provided during this visit. Patient/Guardian expressed understanding and agreed to proceed.   This office note has been dictated. This dictation was prepared using Air traffic controller. As a result, errors may occur. When identified, these errors have been corrected. While every attempt is made to correct errors during dictation, errors may still exist.   Arlana Labor, NP 4/29/202510:58 AM

## 2023-09-29 ENCOUNTER — Encounter: Payer: Self-pay | Admitting: Psychiatry

## 2023-09-29 ENCOUNTER — Ambulatory Visit (INDEPENDENT_AMBULATORY_CARE_PROVIDER_SITE_OTHER): Admitting: Psychiatry

## 2023-09-29 ENCOUNTER — Other Ambulatory Visit: Payer: Self-pay

## 2023-09-29 VITALS — Temp 98.4°F | Ht 60.09 in | Wt 90.8 lb

## 2023-09-29 DIAGNOSIS — F411 Generalized anxiety disorder: Secondary | ICD-10-CM | POA: Diagnosis not present

## 2023-09-29 DIAGNOSIS — F331 Major depressive disorder, recurrent, moderate: Secondary | ICD-10-CM | POA: Diagnosis not present

## 2023-09-29 NOTE — Progress Notes (Addendum)
 BH MD/PA/NP OP Progress Note  09/29/2023 11:40 AM Dawn Meza  MRN:  [id number]  Chief Complaint:  Chief Complaint  Patient presents with   Follow-up   HPI: 28 year old female presenting to [facility] for routine follow-up.  Patient comes in stating that she has not been taking the medication and was anxious about starting a new medication.  Patient reports that there has been no significant change within the last 2 weeks stating that "same old same old ".  Patient reports that she has had few anxious episodes regarding trying to take medications and which prevented her from taking it.  Patient was reeducated about the medication that we are starting at a lower dose to help tolerate side effects better and also that we are recommending for [facility] psych testing for her due to her med trial failures to help determine medication course.  Patient was in agreement with this and states she would like to have that done.  Patient also has been evaluated by Jim Motts for Spravato in which she is planning to participate in.  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  Patient reports that she still has plans of going to go back to school but does not know how to go about trying to get that done with her limited resources of time and money.  Patient reports that her family is supporting her but she still does not have the confidence that she will be able to complete these goals.  Patient also reports that her child is doing well at home and she is doing a good job of taking care of her but just requires more resources and finding care for the child.  Patient denies SI, HI, AVH.  Patient has been urged to try the medication this week and to contact the provider should she experience side effects.  Patient also educated that the medication is sedating so advised to take before bed to help promote  sleep.  Patient has been encouraged to try to target 8 hours of sleep with the biggest barrier being that she has been playing games past 10 PM every night in which part is pleasure and also for her work.  Patient agrees with this treatment plan.  She will start the medication this week.  Patient will then follow up in 2 weeks for medication management and symptoms management.   Visit Diagnosis:    ICD-10-CM   1. Major depressive disorder, recurrent episode, moderate (HCC)  F33.1     2. Generalized anxiety disorder  F41.1       Past Psychiatric History:  Previous Psych Hospitalizations:  - 2019, suicidal thoughts.   Outpatient treatment:  - Participated in therapy previously for depression with no success and stop therapy due to no improvement.   Medications Current: - Fluvoxamine  25mg  once a day at night  Next Steps: - Pt is waiting for Spravato scheduling, completed intake and qualified. - Evaluate Fluvoxamine  therapy, and adjust at next appointment.  -[employer] testing mailed to home.    Medication Trials: - Lexapro , side effects and poor response, 2019 -Prozac, side effects and  poor response, 2019 -Zoloft , side effects and poor response, 2019  Suicide & Violence: - Patient denies SI, HI, AVH.  Denies having a firearm in the home  Psychotherapy: - Currently not interested in psychotherapy, was previously  in therapy for 3 years with no success in improvement of symptoms.  Legal:  - Denies legal issues.  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   Uterine size date discrepancy pregnancy, second trimester 01/19/2018    Past Surgical History:   Procedure Laterality Date   APPENDECTOMY     ESOPHAGOGASTRODUODENOSCOPY (EGD) WITH PROPOFOL  N/A 07/26/2020   Procedure: ESOPHAGOGASTRODUODENOSCOPY (EGD) WITH PROPOFOL ;  Surgeon: Irby Mannan, MD;  Location: [facility] ENDOSCOPY;  Service: Endoscopy;  Laterality: N/A;    Family Psychiatric History: Patient reports mother with depression.   Family History:  Family History  Problem Relation Age of Onset   Hypertension Mother    Ovarian cancer Maternal Aunt 50   Heart disease Maternal Grandfather     Social History:  Social History   Socioeconomic History   Marital status: Single    Spouse name: Not on file   Number of children: 1   Years of education: Not on file   Highest education level: Associate degree: academic program  Occupational History   Not on file  Tobacco Use   Smoking status: Former    Types: Cigarettes   Smokeless tobacco: Never  Vaping Use   Vaping status: Every Day   Substances: Nicotine , Flavoring  Substance and Sexual Activity   Alcohol use: No   Drug use: Not Currently    Frequency: 7.0 times per week   Sexual activity: Yes    Partners: Male    Birth control/protection: I.U.D.  Other Topics Concern   Not on file  Social History Narrative   Not on file   Social Drivers of Health   Financial Resource Strain: Low Risk  (07/14/2023)   Received from [facility]   Overall Financial Resource Strain (CARDIA)    Difficulty of Paying Living Expenses: Not hard at all  Food Insecurity: No Food Insecurity (07/14/2023)   Received from [facility]   Hunger Vital Sign    Worried About Running Out of Food in the Last Year: Never true    Ran Out of Food in the Last Year: Never true  Transportation Needs: No Transportation Needs (07/14/2023)   Received from [facility] - Transportation    In the past 12 months, has lack of transportation kept you from medical appointments or from getting  medications?: No    Lack of Transportation (Non-Medical): No  Physical Activity: Not on file  Stress: Not on file  Social Connections: Not on file    Allergies: No Known Allergies  Metabolic Disorder Labs: Lab Results  Component Value Date   HGBA1C 5.3 08/03/2023   No results found for: "PROLACTIN" No results found for: "CHOL", "TRIG", "HDL", "CHOLHDL", "VLDL", "LDLCALC" Lab Results  Component Value Date   TSH 1.470 08/03/2023   TSH 1.133 11/15/2019    Therapeutic Level Labs: No results found for: "LITHIUM" No results found for: "VALPROATE" No results found for: "CBMZ"  Current Medications: Current Outpatient Medications  Medication Sig Dispense Refill   busPIRone  (BUSPAR ) 10 MG tablet Take 1 tablet (10 mg total) by mouth 2 (  two) times daily as needed. 30 tablet 0   fluvoxaMINE  (LUVOX ) 25 MG tablet Take 1 tablet (25 mg total) by mouth at bedtime. 30 tablet 0   levonorgestrel  (MIRENA ) 20 MCG/DAY IUD 1 each by Intrauterine route once.     rizatriptan (MAXALT) 10 MG tablet Take 10 mg by mouth as needed for migraine.     No current facility-administered medications for this visit.     Musculoskeletal: Strength & Muscle Tone: within normal limits Gait & Station: normal Patient leans: N/A  Psychiatric Specialty Exam: Review of Systems  Constitutional: Negative.   HENT: Negative.    Eyes: Negative.   Respiratory: Negative.    Cardiovascular: Negative.   Gastrointestinal: Negative.   Endocrine: Negative.   Genitourinary: Negative.   Musculoskeletal: Negative.   Skin: Negative.     Temperature 98.4 F (36.9 C), temperature source Temporal, height 5' 0.09" (1.526 m), weight 90 lb 12.8 oz (41.2 kg), last menstrual period 06/29/2022.Body mass index is 17.68 kg/m.  General Appearance: Well Groomed  Eye Contact:  Good  Speech:  Clear and Coherent  Volume:  Normal  Mood:  Depressed  Affect:  Depressed  Thought Process:  Coherent  Orientation:  Full (Time, Place,  and Person)  Thought Content: Logical   Suicidal Thoughts:  No  Homicidal Thoughts:  No  Memory:  Immediate;   Good Recent;   Good Remote;   Good  Judgement:  Good  Insight:  Good  Psychomotor Activity:  Normal  Concentration:  Concentration: Good and Attention Span: Good  Recall:  Good  Fund of Knowledge: Good  Language: Good  Akathisia:  No  Handed:  Right  AIMS (if indicated): not done  Assets:  Communication Skills Desire for Improvement Housing Vocational/Educational  ADL's:  Intact  Cognition: WNL  Sleep:  Good   Screenings: AIMS    Flowsheet Row Admission (Discharged) from 06/17/2016 in BE[facility]AVIORAL [facility]ALTH OBSERVATION UNIT  AIMS Total Score 0      AUDIT    Flowsheet Row Admission (Discharged) from 06/17/2016 in BEHAVIORAL HEALTH OBSERVATION UNIT  Alcohol Use Disorder Identification Test Final Score (AUDIT) 0      GAD-7    Flowsheet Row Office Visit from 08/24/2023 in [facility]  Total GAD-7 Score 10      PHQ2-9    Flowsheet Row Office Visit from 09/14/2023 in [facility] Office Visit from 08/24/2023 in [facility] Office Visit from 08/03/2023 in [facility]  PHQ-2 Total Score 4 2 2   PHQ-9 Total Score 16 9 11       Flowsheet Row Office Visit from 09/14/2023 in [facility] from 05/02/2023 in [facility] at [facility] from 11/15/2022 in [facility] Department at [facility]  C-SSRS RISK CATEGORY No Risk No Risk No Risk        Assessment and Plan:  Assessment - Diagnosis: Major depressive disorder, recurrent episode, moderate (HCC) [F33.1]  2. Generalized anxiety disorder [F41.1]   - Progress: Pt reports that she did not take medication because she was anxious about side effects. Pt reports no significant changes in her life. Pt is agreeing to try the  medication this week.  - Risk Factors: Worsening symptoms, suicide risk  Plan - Medications:  Start fluvoxamine  25 mg once a day for depression. Patient did not start and requested more education before starting,  Patient has been educated on initial  jitteriness, nervousness, headaches and has been asked to wait and call the clinic should it last longer than a week. - Psychotherapy: Patient declined therapy patient previously participated in therapy for depression with no success or improvement to depression/symptoms. [employer] testing FMLA, for work Patient is being considered for Spravato, [place].. - Education: Patient has been provided education on medication, side effects and adverse reactions, purpose.  Patient has also been educated on treatment resistant therapies for depression including TMS and Spravato.  Patient was provided education on therapy and the relationship between pharmacotherapy and therapy with moderate to severe depression.  Patient has also been educated to contact the clinic should her symptoms get worse before the next visit. - Follow-Up: Patient will follow up in 2 weeks - Referrals: Patient referred to [facility] testing for multiple failed trials. - Safety Planning:  The patient has been educated, if they should have suicidal thoughts with or without a plan to call 911, or go to the closest emergency department.  Pt verbalized understanding.  Pt denies firearms within the home.  Pt also agrees to call the clinic should they have worsening symptoms before the next appointment.     Patient/Guardian was advised Release of Information must be obtained prior to any record release in order to collaborate their care with an outside provider. Patient/Guardian was advised if they have not already done so to contact the registration department to sign all necessary forms in order for us  to release information regarding their care.   Consent: Patient/Guardian gives verbal  consent for treatment and assignment of benefits for services provided during this visit. Patient/Guardian expressed understanding and agreed to proceed.    Arlana Labor, NP 09/29/2023, 11:40 AM

## 2023-10-03 DIAGNOSIS — R002 Palpitations: Secondary | ICD-10-CM

## 2023-10-05 ENCOUNTER — Ambulatory Visit: Payer: Self-pay | Admitting: Pediatrics

## 2023-10-13 ENCOUNTER — Ambulatory Visit: Admitting: Psychiatry

## 2023-10-13 DIAGNOSIS — F332 Major depressive disorder, recurrent severe without psychotic features: Secondary | ICD-10-CM | POA: Diagnosis not present

## 2023-10-15 ENCOUNTER — Telehealth: Admitting: Psychiatry

## 2023-10-15 DIAGNOSIS — F411 Generalized anxiety disorder: Secondary | ICD-10-CM

## 2023-10-15 DIAGNOSIS — F331 Major depressive disorder, recurrent, moderate: Secondary | ICD-10-CM

## 2023-10-15 MED ORDER — BUPROPION HCL ER (XL) 150 MG PO TB24: 150.0000 mg | ORAL_TABLET | Freq: Every day | ORAL | 0 refills | Status: DC

## 2023-10-15 NOTE — Progress Notes (Addendum)
 BH MD/PA/NP OP Progress Note  10/15/2023 10:22 AM Dawn Meza  MRN:  [id number]  Chief Complaint: Routine follow-up  Virtual Visit via Video Note  I connected with Dawn Meza on 10/19/23 at 10:30 AM EDT by a video enabled telemedicine application and verified that I am speaking with the correct person using two identifiers.  Location: Patient:  Address  [address] [place] [address]   Provider: [facility]   I discussed the limitations of evaluation and management by telemedicine and the availability of in person appointments. The patient expressed understanding and agreed to proceed.    I discussed the assessment and treatment plan with the patient. The patient was provided an opportunity to ask questions and all were answered. The patient agreed with the plan and demonstrated an understanding of the instructions.   The patient was advised to call back or seek an in-person evaluation if the symptoms worsen or if the condition fails to improve as anticipated.  I provided 30 minutes of non-face-to-face time during this encounter.   Dawn Labor, NP    HPI: [phone] Female presenting to [facility] for routine follow-up. Pt presents with persistent depression with no significant changes in her life. She attempted to take Fluvox, and reports a better mood, but she was experiencing GI irritability with diarrhea, that was affecting her work and lifestyle. Pt reports that she stopped the medication on her own due to the side effects.  Pt reports that work is going well with no complications.  She also reports that there are no significant situational stressors currently.  She denies SI/HI/AVH.  Pt stated that a friend shared about Wellbutrin  and she would like to give it a try.  Pt will start Wellbutrin  XR 150mg  once daily for 2 weeks.  Pt was instructed to contact the provider if she experiences side effects before the next visit.  Pt is in agreement with treatment plan. Pt  with no questions or concerns. Pt with a followup of 2 weeks.   Visit Diagnosis:    ICD-10-CM   1. Major depressive disorder, recurrent episode, moderate (HCC)  F33.1 buPROPion  (WELLBUTRIN  XL) 150 MG 24 hr tablet    2. Generalized anxiety disorder  F41.1       Past Psychiatric History:  Previous Psych Hospitalizations:  - 2019, suicidal thoughts.   Outpatient treatment:  - Participated in therapy previously for depression with no success and stop therapy due to no improvement.   Medications Current: - Wellbutrin  XR 150mg  once daily for 2 weeks.    Next Steps: - Pt is waiting for Spravato scheduling, completed intake and qualified. - Evaluate Fluvoxamine  therapy, and adjust at next appointment.  -[employer] testing mailed to home.    Medication Trials: - Lexapro , side effects and poor response, 2019 -Prozac, side effects and  poor response, 2019 -Zoloft , side effects and poor response, 2019 -10/10/23 - Fluvox, causing diarrhea and gi irritability.    Suicide & Violence: - Patient denies SI, HI, AVH.  Denies having a firearm in the home   Psychotherapy: - Currently not interested in psychotherapy, was previously in therapy for 3 years with no success in improvement of symptoms.   Legal:  - Denies legal issues.  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   Uterine size date discrepancy pregnancy, second trimester 01/19/2018    Past Surgical History:  Procedure Laterality Date   APPENDECTOMY     ESOPHAGOGASTRODUODENOSCOPY (EGD) WITH PROPOFOL  N/A 07/26/2020   Procedure: ESOPHAGOGASTRODUODENOSCOPY (EGD) WITH PROPOFOL ;  Surgeon: Irby Mannan, MD;   Location: [facility] ENDOSCOPY;  Service: Endoscopy;  Laterality: N/A;    Family Psychiatric History: Patient reports mother with depression.   Family History:  Family History  Problem Relation Age of Onset   Hypertension Mother    Ovarian cancer Maternal Aunt 50   Heart disease Maternal Grandfather     Social History:  Social History   Socioeconomic History   Marital status: Single    Spouse name: Not on file   Number of children: 1   Years of education: Not on file   Highest education level: Associate degree: academic program  Occupational History   Not on file  Tobacco Use   Smoking status: Former    Types: Cigarettes   Smokeless tobacco: Never  Vaping Use   Vaping status: Every Day   Substances: Nicotine , Flavoring  Substance and Sexual Activity   Alcohol use: No   Drug use: Not Currently    Frequency: 7.0 times per week   Sexual activity: Yes    Partners: Male    Birth control/protection: I.U.D.  Other Topics Concern   Not on file  Social History Narrative   Not on file   Social Drivers of Health   Financial Resource Strain: Low Risk  (07/14/2023)   Received [facility]   Overall Financial Resource Strain (CARDIA)    Difficulty of Paying Living Expenses: Not hard at all  Food Insecurity: No Food Insecurity (07/14/2023)   Received from [facility]   Hunger Vital Sign    Worried About Running Out of Food in the Last Year: Never true    Ran Out of Food in the Last Year: Never true  Transportation Needs: No Transportation Needs (07/14/2023)   Received from [facility] - Transportation    In the past 12 months, has lack of transportation kept you from medical appointments or from getting medications?: No    Lack of Transportation (Non-Medical): No  Physical Activity: Not on file  Stress: Not on file  Social Connections: Not on file    Allergies: No Known Allergies  Metabolic Disorder  Labs: Lab Results  Component Value Date   HGBA1C 5.3 08/03/2023   No results found for: "PROLACTIN" No results found for: "CHOL", "TRIG", "HDL", "CHOLHDL", "VLDL", "LDLCALC" Lab Results  Component Value Date   TSH 1.470 08/03/2023   TSH 1.133 11/15/2019    Therapeutic Level Labs: No results found for: "LITHIUM" No results found for: "VALPROATE" No results found for: "CBMZ"  Current Medications: Current Outpatient Medications  Medication Sig Dispense Refill   busPIRone  (BUSPAR ) 10 MG tablet Take 1 tablet (10 mg total) by mouth 2 (two) times daily as needed. 30 tablet 0   fluvoxaMINE  (LUVOX ) 25 MG tablet Take 1 tablet (25 mg total) by mouth at bedtime. 30 tablet 0   levonorgestrel  (MIRENA ) 20 MCG/DAY IUD 1 each by Intrauterine route once.     rizatriptan (MAXALT) 10 MG tablet Take 10 mg by mouth as needed for migraine.     No  current facility-administered medications for this visit.     Musculoskeletal: Strength & Muscle Tone: within normal limits Gait & Station: normal Patient leans: N/A  Psychiatric Specialty Exam: Review of Systems  Constitutional: Negative.   HENT: Negative.    Eyes: Negative.   Respiratory: Negative.    Cardiovascular: Negative.   Gastrointestinal: Negative.   Endocrine: Negative.   Genitourinary: Negative.   Musculoskeletal: Negative.   Skin: Negative.   Allergic/Immunologic: Negative.   Neurological: Negative.   Hematological: Negative.   Psychiatric/Behavioral:  Positive for decreased concentration. The patient is nervous/anxious.     There were no vitals taken for this visit.There is no height or weight on file to calculate BMI.  General Appearance: Well Groomed  Eye Contact:  Good  Speech:  Clear and Coherent  Volume:  Normal  Mood:  Depressed  Affect:  Depressed  Thought Process:  Coherent  Orientation:  Full (Time, Place, and Person)  Thought Content: Logical   Suicidal Thoughts:  No  Homicidal Thoughts:  No  Memory:   Immediate;   Good Recent;   Good Remote;   Good  Judgement:  Good  Insight:  Good  Psychomotor Activity:  Normal  Concentration:  Concentration: Good and Attention Span: Good  Recall:  Good  Fund of Knowledge: Good  Language: Good  Akathisia:  No  Handed:  Right  AIMS (if indicated):   Assets:  Financial Resources/Insurance Housing Vocational/Educational  ADL's:  Intact  Cognition: WNL  Sleep:  Good   Screenings: AIMS    Flowsheet Row Admission (Discharged) from 06/17/2016 in BEHAVIORAL HEALTH OBSERVATION UNIT  AIMS Total Score 0      AUDIT    Flowsheet Row Admission (Discharged) from 06/17/2016 in BEHAVIORAL HEALTH OBSERVATION UNIT  Alcohol Use Disorder Identification Test Final Score (AUDIT) 0      GAD-7    Flowsheet Row Office Visit from 08/24/2023 in [facility]  Total GAD-7 Score 10      PHQ2-9    Flowsheet Row Office Visit from 09/29/2023 in [facility] Office Visit from 09/14/2023 in [facility] Office Visit from 08/24/2023 in [facility] Office Visit from 08/03/2023 in [facility]  PHQ-2 Total Score 5 4 2 2   PHQ-9 Total Score 17 16 9 11       Flowsheet Row Office Visit from 09/29/2023 in [facility] Office Visit from 09/14/2023 in [facility] ED from 05/02/2023 in [facility]  C-SSRS RISK CATEGORY No Risk No Risk No Risk        Assessment and Plan:  Assessment - Diagnosis: Major depressive disorder, recurrent episode, moderate (HCC) [F33.1]  2. Generalized anxiety disorder [F41.1]   - Progress: Pt reports she tried to take Fluvox but had significant GI irritability. Pt reports no significant changes in her life. Pt is agreeing to try the medication this week. Pt is waiting for  Spravato to get approved. - Risk Factors: Worsening symptoms, suicide risk  Plan - Medications:  Stop Fluvox, due to GI irritability.  Start Wellbutrin  XR 150mg , Pt has been educated of the initial nervousness, sweating, gi irritability of the medication. Pt encouraged to contact the provider is symptoms last longer than a week after starting the medication.  - Psychotherapy: Patient declined therapy patient previously participated in therapy for depression with no success or improvement to depression/symptoms. [employer] testing  FMLA, for work, pt reports she will work on gettting the paperwork.  Patient is being considered for Spravato, [place].. - Education: Patient has been provided education on medication, side effects and adverse reactions, purpose.  Patient has also been educated on treatment resistant therapies for depression including TMS and Spravato.  Patient was provided education on therapy and the relationship between pharmacotherapy and therapy with moderate to severe depression.  Patient has also been educated to contact the clinic should her symptoms get worse before the next visit. - Follow-Up: Patient will follow up in 2 weeks - Referrals: Patient referred to [facility] testing for multiple failed trials. - Safety Planning:  The patient has been educated, if they should have suicidal thoughts with or without a plan to call 911, or go to the closest emergency department.  Pt verbalized understanding.  Pt denies firearms within the home.  Pt also agrees to call the clinic should they have worsening symptoms before the next appointment.   Patient/Guardian was advised Release of Information must be obtained prior to any record release in order to collaborate their care with an outside provider. Patient/Guardian was advised if they have not already done so to contact the registration department to sign all necessary forms in order for us  to release information regarding their care.    Consent: Patient/Guardian gives verbal consent for treatment and assignment of benefits for services provided during this visit. Patient/Guardian expressed understanding and agreed to proceed.    Dawn Labor, NP 10/15/2023, 10:22 AM

## 2023-10-27 ENCOUNTER — Telehealth (INDEPENDENT_AMBULATORY_CARE_PROVIDER_SITE_OTHER): Admitting: Psychiatry

## 2023-10-27 DIAGNOSIS — F331 Major depressive disorder, recurrent, moderate: Secondary | ICD-10-CM

## 2023-10-27 DIAGNOSIS — F411 Generalized anxiety disorder: Secondary | ICD-10-CM | POA: Diagnosis not present

## 2023-10-27 NOTE — Progress Notes (Addendum)
 BH MD/PA/NP OP Progress Note  10/27/2023 9:30 AM Dawn Meza  MRN:  [id number]  Chief Complaint: Routine follow-up  Virtual Visit via Video Note  I connected with Dawn Meza on 10/27/23 at  9:30 AM EDT by a video enabled telemedicine application and verified that I am speaking with the correct person using two identifiers.  Location: Patient: [address]  [place] [address]  Provider: [facility]   I discussed the limitations of evaluation and management by telemedicine and the availability of in person appointments. The patient expressed understanding and agreed to proceed.    I discussed the assessment and treatment plan with the patient. The patient was provided an opportunity to ask questions and all were answered. The patient agreed with the plan and demonstrated an understanding of the instructions.   The patient was advised to call back or seek an in-person evaluation if the symptoms worsen or if the condition fails to improve as anticipated.  I provided 30 minutes of non-face-to-face time during this encounter.   Arlana Labor, NP    HPI: 28 year old female presents [facility] for follow-up.  Patient presents stating that she did not start Wellbutrin  stating that she did not feel the need for having to take the medication.  Patient reports that she does not feel any different but states she is is able to cope and get through the day without the medication.  Patient was asked about the pattern of her depression and asked if her depression gets worse 7 days before.  In which she is stated absolutely it does.  Patient reports that she has known this for some time and stated that her OB/GYN considered this several years ago but never investigated.  Patient was recommended to keep track of her symptoms as well as her menstrual cycle to see if there is a relationship.  Patient has also been advised of holding off of starting Wellbutrin  as she is stating she is able to  manage without the medication as she is soon to be going on vacation.  Patient reports she will be going to [place]  to visit family in which she will be gone for 8 days.  Patient is currently looking forward to that and stated that she could start the medication there in which the provider advised her not to start the medication and to just continue pursuing Spravato as monotherapy.  Patient reports that she is still waiting on completing paperwork for the Spravato in which she will start soon.  Patient with no other questions or concerns at this time patient denies SI, HI, AVH.  Patient is agreement with treatment plan and patient will follow up in 3 weeks.  After her vacation.  Visit Diagnosis:    ICD-10-CM   1. Major depressive disorder, recurrent episode, moderate (HCC)  F33.1     2. Generalized anxiety disorder  F41.1       Past Psychiatric History:  Previous Psych Hospitalizations:  - 2019, suicidal thoughts.   Outpatient treatment:  - Participated in therapy previously for depression with no success and stop therapy due to no improvement.   Medications Current: - No current psychotropic meds, Pt to pursue spravato monotherapy.    Next Steps: - Pt is waiting for Spravato scheduling, completed intake and qualified. - Evaluate Fluvoxamine  therapy, and adjust at next appointment.  -[facility] testing mailed to home.    Medication Trials: - Lexapro , side effects and poor response, 2019 -Prozac, side effects and  poor  response, 2019 -Zoloft , side effects and poor response, 2019 -10/10/23 - Fluvox, causing diarrhea and gi irritability.    Suicide & Violence: - Patient denies SI, HI, AVH.  Denies having a firearm in the home   Psychotherapy: - Currently not interested in psychotherapy, was previously in therapy for 3 years with no success in improvement of symptoms.   Legal:  - Denies legal issues.  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   Uterine size date discrepancy pregnancy, second trimester 01/19/2018    Past Surgical History:  Procedure Laterality Date   APPENDECTOMY     ESOPHAGOGASTRODUODENOSCOPY (EGD) WITH PROPOFOL  N/A 07/26/2020   Procedure: ESOPHAGOGASTRODUODENOSCOPY (EGD) WITH PROPOFOL ;  Surgeon: Irby Mannan, MD;  Location: [facility] ENDOSCOPY;  Service: Endoscopy;  Laterality: N/A;    Family Psychiatric History: No additional  Family History:  Family History  Problem Relation Age of Onset   Hypertension Mother    Ovarian cancer Maternal Aunt 50   Heart disease Maternal Grandfather     Social History:  Social History   Socioeconomic History   Marital status: Single    Spouse name: Not on file   Number of children: 1   Years of education: Not on file   Highest education level: Associate degree: academic program  Occupational History   Not on file  Tobacco Use   Smoking status: Former    Types: Cigarettes   Smokeless tobacco: Never  Vaping Use   Vaping status: Every Day   Substances: Nicotine , Flavoring  Substance and Sexual Activity   Alcohol use: No   Drug use: Not Currently    Frequency: 7.0 times per week   Sexual activity: Yes    Partners: Male    Birth control/protection: I.U.D.  Other Topics Concern   Not on file  Social History Narrative   Not on file   Social Drivers of Health   Financial Resource Strain: Low Risk  (07/14/2023)   Received from [facility]   Overall Financial Resource Strain (CARDIA)    Difficulty of Paying Living Expenses: Not hard at all  Food Insecurity: No Food Insecurity (07/14/2023)   Received from [facility]   Hunger Vital Sign     Worried About Running Out of Food in the Last Year: Never true    Ran Out of Food in the Last Year: Never true  Transportation Needs: No Transportation Needs (07/14/2023)   Received from [facility] - Transportation    In the past 12 months, has lack of transportation kept you from medical appointments or from getting medications?: No    Lack of Transportation (Non-Medical): No  Physical Activity: Not on file  Stress: Not on file  Social Connections: Not on file    Allergies: No Known Allergies  Metabolic Disorder Labs: Lab Results  Component Value Date   HGBA1C 5.3 08/03/2023   No results found for: PROLACTIN No results found for: CHOL, TRIG, HDL, CHOLHDL, VLDL, LDLCALC Lab Results  Component Value Date   TSH 1.470 08/03/2023   TSH 1.133 11/15/2019  Therapeutic Level Labs: No results found for: LITHIUM No results found for: VALPROATE No results found for: CBMZ  Current Medications: Current Outpatient Medications  Medication Sig Dispense Refill   buPROPion  (WELLBUTRIN  XL) 150 MG 24 hr tablet Take 1 tablet (150 mg total) by mouth daily. 14 tablet 0   levonorgestrel  (MIRENA ) 20 MCG/DAY IUD 1 each by Intrauterine route once.     rizatriptan (MAXALT) 10 MG tablet Take 10 mg by mouth as needed for migraine.     No current facility-administered medications for this visit.     Musculoskeletal: Strength & Muscle Tone: within normal limits Gait & Station: normal Patient leans: N/A  Psychiatric Specialty Exam: Review of Systems  Constitutional: Negative.   HENT: Negative.    Eyes: Negative.   Respiratory: Negative.    Cardiovascular: Negative.   Gastrointestinal: Negative.   Endocrine: Negative.   Genitourinary: Negative.   Musculoskeletal: Negative.   Skin: Negative.   Allergic/Immunologic: Negative.   Neurological: Negative.   Hematological: Negative.     There were no vitals taken for this visit.There is no  height or weight on file to calculate BMI.  General Appearance: Well Groomed  Eye Contact:  Good  Speech:  Clear and Coherent  Volume:  Normal  Mood:  Euthymic  Affect:  Appropriate  Thought Process:  Coherent  Orientation:  Full (Time, Place, and Person)  Thought Content: WDL   Suicidal Thoughts:  No  Homicidal Thoughts:  No  Memory:  Immediate;   Good Recent;   Good Remote;   Good  Judgement:  Good  Insight:  Good  Psychomotor Activity:  Normal  Concentration:  Concentration: Good  Recall:  Good  Fund of Knowledge: Good  Language: Good  Akathisia:  No  Handed:  Right  AIMS (if indicated):   Assets:  Desire for Improvement Financial Resources/Insurance Housing  ADL's:  Intact  Cognition: WNL  Sleep:  Good   Screenings: AIMS    Flowsheet Row Admission (Discharged) from 06/17/2016 in [facility]  AIMS Total Score 0      AUDIT    Flowsheet Row Admission (Discharged) from 06/17/2016 in [facility]  Alcohol Use Disorder Identification Test Final Score (AUDIT) 0      GAD-7    Flowsheet Row Video Visit from 10/15/2023 in [facility] Office Visit from 08/24/2023 in [facility]  Total GAD-7 Score 11 10      PHQ2-9    Flowsheet Row Video Visit from 10/15/2023 in [facility] Office Visit from 09/29/2023 in [facility] Office Visit from 09/14/2023 in [facility] Office Visit from 08/24/2023 in [facility] Office Visit from 08/03/2023 in [facility]  PHQ-2 Total Score 4 5 4 2 2   PHQ-9 Total Score 16 17 16 9 11       Flowsheet Row Office Visit from 09/29/2023 in [facility] Office Visit from 09/14/2023 in [facility] ED  from 05/02/2023 in [facility] at [facility]  C-SSRS RISK CATEGORY No Risk No Risk No Risk        Assessment and Plan:  Assessment - Diagnosis: Major depressive disorder, recurrent episode, moderate (HCC) [F33.1]  2. Generalized anxiety disorder [F41.1]   - Progress: Pt reports she not taking the wellbutrin  and waiting for Spravato appointment.  - Risk  Factors: Worsening symptoms, suicide risk  Plan - Medications:  Pt did not start Wellbutrin  stating she did not feel the need to take the medication.  2. Patient is being considered for Spravato, [place].. - Psychotherapy: Patient declined therapy patient previously participated in therapy for depression with no success or improvement to depression/symptoms. - Education: Patient has been provided education on medication, side effects and adverse reactions, purpose.  Patient has also been educated on treatment resistant therapies for depression including TMS and Spravato.  Patient was provided education on therapy and the relationship between pharmacotherapy and therapy with moderate to severe depression.  Patient has also been educated to contact the clinic should her symptoms get worse before the next visit. - Follow-Up: Patient will follow up in 2 weeks - Referrals: Patient referred to [facility] testing for multiple failed trials. - Safety Planning:  The patient has been educated, if they should have suicidal thoughts with or without a plan to call 911, or go to the closest emergency department.  Pt verbalized understanding.  Pt denies firearms within the home.  Pt also agrees to call the clinic should they have worsening symptoms before the next appointment.    Patient/Guardian was advised Release of Information must be obtained prior to any record release in order to collaborate their care with an outside provider. Patient/Guardian was advised if they have not already done so to contact the registration  department to sign all necessary forms in order for us  to release information regarding their care.   Consent: Patient/Guardian gives verbal consent for treatment and assignment of benefits for services provided during this visit. Patient/Guardian expressed understanding and agreed to proceed.    Arlana Labor, NP 10/27/2023, 9:30 AM

## 2023-11-01 ENCOUNTER — Telehealth: Admitting: Psychiatry

## 2023-11-12 ENCOUNTER — Telehealth (INDEPENDENT_AMBULATORY_CARE_PROVIDER_SITE_OTHER): Admitting: Psychiatry

## 2023-11-12 DIAGNOSIS — F411 Generalized anxiety disorder: Secondary | ICD-10-CM | POA: Diagnosis not present

## 2023-11-12 DIAGNOSIS — F331 Major depressive disorder, recurrent, moderate: Secondary | ICD-10-CM

## 2023-11-12 MED ORDER — BUPROPION HCL ER (XL) 150 MG PO TB24: 150.0000 mg | ORAL_TABLET | Freq: Every day | ORAL | 0 refills | Status: DC

## 2023-11-12 NOTE — Progress Notes (Signed)
 BH MD/PA/NP OP Progress Note  11/12/2023 10:58 AM Dawn Meza  MRN:  [id number]  Chief Complaint: Routine follow-up  Virtual Visit via Video Note  I connected with Dawn Meza on 11/12/23 at 11:00 AM EDT by a video enabled telemedicine application and verified that I am speaking with the correct person using two identifiers.  Location: Patient: [address]  [place] [address]  Provider: [facility] Provider   I discussed the limitations of evaluation and management by telemedicine and the availability of in person appointments. The patient expressed understanding and agreed to proceed.    I discussed the assessment and treatment plan with the patient. The patient was provided an opportunity to ask questions and all were answered. The patient agreed with the plan and demonstrated an understanding of the instructions.   The patient was advised to call back or seek an in-person evaluation if the symptoms worsen or if the condition fails to improve as anticipated.  I provided 30 minutes of non-face-to-face time during this encounter.   Dawn Jama Der, NP    HPI: 28 year old female presenting [facility] for follow-up.  Patient reports that she just open vacation in [place]  in which she gets to see family with her daughter and states she had a really good time.  Patient does reports stating that her depression has increased since returning and states that she is feeling well for the last week and states that she would be interested in medications again.  Patient reports that she would like to start Wellbutrin  150 mg as recommended initially once daily is in agreement to The medication today.  New prescription has been sent to the pharmacy.  Patient reports no update from Spravato treatment at home because she has not started this therapy.  Patient also reports that she is doing okay at work but she is still on light duty due to her back injury.  Patient reports that she has  been referred to physical therapy due to her persistent back pain.  Patient with no other complaints or concerns at this time patient reports that she can try the medication to see if it helps her mood.  Patient states that her meds are menstrual cycle is not correlating with her depression.  Patient recommended to start Wellbutrin  150 mg once daily.  Patient with no other questions or concerns at this time.  Patient agrees with treatment plan.  Patient denies SI, HI, AVH.  Patient will follow up in 1 month virtually. Visit Diagnosis:    ICD-10-CM   1. Generalized anxiety disorder  F41.1     2. Major depressive disorder, recurrent episode, moderate (HCC)  F33.1 buPROPion  (WELLBUTRIN  XL) 150 MG 24 hr tablet      Past Psychiatric History:  Previous Psych Hospitalizations:  - 2019, suicidal thoughts.   Outpatient treatment:  - Participated in therapy previously for depression with no success and stop therapy due to no improvement.   Medications Current: - Wellbutrin  150mg  once daily.    Next Steps: - Pt is waiting for Spravato scheduling, completed intake and qualified. - Evaluate Fluvoxamine  therapy, and adjust at next appointment.  -[facility] testing mailed to home.    Medication Trials: - Lexapro , side effects and poor response, 2019 -Prozac, side effects and  poor response, 2019 -Zoloft , side effects and poor response, 2019 -10/10/23 - Fluvox, causing diarrhea and gi irritability.    Suicide & Violence: - Patient denies SI, HI, AVH.  Denies having a firearm in  the home   Psychotherapy: - Currently not interested in psychotherapy, was previously in therapy for 3 years with no success in improvement of symptoms.   Legal:  - Denies legal issues.  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   Uterine size date discrepancy pregnancy, second trimester 01/19/2018    Past Surgical History:  Procedure Laterality Date   APPENDECTOMY     ESOPHAGOGASTRODUODENOSCOPY (EGD) WITH PROPOFOL  N/A 07/26/2020   Procedure: ESOPHAGOGASTRODUODENOSCOPY (EGD) WITH PROPOFOL ;  Surgeon: Janalyn Keene NOVAK, MD;  Location: [facility] ENDOSCOPY;  Service: Endoscopy;  Laterality: N/A;    Family Psychiatric History: No additional  Family History:  Family History  Problem Relation Age of Onset   Hypertension Mother    Ovarian cancer Maternal Aunt 50   Heart disease Maternal Grandfather     Social History:  Social History   Socioeconomic History   Marital status: Single    Spouse name: Not on file   Number of children: 1   Years of education: Not on file   Highest education level: Associate degree: academic program  Occupational History   Not on file  Tobacco Use   Smoking status: Former    Types: Cigarettes   Smokeless tobacco: Never  Vaping Use   Vaping status: Every Day   Substances: Nicotine , Flavoring  Substance and Sexual Activity   Alcohol use: No   Drug use: Not Currently    Frequency: 7.0 times per week   Sexual activity: Yes    Partners: Male    Birth control/protection: I.U.D.  Other Topics Concern   Not on file  Social History Narrative   Not on file   Social Drivers of Health   Financial Resource Strain: Low Risk  (07/14/2023)   Received from [facility]   Overall Financial Resource Strain (CARDIA)    Difficulty of Paying Living Expenses: Not hard at all  Food Insecurity: No Food Insecurity (07/14/2023)   Received from [facility]   Hunger Vital Sign    Within the past 12 months, you worried that your food would run out before you got the money to buy more.: Never true    Within the past 12 months, the  food you bought just didn't last and you didn't have money to get more.: Never true  Transportation Needs: No Transportation Needs (07/14/2023)   Received from [facility] - Transportation    In the past 12 months, has lack of transportation kept you from medical appointments or from getting medications?: No    Lack of Transportation (Non-Medical): No  Physical Activity: Not on file  Stress: Not on file  Social Connections: Not on file    Allergies: No Known Allergies  Metabolic Disorder Labs: Lab Results  Component Value Date   HGBA1C 5.3 08/03/2023   No results found for: PROLACTIN No results found for: CHOL, TRIG, HDL, CHOLHDL, VLDL, LDLCALC Lab Results  Component Value Date   TSH 1.470 08/03/2023   TSH 1.133 11/15/2019    Therapeutic Level Labs: No results found for: LITHIUM No results found for:  VALPROATE No results found for: CBMZ  Current Medications: Current Outpatient Medications  Medication Sig Dispense Refill   buPROPion  (WELLBUTRIN  XL) 150 MG 24 hr tablet Take 1 tablet (150 mg total) by mouth daily. 14 tablet 0   levonorgestrel  (MIRENA ) 20 MCG/DAY IUD 1 each by Intrauterine route once.     rizatriptan (MAXALT) 10 MG tablet Take 10 mg by mouth as needed for migraine.     No current facility-administered medications for this visit.     Musculoskeletal: Strength & Muscle Tone: within normal limits Gait & Station: normal Patient leans: N/A  Psychiatric Specialty Exam: Review of Systems  Constitutional: Negative.   HENT: Negative.    Eyes: Negative.   Respiratory: Negative.    Cardiovascular: Negative.   Gastrointestinal: Negative.   Endocrine: Negative.   Genitourinary: Negative.   Musculoskeletal:  Positive for back pain and gait problem.  Skin: Negative.   Allergic/Immunologic: Negative.   Hematological: Negative.   Psychiatric/Behavioral:  Positive for dysphoric mood.     There were no vitals  taken for this visit.There is no height or weight on file to calculate BMI.  General Appearance: Well Groomed  Eye Contact:  Good  Speech:  Clear and Coherent  Volume:  Normal  Mood:  Depressed  Affect:  Depressed  Thought Process:  Coherent  Orientation:  Full (Time, Place, and Person)  Thought Content: Logical   Suicidal Thoughts:  No  Homicidal Thoughts:  No  Memory:  Immediate;   Good Recent;   Good Remote;   Good  Judgement:  Good  Insight:  Good  Psychomotor Activity:  Normal  Concentration:  Concentration: Good and Attention Span: Good  Recall:  Good  Fund of Knowledge: Good  Language: Good  Akathisia:  No  Handed:  Right  AIMS (if indicated):   Assets:  Desire for Improvement Financial Resources/Insurance Housing  ADL's:  Intact  Cognition: WNL  Sleep:  Good   Screenings: AIMS    Flowsheet Row Admission (Discharged) from 06/17/2016 in [facility]  AIMS Total Score 0   AUDIT    Flowsheet Row Admission (Discharged) from 06/17/2016 in BEHAVIORAL HEALTH OBSERVATION UNIT  Alcohol Use Disorder Identification Test Final Score (AUDIT) 0   GAD-7    Flowsheet Row Video Visit from 10/27/2023 in [facility] Video Visit from 10/15/2023 in [facility] Office Visit from 08/24/2023 in [facility]  Total GAD-7 Score 9 11 10    PHQ2-9    Flowsheet Row Video Visit from 10/27/2023 in [facility] Video Visit from 10/15/2023 in [facility] Office Visit from 09/29/2023 in [facility] Office Visit from 09/14/2023 in [facility] Office Visit from 08/24/2023 in [facility]  PHQ-2 Total Score 3 4 5 4 2   PHQ-9 Total Score 9 16 17 16 9    Flowsheet Row Video Visit from 10/27/2023 in  [facility] Office Visit from 09/29/2023 in [facility] Office Visit from 09/14/2023 in [facility]  C-SSRS RISK CATEGORY No Risk No Risk No Risk     Assessment and Plan:  Assessment - Diagnosis: Major depressive disorder, recurrent episode, moderate (HCC) [F33.1]  2. Generalized anxiety disorder [F41.1]   - Progress: Pt reports coming back from vacation and stating she is feeling low and would like  to start the Wellbutrin  now.  - Risk Factors: Worsening symptoms, suicide risk  Plan - Medications:  Start Wellbutrin  150mg  once daily due to continued derpession, pt educated initial jitteriness, n/v, and irritability and to contact the clinic.  2. Patient is being considered for Spravato, [place]. - Psychotherapy: Patient declined therapy patient previously participated in therapy for depression with no success or improvement to depression/symptoms. - Education: Patient has been provided education on medication, side effects and adverse reactions, purpose.  Patient has also been educated on treatment resistant therapies for depression including TMS and Spravato.  Patient was provided education on therapy and the relationship between pharmacotherapy and therapy with moderate to severe depression.  Patient has also been educated to contact the clinic should her symptoms get worse before the next visit. - Follow-Up: Patient will follow up in 2 weeks - Referrals: Patient referred to [facility] testing for multiple failed trials. - Safety Planning:  The patient has been educated, if they should have suicidal thoughts with or without a plan to call 911, or go to the closest emergency department.  Pt verbalized understanding.  Pt denies firearms within the home.  Pt also agrees to call the clinic should they have worsening symptoms before the next appointment.  Patient/Guardian was  advised Release of Information must be obtained prior to any record release in order to collaborate their care with an outside provider. Patient/Guardian was advised if they have not already done so to contact the registration department to sign all necessary forms in order for us  to release information regarding their care.   Consent: Patient/Guardian gives verbal consent for treatment and assignment of benefits for services provided during this visit. Patient/Guardian expressed understanding and agreed to proceed.    Dawn Jama Der, NP 11/12/2023, 10:58 AM

## 2023-11-23 ENCOUNTER — Encounter: Payer: Self-pay | Admitting: Pediatrics

## 2023-11-23 ENCOUNTER — Ambulatory Visit: Admitting: Pediatrics

## 2023-11-23 VITALS — BP 100/69 | HR 77 | Temp 97.9°F | Wt 90.2 lb

## 2023-11-23 DIAGNOSIS — Z1330 Encounter for screening examination for mental health and behavioral disorders, unspecified: Secondary | ICD-10-CM

## 2023-11-23 DIAGNOSIS — F322 Major depressive disorder, single episode, severe without psychotic features: Secondary | ICD-10-CM | POA: Diagnosis not present

## 2023-11-23 DIAGNOSIS — R0789 Other chest pain: Secondary | ICD-10-CM

## 2023-11-23 MED ORDER — BUPROPION HCL ER (SR) 100 MG PO TB12: 100.0000 mg | ORAL_TABLET | Freq: Two times a day (BID) | ORAL | 2 refills | Status: DC

## 2023-11-23 NOTE — Progress Notes (Signed)
 Office Visit  BP 100/69   Pulse 77   Temp 97.9 F (36.6 C) (Oral)   Wt 90 lb 3.2 oz (40.9 kg)   SpO2 99%   BMI 17.57 kg/m    Subjective:    Patient ID: Dawn Meza, female    DOB: April 19, 1996, 28 y.o.   MRN: [id number]  HPI: Dawn Meza is a 28 y.o. female  Chief Complaint  Patient presents with   Chest Pain    On left side , dizziness for about 2 years     Discussed the use of AI scribe software for clinical note transcription with the patient, who gave verbal consent to proceed.  History of Present Illness   Dawn Meza is a 28 year old female with a history of anxiety and mitral valve regurgitation who presents with left-sided chest pain and dizziness.  She experiences left-sided chest pain described as internal, sometimes radiating to her arm, with varying sharp and dull characteristics. The pain is occasionally accompanied by tenderness and soreness, exacerbated by certain positions and deep breathing. These symptoms have persisted for several days, with similar episodes in the past leading to multiple hospital visits where Toradol  provided only temporary relief.  She also experiences dizziness and poor circulation, noting her feet often appear blue. She has palpitations, excessive sweating, and a feeling of shakiness, described as feeling 'off'. These symptoms have been more frequent in the past week or two.  She has a history of anxiety and has tried various medications, including fluvoxamine , which caused excessive diarrhea, and another medication that worsened her anxiety, causing jaw clenching and insomnia. She recently stopped taking Wellbutrin  due to insomnia and is currently not on any medication. While on Wellbutrin , her energy levels were good in the morning and afternoon, but she struggled with sleep at night.  She recalls a past echocardiogram showing slight mitral valve regurgitation. She has not been able to work out due to back issues. No smoking.         Relevant past medical, surgical, family and social history reviewed and updated as indicated. Interim medical history since our last visit reviewed. Allergies and medications reviewed and updated.  ROS per HPI unless specifically indicated above     Objective:    BP 100/69   Pulse 77   Temp 97.9 F (36.6 C) (Oral)   Wt 90 lb 3.2 oz (40.9 kg)   SpO2 99%   BMI 17.57 kg/m   Wt Readings from Last 3 Encounters:  11/23/23 90 lb 3.2 oz (40.9 kg)  09/29/23 90 lb 12.8 oz (41.2 kg)  09/14/23 89 lb 12.8 oz (40.7 kg)     Physical Exam Constitutional:      Appearance: Normal appearance.  HENT:     Head: Normocephalic and atraumatic.  Eyes:     Pupils: Pupils are equal, round, and reactive to light.  Cardiovascular:     Rate and Rhythm: Normal rate and regular rhythm.     Pulses: Normal pulses.     Heart sounds: Normal heart sounds.  Pulmonary:     Effort: Pulmonary effort is normal.     Breath sounds: Normal breath sounds.  Abdominal:     General: Abdomen is flat.     Palpations: Abdomen is soft.  Musculoskeletal:        General: Normal range of motion.     Cervical back: Normal range of motion.  Skin:    General: Skin is warm and dry.     Capillary  Refill: Capillary refill takes less than 2 seconds.  Neurological:     General: No focal deficit present.     Mental Status: She is alert. Mental status is at baseline.  Psychiatric:        Mood and Affect: Mood normal.        Behavior: Behavior normal.         11/23/2023   10:44 AM 10/27/2023    9:32 AM 10/15/2023   10:39 AM 09/29/2023   12:47 PM 09/14/2023   11:37 AM  Depression screen PHQ 2/9  Decreased Interest 1 2 3 3 2   Down, Depressed, Hopeless 1 1 1 2 2   PHQ - 2 Score 2 3 4 5 4   Altered sleeping 2 1 2 3 3   Tired, decreased energy 3 3 3 3 1   Change in appetite 1 0 0 1 2  Feeling bad or failure about yourself  1 0 3 1 3   Trouble concentrating 1 1 3 2 3   Moving slowly or fidgety/restless 1 1 1 2  0  Suicidal  thoughts 0 0 0 0 0  PHQ-9 Score 11 9 16 17 16   Difficult doing work/chores Very difficult   Very difficult Extremely dIfficult       11/23/2023   10:44 AM 10/27/2023    9:34 AM 10/15/2023   10:41 AM 08/24/2023   10:46 AM  GAD 7 : Generalized Anxiety Score  Nervous, Anxious, on Edge 3 2 1 1   Control/stop worrying 1 1 1 1   Worry too much - different things 1 1 2 2   Trouble relaxing 3 3 3 2   Restless 1 0 2 2  Easily annoyed or irritable 2 2 2 2   Afraid - awful might happen 0 0 0 0  Total GAD 7 Score 11 9 11 10   Anxiety Difficulty Very difficult Somewhat difficult Very difficult Very difficult   EKG 11/23/23: NSR to ST or T wave changes     Assessment & Plan:  Assessment & Plan   Atypical chest pain Persistent chest pain with dizziness, has had prior cardiac w/u with previous recommendation for ablation. She notes worsened and more frequent pain. EKG wnl in office. Recent holter wnl. Not reproducible w palpation on exam Suspect may be MSK related, ?pleuritis (unclear etiology). Patient requesting cardiology consult. If ongoing negative w/u can reassess.  - Order EKG to assess cardiac status. - If EKG normal, order chest X-ray for musculoskeletal or pulmonary evaluation. - Refer to cardiology for echocardiogram and further cardiac evaluation. -     EKG 12-Lead -     Ambulatory referral to Cardiology  MDD (major depressive disorder), single episode, severe , no psychosis (HCC) Following with psych. She did find the bup beneficial during the day but felt SE at night. Spravato eval pending. Recommend she discuss this further w psych. Sx above appear to be unrelated to med changes but will re-evaluate at future follow up. -     buPROPion  HCl ER (SR); Take 1 tablet (100 mg total) by mouth 2 (two) times daily.  Dispense: 60 tablet; Refill: 2  Encounter for behavioral health screening As part of their intake evaluation, the patient was screened for depression, anxiety.  PHQ9 SCORE 11, GAD7  SCORE 11. Screening results positive for tested conditions. See plan under problem/diagnosis above.   Follow up plan: Return in about 3 months (around 02/23/2024) for Chronic illness f/u.  Dawn SHAUNNA Nett, MD

## 2023-12-10 ENCOUNTER — Telehealth: Admitting: Psychiatry

## 2023-12-10 DIAGNOSIS — F331 Major depressive disorder, recurrent, moderate: Secondary | ICD-10-CM

## 2023-12-10 DIAGNOSIS — F411 Generalized anxiety disorder: Secondary | ICD-10-CM | POA: Diagnosis not present

## 2023-12-10 NOTE — Progress Notes (Signed)
 BH MD/PA/NP OP Progress Note  12/10/2023 11:01 AM Dawn Meza  MRN:  [id number]  Chief Complaint: Routine follow-up  Virtual Visit via Video Note  I connected with Dawn Meza on 12/10/23 at 11:00 AM EDT by a video enabled telemedicine application and verified that I am speaking with the correct person using two identifiers.  Location: Patient: [address]  [place] [address]  Provider: [facility] Office Provider   I discussed the limitations of evaluation and management by telemedicine and the availability of in person appointments. The patient expressed understanding and agreed to proceed.    I discussed the assessment and treatment plan with the patient. The patient was provided an opportunity to ask questions and all were answered. The patient agreed with the plan and demonstrated an understanding of the instructions.   The patient was advised to call back or seek an in-person evaluation if the symptoms worsen or if the condition fails to improve as anticipated.  I provided 30 minutes of non-face-to-face time during this encounter.   Dawn Jama Der, NP    HPI: 28 year old female presenting [facility] for follow-up.  Patient reports that she has been doing very well for the last several weeks but having issues of sleep due to starting Wellbutrin  150 mg ER once daily.  Patient reports that she has started developing clarity is not having depression and anxiety, with feeling more clarity.  Patient reports that she has been having issues with sleep stating that the anxiety and depression has been managed much better in the morning but states that at night she is unable to go to sleep due to the anxiety.  Patient did see her PCP in which the PCP decreased the medication down to 100 mg twice a day which the patient was reluctant to take the nighttime dose due to the reaction of her body at night.  Patient was in agreement with this provider and trying to 100 mg once daily as  patient has been known to be very sensitive to medications in which she is in agreement with stating that she has been very productive use last 2 weeks.  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  Patient reports that she did find another townhome that is significantly cheaper than her current living situation and that she is looking for 2 changes.  Patient is waiting on Spravato appointment again available and she is looking forward to the session stating that she just has to wait for the right resources to lined up so that she will have transportation to be able to participate in treatment.  Patient with no other questions or concerns at this time.  Patient is satisfied currently with the medication regimen and is delighted that Wellbutrin  is working without any of the significant side effects that she experienced without medication.  Consideration to GI irritability.  Patient denies SI, HI, AVH.  Patient will follow up in 2 weeks.  I personally spent a total of 30 minutes in the care of the patient today including preparing to see the patient, getting/reviewing separately obtained history, performing a medically appropriate exam/evaluation, counseling and educating, referring and communicating with other health care professionals,  and documenting clinical information in the EHR.  Visit Diagnosis:    ICD-10-CM   1. Major depressive disorder, recurrent episode, moderate (HCC)  F33.1     2. Generalized anxiety disorder  F41.1       Past Psychiatric History:  Previous Psych Hospitalizations:  - 2019, suicidal thoughts.   Outpatient treatment:  - Participated in therapy previously for depression with no success  and stop therapy due to no improvement.   Medications Current: - Wellbutrin  150mg  once daily.    Next Steps: - Pt is waiting for Spravato scheduling, completed intake and qualified. - Evaluate Fluvoxamine  therapy, and adjust at next appointment.  -Genesight testing mailed to home.    Medication Trials: - Lexapro , side effects and poor response, 2019 -Prozac, side effects and  poor response, 2019 -Zoloft , side effects and poor response, 2019 -10/10/23 - Fluvox, causing diarrhea and gi irritability.    Suicide & Violence: - Patient denies SI, HI, AVH.  Denies having a firearm in the home   Psychotherapy: - Currently not interested in psychotherapy, was previously in therapy for 3 years with no success in improvement of symptoms.   Legal:  - Denies legal issues.  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   Uterine size date discrepancy pregnancy, second trimester 01/19/2018    Past Surgical History:  Procedure Laterality Date   APPENDECTOMY     ESOPHAGOGASTRODUODENOSCOPY (EGD) WITH PROPOFOL  N/A 07/26/2020   Procedure: ESOPHAGOGASTRODUODENOSCOPY (EGD) WITH PROPOFOL ;  Surgeon: Janalyn Keene NOVAK, MD;  Location: [facility] ENDOSCOPY;  Service: Endoscopy;  Laterality: N/A;    Family Psychiatric History: No additional  Family History:  Family History  Problem Relation Age of Onset   Hypertension Mother    Ovarian cancer Maternal Aunt 50   Heart disease Maternal Grandfather     Social History:  Social History   Socioeconomic History   Marital status: Single    Spouse name: Not on file   Number of children: 1   Years of education: Not on file   Highest  education level: Associate degree: academic program  Occupational History   Not on file  Tobacco Use   Smoking status: Former    Types: Cigarettes   Smokeless tobacco: Never  Vaping Use   Vaping status: Every Day   Substances: Nicotine , Flavoring  Substance and Sexual Activity   Alcohol use: No   Drug use: Not Currently    Frequency: 7.0 times per week   Sexual activity: Yes    Partners: Male    Birth control/protection: I.U.D.  Other Topics Concern   Not on file  Social History Narrative   Not on file   Social Drivers of Health   Financial Resource Strain: Low Risk  (07/14/2023)   Received from [facility]   Overall Financial Resource Strain (CARDIA)    Difficulty of Paying Living Expenses: Not hard at all  Food Insecurity: No Food Insecurity (07/14/2023)   Received from [facility]   Hunger Vital Sign    Within the past 12 months, you worried that your food would run out before you  got the money to buy more.: Never true    Within the past 12 months, the food you bought just didn't last and you didn't have money to get more.: Never true  Transportation Needs: No Transportation Needs (07/14/2023)   Received from [facility] - Transportation    In the past 12 months, has lack of transportation kept you from medical appointments or from getting medications?: No    Lack of Transportation (Non-Medical): No  Physical Activity: Not on file  Stress: Not on file  Social Connections: Not on file    Allergies: No Known Allergies  Metabolic Disorder Labs: Lab Results  Component Value Date   HGBA1C 5.3 08/03/2023   No results found for: PROLACTIN No results found for: CHOL, TRIG, HDL, CHOLHDL, VLDL, LDLCALC Lab Results  Component Value Date   TSH 1.470 08/03/2023   TSH 1.133 11/15/2019    Therapeutic Level Labs: No results found for: LITHIUM No results found for: VALPROATE No results found  for: CBMZ  Current Medications: Current Outpatient Medications  Medication Sig Dispense Refill   buPROPion  ER (WELLBUTRIN  SR) 100 MG 12 hr tablet Take 1 tablet (100 mg total) by mouth 2 (two) times daily. 60 tablet 2   levonorgestrel  (MIRENA ) 20 MCG/DAY IUD 1 each by Intrauterine route once.     rizatriptan (MAXALT) 10 MG tablet Take 10 mg by mouth as needed for migraine.     No current facility-administered medications for this visit.     Musculoskeletal: Strength & Muscle Tone: within normal limits Gait & Station: normal Patient leans: N/A  Psychiatric Specialty Exam: Review of Systems  Constitutional: Negative.   HENT: Negative.    Eyes: Negative.   Respiratory: Negative.    Cardiovascular: Negative.   Gastrointestinal: Negative.   Endocrine: Negative.   Genitourinary: Negative.   Musculoskeletal: Negative.   Skin: Negative.   Allergic/Immunologic: Negative.   Neurological: Negative.   Hematological: Negative.     There were no vitals taken for this visit.There is no height or weight on file to calculate BMI.  General Appearance: Well Groomed  Eye Contact:  Good  Speech:  Clear and Coherent  Volume:  Normal  Mood:  Anxious and Depressed  Affect:  Appropriate  Thought Process:  Coherent  Orientation:  Full (Time, Place, and Person)  Thought Content: Logical   Suicidal Thoughts:  No  Homicidal Thoughts:  No  Memory:  Immediate;   Good Recent;   Good Remote;   Good  Judgement:  Good  Insight:  Good  Psychomotor Activity:  Normal  Concentration:  Concentration: Good and Attention Span: Good  Recall:  Good  Fund of Knowledge: Good  Language: Good  Akathisia:  No  Handed:  Right  AIMS (if indicated): not done  Assets:  Desire for Improvement Financial Resources/Insurance Housing  ADL's:  Intact  Cognition: WNL  Sleep:  Good   Screenings: AIMS    Flowsheet Row Admission (Discharged) from 06/17/2016 in [facility]  AIMS Total  Score 0   AUDIT    Flowsheet Row Admission (Discharged) from 06/17/2016 in [facility] UNIT  Alcohol Use Disorder Identification Test Final Score (AUDIT) 0   GAD-7    Flowsheet Row Office Visit from 11/23/2023 in [facility] Video Visit from 10/27/2023 in [facility] Video Visit from 10/15/2023 in [facility] Office Visit from 08/24/2023 in [facility]  Total GAD-7 Score 11 9 11 10    PHQ2-9    Flowsheet Row Office Visit from 11/23/2023 in [facility] Video Visit from 10/27/2023 in [facility] Video Visit from 10/15/2023 in [facility] Office Visit from 09/29/2023 in [facility] Office Visit from 09/14/2023 in [facility]  PHQ-2 Total Score 2 3 4 5 4   PHQ-9 Total Score 11 9 16 17 16    Flowsheet Row Video Visit from 10/27/2023 in [facility] Office Visit from 09/29/2023 in [facility] Office Visit from 09/14/2023 in [facility]  C-SSRS RISK CATEGORY No Risk No Risk No Risk     Assessment and Plan:  Assessment - Diagnosis: Major depressive disorder, recurrent episode, moderate (HCC) [F33.1]  2. Generalized anxiety disorder [F41.1]   - Progress: Pt reports coming back from vacation and stating she is feeling low and would like to start the Wellbutrin  now.  - Risk Factors: Worsening symptoms, suicide risk  Plan - Medications:  Start Wellbutrin  100mg  once daily due to continued derpession, pt educated initial jitteriness, n/v, and irritability and to contact the clinic.  2. Patient is being considered for Spravato, [place]. - Psychotherapy:  Patient declined therapy patient previously participated in therapy for depression with no success or improvement to depression/symptoms. - Education: Patient has been provided education on medication, side effects and adverse reactions, purpose.  Patient has also been educated on treatment resistant therapies for depression including TMS and Spravato.  Patient was provided education on therapy and the relationship between pharmacotherapy and therapy with moderate to severe depression.  Patient has also been educated to contact the clinic should her symptoms get worse before the next visit. - Follow-Up: Patient will follow up in 2 weeks - Referrals: No referrals - Safety Planning:  The patient has been educated, if they should have suicidal thoughts with or without a plan to call 911, or go to the closest emergency department.  Pt verbalized understanding.  Pt denies firearms within the home.  Pt also agrees to call the clinic should they have worsening symptoms before the next appointment.  Patient/Guardian was advised Release of Information must be obtained prior to any record release in order to collaborate their care with an outside provider. Patient/Guardian was advised if they have not already done so to contact the registration department to sign all necessary forms in order for us  to release information regarding their care.   Consent: Patient/Guardian gives verbal consent for treatment and assignment of benefits for services provided during this visit. Patient/Guardian expressed understanding and agreed to proceed.    Dawn Jama Der, NP 12/10/2023, 11:01 AM

## 2023-12-24 ENCOUNTER — Telehealth: Admitting: Psychiatry

## 2024-01-18 NOTE — Progress Notes (Unsigned)
 Cardiology Office Note   Date:  01/19/2024  ID:  Dawn Meza, DOB 1996-02-06, MRN [id number] PCP: Herold Hadassah SQUIBB, MD  [facility] Providers Cardiologist:  Caron Poser, MD     History of Present Illness Dawn Meza is a 29 y.o. female no significant PMH who presents for further evaluation and management of palpitations and dizziness.  Patient reports a long history of orthostatic dizziness and palpitations.  She has been to several different cardiologists and even electrophysiologist regarding the symptoms.  She has been told that she might have POTS, or she might have SVT.  She has reportedly had a normal ETT and echo in the past.  She denies any syncope, but does report that the symptoms interfere with her daily life.  She has tried several beta-blockers as well as ivabradine which tended to bottom out her blood pressure and make everything worse.  She would prefer not to do any medications at this point.  She is understandably frustrated and just seeking diagnosis.  Relevant CVD History -Zio monitor 09/2023 with rare PACs/PVCs, mean heart rate 87, and no arrhythmia   ROS: Pt denies any chest discomfort, jaw pain, arm pain, syncope, presyncope, orthopnea, PND, or LE edema.  Studies Reviewed I have independently reviewed the patient's ECG, recent bloodwork, and prior medical records.  Physical Exam VS:  BP 104/68 (BP Location: Right Arm, Patient Position: Sitting, Cuff Size: Small)   Pulse 76   Ht 5' (1.524 m)   Wt 89 lb 3.2 oz (40.5 kg)   SpO2 98%   BMI 17.42 kg/m   Orthostatic VS for the past 24 hrs (Last 3 readings):  BP- Lying Pulse- Lying BP- Sitting Pulse- Sitting BP- Standing at 0 minutes Pulse- Standing at 0 minutes BP- Standing at 3 minutes Pulse- Standing at 3 minutes  01/19/24 0928 109/70 76 113/77 72 131/79 109 111/78 109  01/19/24 0925 100/62 76 113/77 72 131/79 109 -- --      Wt Readings from Last 3 Encounters:  01/19/24 89 lb 3.2 oz (40.5 kg)  11/23/23  90 lb 3.2 oz (40.9 kg)  08/24/23 89 lb 3.2 oz (40.5 kg)    GEN: No acute distress. NECK: No JVD; No carotid bruits. CARDIAC: RRR, no murmurs, rubs, gallops. RESPIRATORY:  Clear to auscultation. EXTREMITIES:  Warm and well-perfused. No edema.  ASSESSMENT AND PLAN POTS Dysautonomia Palpitations Lightheadedness Patient presents with classic symptoms of POTS including positional tachycardia/dizziness.  She has reportedly had normal echocardiograms in the past as well as a normal ETT.  She recently had a monitor that did not show any evidence of arrhythmia. No syncope. We obtain orthostatic vital sign's today in the office which showed an increase in heart rate of over 40 bpm without hypotension.  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  Plan: - Repeat echo to ensure that there is no structural cause of her symptoms - Plan for the standard POTS treatments; counterpressure maneuvers with standing, graduated exercise program, compression stockings, etc. - I advised that she start taking salt tabs starting low and gradually uptitrating to see if that helps her symptoms; that is really the only treatment she has not tried - Will hold off on any pharmacologic therapy as she has tried multiple beta-blockers and even ivabradine without relief in the past (this actually caused hypotension for her and made things worse) - Advised that she call the [facility] clinic to inquire on further treatment availability  Dispo: RTC 6 months  Signed, Caron Poser, MD

## 2024-01-19 ENCOUNTER — Ambulatory Visit

## 2024-01-19 VITALS — BP 104/68 | HR 76 | Ht 60.0 in | Wt 89.2 lb

## 2024-01-19 DIAGNOSIS — G90A Postural orthostatic tachycardia syndrome (POTS): Secondary | ICD-10-CM | POA: Diagnosis not present

## 2024-01-19 DIAGNOSIS — R002 Palpitations: Secondary | ICD-10-CM | POA: Insufficient documentation

## 2024-01-19 DIAGNOSIS — R42 Dizziness and giddiness: Secondary | ICD-10-CM | POA: Diagnosis not present

## 2024-01-19 DIAGNOSIS — G901 Familial dysautonomia [Riley-Day]: Secondary | ICD-10-CM | POA: Diagnosis not present

## 2024-01-19 NOTE — Patient Instructions (Addendum)
 Medication Instructions:   Your physician recommends that you continue on your current medications as directed. Please refer to the Current Medication list given to you today.    *If you need a refill on your cardiac medications before your next appointment, please call your pharmacy*  Lab Work:  No labs ordered today   If you have labs (blood work) drawn today and your tests are completely normal, you will receive your results only by: MyChart Message (if you have MyChart) OR A paper copy in the mail If you have any lab test that is abnormal or we need to change your treatment, we will call you to review the results.  Testing/Procedures:  Your physician has requested that you have an echocardiogram. Echocardiography is a painless test that uses sound waves to create images of your heart. It provides your doctor with information about the size and shape of your heart and how well your heart's chambers and valves are working.   You may receive an ultrasound enhancing agent through an IV if needed to better visualize your heart during the echo. This procedure takes approximately one hour.  There are no restrictions for this procedure.  This will take place at [address] [facility], [place] [address]  Please note: We ask at that you not bring children with you during ultrasound (echo/ vascular) testing. Due to room size and safety concerns, children are not allowed in the ultrasound rooms during exams. Our front office staff cannot provide observation of children in our lobby area while testing is being conducted. An adult accompanying a patient to their appointment will only be allowed in the ultrasound room at the discretion of the ultrasound technician under special circumstances. We apologize for any inconvenience.   Follow-Up: At [facility], you and your health needs are our priority.  As part of our continuing mission to provide you with exceptional  heart care, our providers are all part of one team.  This team includes your primary Cardiologist (physician) and Advanced Practice Providers or APPs (Physician Assistants and Nurse Practitioners) who all work together to provide you with the care you need, when you need it.  Your next appointment:   6 month(s)  Provider:   You may see Caron Poser, MD or one of the following Advanced Practice Providers on your designated Care Team:   Lonni Meager, NP Lesley Maffucci, PA-C Bernardino Bring, PA-C Cadence [place], PA-C Tylene Lunch, NP Barnie Hila, NP    We recommend signing up for the patient portal called MyChart.  Sign up information is provided on this After Visit Summary.  MyChart is used to connect with patients for Virtual Visits (Telemedicine).  Patients are able to view lab/test results, encounter notes, upcoming appointments, etc.  Non-urgent messages can be sent to your provider as well.   To learn more about what you can do with MyChart, go to [email or web address].        Guidelines for Patients with POTS  DIETARY MEASURES: The initial treatment strategy includes making sure to get adequate hydration and sodium. - Oral fluid intake should be encouraged to a target of 3 L daily (100oz). - In addition to water, you can choose drinks higher in sodium. Fluids with extra salt include tomato juice, tomato soup or warm broth. Drinks high in electrolytes, such as low calorie G2T, Powerade ZeroT, and PropelT, can also be helpful but be careful to avoid excess sugar. Make water your number one drink. - Eating  smaller meals more often, and avoiding large heavy meals, may be helpful. - Stimulants such as coffee, tea, energy drinks, certain fizzy drinks and alcohol may make symptoms worse, as can refined sugar, carbohydrates and dairy products - keep track of your triggers. - Aim for a daily salt intake of 8-10g of sodium chloride . You can do this in the form of added salt to food,  intake of higher sodium foods, and sports drinks with sodium - Don't over-rely on processed foods. Processed foods are easy to prepare and are appealing when you have reduced energy, but usually have less nutritional value. Beneficial salty snacks may include chicken or beef broth, vegetable broth, pickles, olives, salted fish like sardines/anchovies and nuts. Don't over-rely on snack chips and crackers for salt. - If you are having a hard time getting your salt in by foods, talk to your healthcare provider about sodium tablets. - Diet with high fiber and complex carbohydrates may help reduce blood glucose (sugar) spikes and lessen POTS symptoms. - Drink water and have something salty about 30 minutes before getting out of bed in the morning, if your dizziness is worse in the morning.  COMPRESSION STOCKINGS - Thigh high or waist high compression stockings can be helpful to alleviate the symptoms - seek out ones that are 30-29mmHg on the label. - Some people have tried athletic knee high compression stockings (can be purchased  online or from sporting good stores). These often come in more colors/patterns. Although not as strong as medical compression stockings, these can be effective in helping with symptoms in some people.  EXERCISE -  Studies have shown that one of the most important things a patient with POTS can do is exercise. Many patients with POTS are physically deconditioned and others are at risk for deconditioning by restricting their physical activity to manage POTS symptoms. - Start very slowly, exercise 5 minutes twice per day. Every week increase your exercise time by 3 minutes. Most patients with POTS feel better doing exercises that are recumbent (sitting down, such as using a stationary bike, rowing machine, or swimming).  - Gradually work your way up to a regular exercise program and incorporate walking. - Remember to stay hydrated and cool during your work-out. - Yoga and pilates  have also suggested to be helpful.  REGULAR MOVEMENT - Isometric exercises involve contracting your muscles without actually moving your body. Isometrics squeeze the muscle and push the blood back toward the heart. They are simple to do and can be done lying in bed or seated in a comfortable chair. It's a good idea to do these in bed before getting up to prepare your body for sitting and standing. - Transition slowly with your body. Go from lying to sitting on the edge of the bed. Stay there for several minutes, allowing the body to naturally adjust to the change in position. Once you are standing, pause and wait before walking to allow blood pressure to adjust again. If you feel lightheaded at any point, wait for a few minutes in that position to see if it resolves. If not, then return to the prior position as your body isn't adjusting properly. SLOWLY is the key.  SLEEP - Try to maintain a typical sleep schedule. Go to bed consistently at a certain time and set a consistent time to wake up. The best sleep hygiene and good rest comes from staying consistent with your sleep schedule every day.  - Most people need 7 to 10 hours sleep  at night. - You can try raising the head of your bed 6-10 inches to help alleviate POTS symptoms. The entire bed must be at an angle. Raising the head of the bed will reduce urine formation overnight and increase fluid volume in your circulation in the morning.   BIRTH CONTROL MEDICATIONS - Certain specific birth control pills may exacerbate symptoms - this includes medicines like Yaz or Yasmin that specifically contain drosperinone as the progestin ingredient. Do not stop your medication without talking to the prescriber.   HELPFUL EXERCISE PROGRAM Microsoft Word - [email or web address]_POTS_Exercise_Program.docx  POTSIE APP POTSie - An App for POTS Syndrome

## 2024-01-24 ENCOUNTER — Ambulatory Visit: Admitting: Advanced Practice Midwife

## 2024-01-24 ENCOUNTER — Encounter: Payer: Self-pay | Admitting: Advanced Practice Midwife

## 2024-01-24 VITALS — BP 121/78 | HR 101 | Ht 60.0 in | Wt 90.5 lb

## 2024-01-24 DIAGNOSIS — N3090 Cystitis, unspecified without hematuria: Secondary | ICD-10-CM

## 2024-01-24 DIAGNOSIS — R399 Unspecified symptoms and signs involving the genitourinary system: Secondary | ICD-10-CM

## 2024-01-24 DIAGNOSIS — Z30431 Encounter for routine checking of intrauterine contraceptive device: Secondary | ICD-10-CM

## 2024-01-24 LAB — POCT URINALYSIS DIPSTICK
Nitrite, UA: POSITIVE
Spec Grav, UA: <=1.005 — AB (ref 1.010–1.025)
pH, UA: 7.5 (ref 5.0–8.0)

## 2024-01-24 MED ORDER — NITROFURANTOIN MONOHYD MACRO 100 MG PO CAPS: 100.0000 mg | ORAL_CAPSULE | Freq: Two times a day (BID) | ORAL | 1 refills | Status: DC

## 2024-01-24 NOTE — Progress Notes (Signed)
 Patient ID: Dawn Meza, female   DOB: September 15, 1995, 28 y.o.   MRN: [id number]  Reason for Visit: IUD check and Dysuria   Subjective:  HPI:  Dawn Meza is a 28 y.o. female IUD check, Mirena  was placed 12/18/22. Patient wants to check to make sure strings are in place since she cannot feel them. Reports her partner felt the strings painfully- no laceration. She usually has a light period every couple of months. Reassurance given that IUD is likely in correct position. She is happy with this method of birth control.  Patient reports that when she woke up this morning she had a strong urinary odor and dysuria. She took Pyridium  which she had left from a prescription last year. She has a history of E. Coli UTI. She prefers to start antibiotics.  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   Uterine size date discrepancy pregnancy, second trimester 01/19/2018   Family History  Problem Relation Age of Onset   Hyperlipidemia Mother    Hypertension Mother    Ovarian cancer Maternal Aunt 50   Heart failure Maternal Grandmother    Heart failure Maternal Grandfather    Heart disease Maternal Grandfather    Past Surgical History:  Procedure Laterality Date   APPENDECTOMY     ESOPHAGOGASTRODUODENOSCOPY (EGD) WITH PROPOFOL  N/A 07/26/2020   Procedure: ESOPHAGOGASTRODUODENOSCOPY (EGD) WITH PROPOFOL ;  Surgeon: Janalyn Keene NOVAK, MD;  Location: [facility] ENDOSCOPY;  Service: Endoscopy;  Laterality: N/A;    Short Social History:  Social History   Tobacco Use   Smoking status: Former    Types: Cigarettes   Smokeless tobacco: Never  Substance Use Topics   Alcohol use: No    No Known  Allergies  Current Outpatient Medications  Medication Sig Dispense Refill   buPROPion  ER (WELLBUTRIN  SR) 100 MG 12 hr tablet Take 1 tablet (100 mg total) by mouth 2 (two) times daily. 60 tablet 2   levonorgestrel  (MIRENA ) 20 MCG/DAY IUD 1 each by Intrauterine route once.     rizatriptan (MAXALT) 10 MG tablet Take 10 mg by mouth as needed for migraine.     No current facility-administered medications for this visit.    Review of Systems  Constitutional:  Negative for chills and fever.  HENT:  Negative for congestion, ear discharge, ear pain, hearing loss, sinus pain and sore throat.   Eyes:  Negative for blurred vision and double vision.  Respiratory:  Negative for cough, shortness of breath and wheezing.   Cardiovascular:  Negative for chest pain, palpitations and leg swelling.  Gastrointestinal:  Negative for abdominal pain, blood in stool, constipation, diarrhea, heartburn, melena, nausea and vomiting.  Genitourinary:  Positive for dysuria. Negative for flank pain, frequency, hematuria and urgency.  Musculoskeletal:  Negative for back pain, joint pain and myalgias.  Skin:  Negative for itching and rash.  Neurological:  Negative for dizziness, tingling, tremors, sensory change, speech change, focal weakness, seizures, loss of consciousness, weakness and headaches.  Endo/Heme/Allergies:  Negative for environmental allergies. Does not bruise/bleed easily.  Psychiatric/Behavioral:  Negative for depression, hallucinations, memory loss, substance abuse and suicidal ideas. The patient is not nervous/anxious and  does not have insomnia.         Objective:  Objective   Vitals:   01/24/24 1607  BP: 121/78  Pulse: (!) 101  Weight: 90 lb 8 oz (41.1 kg)  Height: 5' (1.524 m)   Body mass index is 17.67 kg/m.  Constitutional: thin female in no acute distress.  HEENT: normal Skin: Warm and dry.  Cardiovascular: Regular rate and rhythm.   Respiratory:  Normal respiratory effort Back: no  CVAT Psych: Alert and Oriented x3. No memory deficits. Normal mood and affect.    Pelvic exam:  is not limited by body habitus EGBUS: within normal limits Vagina: within normal limits and with normal mucosa  Cervix: normal appearance, IUD strings visible 4 mm outside of Os, resting against cervix   Assessment/Plan:     28 y.o. G1 P0 female IUD string check, possible UTI  Urine Culture Rx Macrobid  Follow up as needed   Slater Rains, CNM [facility] 01/24/2024 5:00 PM

## 2024-01-25 LAB — URINALYSIS, ROUTINE W REFLEX MICROSCOPIC
Bilirubin, UA: NEGATIVE
Glucose, UA: NEGATIVE
Ketones, UA: NEGATIVE
Nitrite, UA: POSITIVE — AB
Protein,UA: NEGATIVE
RBC, UA: NEGATIVE
Specific Gravity, UA: 1.009 (ref 1.005–1.030)
Urobilinogen, Ur: 1 mg/dL (ref 0.2–1.0)
pH, UA: 7.5 (ref 5.0–7.5)

## 2024-01-25 LAB — MICROSCOPIC EXAMINATION: Casts: NONE SEEN /LPF

## 2024-01-27 LAB — URINE CULTURE

## 2024-01-28 ENCOUNTER — Telehealth: Admitting: Psychiatry

## 2024-01-30 ENCOUNTER — Ambulatory Visit: Payer: Self-pay | Admitting: Advanced Practice Midwife

## 2024-02-02 DIAGNOSIS — F332 Major depressive disorder, recurrent severe without psychotic features: Secondary | ICD-10-CM | POA: Diagnosis not present

## 2024-02-04 DIAGNOSIS — F332 Major depressive disorder, recurrent severe without psychotic features: Secondary | ICD-10-CM | POA: Diagnosis not present

## 2024-02-08 DIAGNOSIS — F332 Major depressive disorder, recurrent severe without psychotic features: Secondary | ICD-10-CM | POA: Diagnosis not present

## 2024-02-11 DIAGNOSIS — F332 Major depressive disorder, recurrent severe without psychotic features: Secondary | ICD-10-CM | POA: Diagnosis not present

## 2024-02-15 DIAGNOSIS — F332 Major depressive disorder, recurrent severe without psychotic features: Secondary | ICD-10-CM | POA: Diagnosis not present

## 2024-02-16 ENCOUNTER — Encounter: Payer: Self-pay | Admitting: Psychiatry

## 2024-02-16 ENCOUNTER — Ambulatory Visit (INDEPENDENT_AMBULATORY_CARE_PROVIDER_SITE_OTHER): Admitting: Psychiatry

## 2024-02-16 ENCOUNTER — Other Ambulatory Visit: Payer: Self-pay

## 2024-02-16 VITALS — BP 106/75 | HR 99 | Temp 97.8°F | Ht 60.0 in | Wt 88.8 lb

## 2024-02-16 DIAGNOSIS — F331 Major depressive disorder, recurrent, moderate: Secondary | ICD-10-CM

## 2024-02-16 DIAGNOSIS — F411 Generalized anxiety disorder: Secondary | ICD-10-CM | POA: Diagnosis not present

## 2024-02-16 NOTE — Progress Notes (Signed)
 BH MD/PA/NP OP Progress Note  02/16/2024 9:34 AM Dawn Meza  MRN:  [id number]  Chief Complaint:  Chief Complaint  Patient presents with   Follow-up   HPI: 28 year old female presenting [facility] for follow-up.  Patient reports that she is doing well stating that she is still having anxiety but states that she is not clear of the reaction but states that she was diagnosed with POTS recently and could be related to that.  Patient reports she is doing well on Wellbutrin  SR 100 mg once daily as well as Spravato therapy with [facility].  Patient reports she is getting 2 treatments a week and states she is soon going to move to 1 treatment a week.  Patient reports great improvements and starts provide states she is completely satisfied with her current medication regimen and with Spravato.  Patient has been advised to follow the [facility]'s recommendation regarding Spravato and states that she had there are no changes recommended at this time.  Patient reports satisfaction states that life stressors are improving with finding a housing that is $500 cheaper here in [place].  Patient reports her partner who is a new boyfriend of 2 months has moved over from [facility] and states that she is happy to receive his support and states that she is satisfied currently with the relationship and reports that she is feeling much better.  Patient with no other questions or concerns at this time.  Patient is in agreement with treatment plan.  Patient denies SI, HI, AVH.  Patient to follow-up in 3 months.  Visit Diagnosis:    ICD-10-CM   1. Major depressive disorder, recurrent episode, moderate (HCC)  F33.1     2. Generalized anxiety disorder  F41.1       Past Psychiatric History:  Previous Psych Hospitalizations:  - 2019, suicidal thoughts.   Outpatient treatment:  - Participated in therapy previously for depression with no success and stop therapy due to no improvement.   Medications Current: - Wellbutrin   100mg  once daily.  - Spravato treatment at [facility].   Next Steps: - Pt is waiting for Spravato scheduling, completed intake and qualified. - Evaluate Fluvoxamine  therapy, and adjust at next appointment.  -Genesight testing mailed to home.    Medication Trials: - Lexapro , side effects and poor response, 2019 -Prozac, side effects and  poor response, 2019 -Zoloft , side effects and poor response, 2019 -10/10/23 - Fluvox, causing diarrhea and gi irritability.    Suicide & Violence: - Patient denies SI, HI, AVH.  Denies having a firearm in the home   Psychotherapy: - Currently not interested in psychotherapy, was previously in therapy for 3 years with no success in improvement of symptoms.   Legal:  - Denies legal issues.  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   Uterine size date discrepancy pregnancy, second trimester 01/19/2018    Past Surgical History:  Procedure Laterality Date   APPENDECTOMY     ESOPHAGOGASTRODUODENOSCOPY (EGD) WITH PROPOFOL  N/A 07/26/2020   Procedure: ESOPHAGOGASTRODUODENOSCOPY (EGD) WITH PROPOFOL ;  Surgeon: Janalyn Keene NOVAK, MD;  Location: [facility] ENDOSCOPY;  Service: Endoscopy;  Laterality: N/A;  Family Psychiatric History: No additional  Family History:  Family History  Problem Relation Age of Onset   Hyperlipidemia Mother    Hypertension Mother    Ovarian cancer Maternal Aunt 50   Heart failure Maternal Grandmother    Heart failure Maternal Grandfather    Heart disease Maternal Grandfather     Social History:  Social History   Socioeconomic History   Marital status: Single    Spouse name: Not on file   Number of  children: 1   Years of education: Not on file   Highest education level: Associate degree: academic program  Occupational History   Not on file  Tobacco Use   Smoking status: Former    Types: Cigarettes   Smokeless tobacco: Never  Vaping Use   Vaping status: Every Day   Substances: Nicotine , Flavoring  Substance and Sexual Activity   Alcohol use: No   Drug use: Not Currently    Frequency: 7.0 times per week   Sexual activity: Yes    Partners: Male    Birth control/protection: I.U.D.  Other Topics Concern   Not on file  Social History Narrative   Not on file   Social Drivers of Health   Financial Resource Strain: Low Risk  (07/14/2023)   Received from [facility]   Overall Financial Resource Strain (CARDIA)    Difficulty of Paying Living Expenses: Not hard at all  Food Insecurity: No Food Insecurity (07/14/2023)   Received from [facility]   Hunger Vital Sign    Within the past 12 months, you worried that your food would run out before you got the money to buy more.: Never true    Within the past 12 months, the food you bought just didn't last and you didn't have money to get more.: Never true  Transportation Needs: No Transportation Needs (07/14/2023)   Received from [facility] - Transportation    In the past 12 months, has lack of transportation kept you from medical appointments or from getting medications?: No    Lack of Transportation (Non-Medical): No  Physical Activity: Not on file  Stress: Not on file  Social Connections: Not on file    Allergies: No Known Allergies  Metabolic Disorder Labs: Lab Results  Component Value Date   HGBA1C 5.3 08/03/2023   No results found for: PROLACTIN No results found for: CHOL, TRIG, HDL, CHOLHDL, VLDL, LDLCALC Lab Results  Component Value Date   TSH 1.470 08/03/2023   TSH 1.133 11/15/2019    Therapeutic Level Labs: No results found for:  LITHIUM No results found for: VALPROATE No results found for: CBMZ  Current Medications: Current Outpatient Medications  Medication Sig Dispense Refill   buPROPion  ER (WELLBUTRIN  SR) 100 MG 12 hr tablet Take 1 tablet (100 mg total) by mouth 2 (two) times daily. 60 tablet 2   levonorgestrel  (MIRENA ) 20 MCG/DAY IUD 1 each by Intrauterine route once.     nitrofurantoin , macrocrystal-monohydrate, (MACROBID ) 100 MG capsule Take 1 capsule (100 mg total) by mouth 2 (two) times daily. 14 capsule 1   rizatriptan (MAXALT) 10 MG tablet Take 10 mg by mouth as needed for migraine.     No current facility-administered medications for this visit.   Blood pressure 106/75, pulse 99, temperature 97.8 F (36.6 C), temperature source Temporal, height 5' (1.524 m), weight 88 lb 12.8 oz (40.3 kg).Body mass index is 17.34 kg/m.  Musculoskeletal: Strength & Muscle Tone: within normal  limits Gait & Station: normal Patient leans: N/A   Psychiatric Specialty Exam: Review of Systems  Constitutional: Negative.   HENT: Negative.    Eyes: Negative.   Respiratory: Negative.    Cardiovascular: Negative.   Gastrointestinal: Negative.   Endocrine: Negative.   Genitourinary: Negative.   Musculoskeletal: Negative.   Skin: Negative.   Allergic/Immunologic: Negative.   Neurological: Negative.   Hematological: Negative.     Blood pressure 106/75, pulse 99, temperature 97.8 F (36.6 C), temperature source Temporal, height 5' (1.524 m), weight 88 lb 12.8 oz (40.3 kg).Body mass index is 17.34 kg/m.  General Appearance: Well Groomed  Eye Contact:  Good  Speech:  Clear and Coherent  Volume:  Normal  Mood:  Anxious and Depressed  Affect:  Appropriate  Thought Process:  Coherent  Orientation:  Full (Time, Place, and Person)  Thought Content: Logical   Suicidal Thoughts:  No  Homicidal Thoughts:  No  Memory:  Immediate;   Good Recent;   Good Remote;   Good  Judgement:  Good  Insight:  Good   Psychomotor Activity:  Normal  Concentration:  Concentration: Good and Attention Span: Good  Recall:  Good  Fund of Knowledge: Good  Language: Good  Akathisia:  No  Handed:  Right  AIMS (if indicated): not done  Assets:  Desire for Improvement Financial Resources/Insurance Housing  ADL's:  Intact  Cognition: WNL  Sleep:  Good   Screenings: AIMS    Flowsheet Row Admission (Discharged) from 06/17/2016 in [facility]  AIMS Total Score 0   AUDIT    Flowsheet Row Admission (Discharged) from 06/17/2016 in BE[facility]AVIORAL HEALTH OBSERVATION UNIT  Alcohol Use Disorder Identification Test Final Score (AUDIT) 0   GAD-7    Flowsheet Row Office Visit from 11/23/2023 in [facility] Video Visit from 10/27/2023 in [facility] Video Visit from 10/15/2023 in [facility] Office Visit from 08/24/2023 in [facility]  Total GAD-7 Score 11 9 11 10    PHQ2-9    Flowsheet Row Office Visit from 11/23/2023 in [facility] Video Visit from 10/27/2023 in [facility] Video Visit from 10/15/2023 in [facility] Office Visit from 09/29/2023 in [facility] Office Visit from 09/14/2023 in [facility]  PHQ-2 Total Score 2 3 4 5 4   PHQ-9 Total Score 11 9 16 17 16    Flowsheet Row Video Visit from 10/27/2023 in [facility] Office Visit from 09/29/2023 in [facility] Office Visit from 09/14/2023 in [facility]  C-SSRS RISK CATEGORY No Risk No Risk No Risk     Assessment and Plan:  Assessment - Diagnosis: Major depressive disorder, recurrent episode, moderate (HCC) [F33.1]   2. Generalized anxiety disorder [F41.1]   - Progress: Pt reports coming back from vacation and stating she is feeling low and would like to start the Wellbutrin  now.  - Risk Factors: Worsening symptoms, suicide risk  Plan - Medications:  Start Wellbutrin  SR 100mg  once daily due to continued derpession, pt educated initial jitteriness, n/v, and irritability and to contact the clinic.  Hydroxyzine  12.5mg  TID as needed for anxiety.  2. Patient is being considered for Spravato, [facility]. - Psychotherapy: Patient declined therapy patient previously participated in therapy for depression with no success or improvement to depression/symptoms. -  Education: Patient has been provided education on medication, side effects and adverse reactions, purpose.  Patient has also been educated on treatment resistant therapies for depression including TMS and Spravato.  Patient was provided education on therapy and the relationship between pharmacotherapy and therapy with moderate to severe depression.  Patient has also been educated to contact the clinic should her symptoms get worse before the next visit. - Follow-Up: Pt to follow-up in 3 months.  - Referrals: No referrals - Safety Planning:  The patient has been educated, if they should have suicidal thoughts with or without a plan to call 911, or go to the closest emergency department.  Pt verbalized understanding.  Pt denies firearms within the home.  Pt also agrees to call the clinic should they have worsening symptoms before the next appointment.   Patient/Guardian was advised Release of Information must be obtained prior to any record release in order to collaborate their care with an outside provider. Patient/Guardian was advised if they have not already done so to contact the registration department to sign all necessary forms in order for us  to release information regarding their care.   Consent: Patient/Guardian gives verbal consent for treatment and  assignment of benefits for services provided during this visit. Patient/Guardian expressed understanding and agreed to proceed.    [name], NP 02/16/2024, 9:34 AM

## 2024-02-22 DIAGNOSIS — F332 Major depressive disorder, recurrent severe without psychotic features: Secondary | ICD-10-CM | POA: Diagnosis not present

## 2024-02-23 ENCOUNTER — Ambulatory Visit: Admitting: Pediatrics

## 2024-02-23 ENCOUNTER — Encounter: Payer: Self-pay | Admitting: Pediatrics

## 2024-02-23 VITALS — BP 102/64 | HR 74 | Temp 97.7°F | Ht 60.0 in | Wt 91.0 lb

## 2024-02-23 DIAGNOSIS — B379 Candidiasis, unspecified: Secondary | ICD-10-CM | POA: Diagnosis not present

## 2024-02-23 DIAGNOSIS — M546 Pain in thoracic spine: Secondary | ICD-10-CM

## 2024-02-23 DIAGNOSIS — L0390 Cellulitis, unspecified: Secondary | ICD-10-CM | POA: Diagnosis not present

## 2024-02-23 DIAGNOSIS — T3695XA Adverse effect of unspecified systemic antibiotic, initial encounter: Secondary | ICD-10-CM | POA: Diagnosis not present

## 2024-02-23 MED ORDER — BACLOFEN 5 MG PO TABS: 5.0000 mg | ORAL_TABLET | Freq: Two times a day (BID) | ORAL | 0 refills | Status: DC | PRN

## 2024-02-23 MED ORDER — MELOXICAM 15 MG PO TABS: 15.0000 mg | ORAL_TABLET | Freq: Every day | ORAL | 0 refills | Status: DC

## 2024-02-23 MED ORDER — DOXYCYCLINE HYCLATE 100 MG PO TABS: 100.0000 mg | ORAL_TABLET | Freq: Two times a day (BID) | ORAL | 0 refills | Status: AC

## 2024-02-23 MED ORDER — FLUCONAZOLE 150 MG PO TABS: 150.0000 mg | ORAL_TABLET | Freq: Once | ORAL | 0 refills | Status: AC

## 2024-02-23 NOTE — Progress Notes (Signed)
 Office Visit  BP 102/64   Pulse 74   Temp 97.7 F (36.5 C) (Oral)   Ht 5' (1.524 m)   Wt 91 lb (41.3 kg)   SpO2 98%   BMI 17.77 kg/m    Subjective:    Patient ID: Dawn Meza, female    DOB: [birth year], 28 y.o.   MRN: [id number]  HPI: Dawn Meza is a 28 y.o. female  Chief Complaint  Patient presents with   Depression   Back Pain    Patient states she had an injury to her back in June and was on workers comp. States she is no longer on this because her back was getting better. States she has been experiencing the back pains every other day and just wanted to mention it.    Breast Problem    Patient states she has been experiencing breast tenderness in both of her breast. States she also thinks her nipple piercing in her R breast may be infected. States it does drain pus on occasion.     Discussed the use of AI scribe software for clinical note transcription with the patient, who gave verbal consent to proceed.  History of Present Illness   Dawn Meza is a 28 year old female who presents with a breast infection and back pain.  She has a breast infection related to a nipple ring, which started after accidentally tearing the ring on a bralette. The area became infected, with the site reopening and forming a keloid-like bump. Pus is discharged when squeezed.  She experiences ongoing back pain since a work-related injury in June. The pain is dull and achy, located in the mid-back between the shoulder blades, and occurs every other day. It becomes severe enough to cause shallow breathing during flare-ups. No imaging has been done. Initial treatment included a heat pack, which was ineffective, and a muscle relaxant, which she only took once due to experiencing a deep slumber and vivid dreams. Naproxen  has been prescribed and helped, but she is cautious about frequent use due to potential kidney issues. She has not tried Voltaren gel or other muscle relaxants recently. She has a  history of seeing a chiropractor and attending physical therapy, where she learned stretching exercises but did not maintain them. Her back 'pops' and 'crunches' with certain movements, indicating ongoing issues.  She is currently undergoing ketamine therapy for psychiatric treatment, which she finds beneficial. The therapy schedule is twice a week for six weeks, then once a week for another six weeks, and finally once a month. She continues to take Wellbutrin  for energy.      Relevant past medical, surgical, family and social history reviewed and updated as indicated. Interim medical history since our last visit reviewed. Allergies and medications reviewed and updated.  ROS per HPI unless specifically indicated above     Objective:    BP 102/64   Pulse 74   Temp 97.7 F (36.5 C) (Oral)   Ht 5' (1.524 m)   Wt 91 lb (41.3 kg)   SpO2 98%   BMI 17.77 kg/m   Wt Readings from Last 3 Encounters:  02/23/24 91 lb (41.3 kg)  01/24/24 90 lb 8 oz (41.1 kg)  01/19/24 89 lb 3.2 oz (40.5 kg)     Physical Exam Exam conducted with a chaperone present.  Constitutional:      Appearance: Normal appearance.  Pulmonary:     Effort: Pulmonary effort is normal.  Chest:  Breasts:    Right: No  swelling, bleeding, inverted nipple, mass or nipple discharge.  Musculoskeletal:        General: Normal range of motion.  Skin:    Comments: Normal skin color  Neurological:     General: No focal deficit present.     Mental Status: She is alert. Mental status is at baseline.  Psychiatric:        Mood and Affect: Mood normal.        Behavior: Behavior normal.        Thought Content: Thought content normal.         02/23/2024   10:49 AM 11/23/2023   10:44 AM 10/27/2023    9:32 AM 10/15/2023   10:39 AM 09/29/2023   12:47 PM  Depression screen PHQ 2/9  Decreased Interest 2 1     Down, Depressed, Hopeless 2 1     PHQ - 2 Score 4 2     Altered sleeping 0 2     Tired, decreased energy 2 3     Change  in appetite 0 1     Feeling bad or failure about yourself  1 1     Trouble concentrating 1 1     Moving slowly or fidgety/restless 1 1     Suicidal thoughts 0 0     PHQ-9 Score 9 11     Difficult doing work/chores Very difficult Very difficult        Information is confidential and restricted. Go to Review Flowsheets to unlock data.       02/23/2024   10:51 AM 11/23/2023   10:44 AM 10/27/2023    9:34 AM 10/15/2023   10:41 AM  GAD 7 : Generalized Anxiety Score  Nervous, Anxious, on Edge 3 3    Control/stop worrying 1 1    Worry too much - different things 2 1    Trouble relaxing 3 3    Restless 1 1    Easily annoyed or irritable 2 2    Afraid - awful might happen 1 0    Total GAD 7 Score 13 11    Anxiety Difficulty Very difficult Very difficult       Information is confidential and restricted. Go to Review Flowsheets to unlock data.       Assessment & Plan:  Assessment & Plan   Thoracic back pain, unspecified back pain laterality, unspecified chronicity Chronic mid-back pain post work-related injury. Pain is dull, achy, with severe episodes.  Meloxicam considered for short-term use. Baclofen considered for muscle spasms. - Order x-ray of the back to assess structural issues. - Prescribe meloxicam for short-term use on significant pain days. - Prescribe baclofen at lowest dose for muscle spasms. - Provide directions to imaging center for x-ray.  -     DG Thoracic Spine W/Swimmers; Future -     Meloxicam; Take 1 tablet (15 mg total) by mouth daily.  Dispense: 30 tablet; Refill: 0 -     Baclofen; Take 1 tablet (5 mg total) by mouth 2 (two) times daily as needed for muscle spasms.  Dispense: 30 tablet; Refill: 0  Cellulitis, unspecified cellulitis site Infection likely due to trauma from accidental tearing. Recurrent reopening, pus discharge, and keloid-like bump noted. Advised against removing the piercing to prevent trapping infection. - Examine nipple piercing site for  infection and keloid formation. -     Doxycycline Hyclate; Take 1 tablet (100 mg total) by mouth 2 (two) times daily for 5 days.  Dispense: 10 tablet; Refill:  0  Antibiotic-induced yeast infection After completing tx above. -     Fluconazole ; Take 1 tablet (150 mg total) by mouth once for 1 dose. Take after completing antibiotics.  Dispense: 1 tablet; Refill: 0   Follow up plan: Return in about 6 weeks (around 04/05/2024) for back pain.  Hadassah SHAUNNA Nett, MD

## 2024-02-23 NOTE — Patient Instructions (Addendum)
 [facility] [address] [place],  [place]  [address]  For the anti inflammatory: take 1 a day for days that hurt (meloxicam 15mg )  I sent baclofen 5mg  to use as needed max 3 a day for muscle spasms/tightness  For you breast, leave the piercing in and let's try 5 days of doxycycline. I also sent diflucan  150mg  (1 dose) for after in case you get a yeast infection

## 2024-02-29 ENCOUNTER — Encounter: Payer: Self-pay | Admitting: Pediatrics

## 2024-02-29 ENCOUNTER — Other Ambulatory Visit

## 2024-02-29 DIAGNOSIS — F332 Major depressive disorder, recurrent severe without psychotic features: Secondary | ICD-10-CM | POA: Diagnosis not present

## 2024-03-03 DIAGNOSIS — F332 Major depressive disorder, recurrent severe without psychotic features: Secondary | ICD-10-CM | POA: Diagnosis not present

## 2024-03-07 DIAGNOSIS — F332 Major depressive disorder, recurrent severe without psychotic features: Secondary | ICD-10-CM | POA: Diagnosis not present

## 2024-03-08 DIAGNOSIS — F332 Major depressive disorder, recurrent severe without psychotic features: Secondary | ICD-10-CM | POA: Diagnosis not present

## 2024-03-21 DIAGNOSIS — F332 Major depressive disorder, recurrent severe without psychotic features: Secondary | ICD-10-CM | POA: Diagnosis not present

## 2024-03-25 ENCOUNTER — Other Ambulatory Visit: Payer: Self-pay

## 2024-03-25 ENCOUNTER — Emergency Department
Admission: EM | Admit: 2024-03-25 | Discharge: 2024-03-25 | Disposition: A | Discharge status: Home / Self Care | Attending: Emergency Medicine | Admitting: Emergency Medicine

## 2024-03-25 ENCOUNTER — Emergency Department

## 2024-03-25 ENCOUNTER — Encounter: Payer: Self-pay | Admitting: Emergency Medicine

## 2024-03-25 DIAGNOSIS — G43009 Migraine without aura, not intractable, without status migrainosus: Secondary | ICD-10-CM | POA: Insufficient documentation

## 2024-03-25 DIAGNOSIS — R0789 Other chest pain: Secondary | ICD-10-CM | POA: Insufficient documentation

## 2024-03-25 DIAGNOSIS — J45909 Unspecified asthma, uncomplicated: Secondary | ICD-10-CM | POA: Diagnosis not present

## 2024-03-25 DIAGNOSIS — R519 Headache, unspecified: Secondary | ICD-10-CM | POA: Diagnosis not present

## 2024-03-25 DIAGNOSIS — R0781 Pleurodynia: Secondary | ICD-10-CM | POA: Diagnosis not present

## 2024-03-25 LAB — COMPREHENSIVE METABOLIC PANEL WITH GFR
ALT: 12 U/L (ref 0–44)
AST: 17 U/L (ref 15–41)
Albumin: 4 g/dL (ref 3.5–5.0)
Alkaline Phosphatase: 41 U/L (ref 38–126)
Anion gap: 11 (ref 5–15)
BUN: 16 mg/dL (ref 6–20)
CO2: 23 mmol/L (ref 22–32)
Calcium: 8.7 mg/dL — ABNORMAL LOW (ref 8.9–10.3)
Chloride: 106 mmol/L (ref 98–111)
Creatinine, Ser: 0.79 mg/dL (ref 0.44–1.00)
GFR, Estimated: >60 mL/min (ref >60)
Glucose, Bld: 94 mg/dL (ref 70–99)
Potassium: 3.6 mmol/L (ref 3.5–5.1)
Sodium: 140 mmol/L (ref 135–145)
Total Bilirubin: 0.5 mg/dL (ref 0.0–1.2)
Total Protein: 7.1 g/dL (ref 6.5–8.1)

## 2024-03-25 LAB — CBC
HCT: 38.2 % (ref 36.0–46.0)
Hemoglobin: 12.5 g/dL (ref 12.0–15.0)
MCH: 28.3 pg (ref 26.0–34.0)
MCHC: 32.7 g/dL (ref 30.0–36.0)
MCV: 86.4 fL (ref 80.0–100.0)
Platelets: 280 K/uL (ref 150–400)
RBC: 4.42 MIL/uL (ref 3.87–5.11)
RDW: 12.1 % (ref 11.5–15.5)
WBC: 7.6 K/uL (ref 4.0–10.5)
nRBC: 0 % (ref 0.0–0.2)

## 2024-03-25 LAB — TROPONIN I (HIGH SENSITIVITY): Troponin I (High Sensitivity): <2 ng/L (ref <18)

## 2024-03-25 LAB — POC URINE PREG, ED: Preg Test, Ur: NEGATIVE

## 2024-03-25 MED ORDER — METHOCARBAMOL 500 MG PO TABS: 500.0000 mg | ORAL_TABLET | Freq: Three times a day (TID) | ORAL | 0 refills | Status: DC | PRN

## 2024-03-25 MED ORDER — KETOROLAC TROMETHAMINE 15 MG/ML IJ SOLN
15.0000 mg | Freq: Once | INTRAMUSCULAR | Status: AC
  Administered 2024-03-25: 15 mg via INTRAVENOUS
  Filled 2024-03-25: qty 1

## 2024-03-25 MED ORDER — DIPHENHYDRAMINE HCL 25 MG PO CAPS
50.0000 mg | ORAL_CAPSULE | Freq: Once | ORAL | Status: AC
  Administered 2024-03-25: 50 mg via ORAL
  Filled 2024-03-25: qty 2

## 2024-03-25 MED ORDER — PROMETHAZINE HCL 25 MG PO TABS
25.0000 mg | ORAL_TABLET | Freq: Once | ORAL | Status: AC
  Administered 2024-03-25: 25 mg via ORAL
  Filled 2024-03-25: qty 1

## 2024-03-25 MED ORDER — DIPHENHYDRAMINE HCL 50 MG/ML IJ SOLN
25.0000 mg | Freq: Once | INTRAMUSCULAR | Status: DC
  Filled 2024-03-25: qty 1

## 2024-03-25 MED ORDER — ACETAMINOPHEN 500 MG PO TABS
1000.0000 mg | ORAL_TABLET | Freq: Once | ORAL | Status: AC
  Administered 2024-03-25: 1000 mg via ORAL
  Filled 2024-03-25: qty 2

## 2024-03-25 MED ORDER — SODIUM CHLORIDE 0.9 % IV BOLUS
1000.0000 mL | Freq: Once | INTRAVENOUS | Status: AC
  Administered 2024-03-25: 1000 mL via INTRAVENOUS

## 2024-03-25 MED ORDER — SODIUM CHLORIDE 0.9 % IV SOLN
12.5000 mg | Freq: Four times a day (QID) | INTRAVENOUS | Status: DC | PRN
  Filled 2024-03-25: qty 0.5

## 2024-03-25 MED ORDER — MELOXICAM 15 MG PO TABS: 7.5000 mg | ORAL_TABLET | Freq: Every day | ORAL | 0 refills | Status: DC

## 2024-03-25 MED ORDER — DEXAMETHASONE SOD PHOSPHATE PF 10 MG/ML IJ SOLN
8.0000 mg | Freq: Once | INTRAMUSCULAR | Status: AC
  Administered 2024-03-25: 8 mg via INTRAVENOUS

## 2024-03-25 MED ORDER — METHOCARBAMOL 500 MG PO TABS
500.0000 mg | ORAL_TABLET | Freq: Once | ORAL | Status: AC
  Administered 2024-03-25: 500 mg via ORAL
  Filled 2024-03-25: qty 1

## 2024-03-25 NOTE — Discharge Instructions (Addendum)
  You have been seen in the Emergency Department (ED) for a migraine.  As we have discussed, please follow up with your doctor as soon as possible regarding today's ED visit and your headache symptoms.    Call your doctor or return to the Emergency Department (ED) if you have a worsening headache, sudden and severe headache, confusion, slurred speech, facial droop, weakness or numbness in any arm or leg, extreme fatigue, or other symptoms that concern you.   We believe that your symptoms are caused by musculoskeletal strain.  Please read through the included information about additional care such as heating pads, over-the-counter pain medicine. Remember that early mobility and using the affected part of your body is actually better than keeping it immobile. Use the spirometer as needed for pain to help with deep breathing,  You were prescribed Meloxicam (antiinflammatory) and Methocarbamol (muscle relaxer) to help with your pain.  Please take these medications only as prescribed. Please do not work, make legal-binding decisions, or operate a motor vehicle while taking the Methocarbamol. Do not take Baclofen while taking Methocarbamol. Please do not take Ibuprofen , Aleve , Advil , Motrin , Naproxen , Aspirin, or any other non-steroidal antiinflammatory drug (NSAID) while taking the Meloxicam. Please stop taking the Meloxicam if you experience any stomach cramping.  Follow-up with the doctor listed as recommended or return to the emergency department with new or worsening symptoms that concern you.

## 2024-03-25 NOTE — ED Triage Notes (Signed)
 Pt c/o right sided rib pain, short of breath, dizziness, and migraine since Monday, pt states she recently moved and felt a pop to her rib area

## 2024-03-25 NOTE — ED Provider Notes (Incomplete)
 [facility] Provider Note    Event Date/Time   First MD Initiated Contact with Patient 03/25/24 2047     (approximate)   History   Chest Pain (RIGHT RIB PAIN )   HPI  Dawn Meza is a 28 y.o. female  with a past medical history of asthma, depression, migraine with aura, anxiety, POTS, ovarian cyst presents to the emergency department with headache that wraps around her head in a bandlike distribution going into the back of her head that started today as well as right-sided rib pain that started on Monday.  Patient also reports some nausea, dizziness and blurry vision as well as increased rib pain with breathing. Reports her rib pain is worse with certain movements. She states her right anterior ribs feel crunchy.  She did help move one of her family members on Monday and was lifting heavy items at the time when the pain started afterwards.  She reports this headache feels different than her regular migraines. Patient denies fall or injury, cough, nasal congestion, loss of consciousness, double vision, chest pain, shortness of breath, vomiting, diarrhea, neck pain, abdominal pain or back pain, numbness or weakness in her arms or legs. No urinary symptoms. She has a prescription for rizatriptan, but states it makes her dizzy every time she take it so she did not take it when the headache began.   Physical Exam   Triage Vital Signs: ED Triage Vitals  Encounter Vitals Group     BP 03/25/24 1945 117/82     Girls Systolic BP Percentile --      Girls Diastolic BP Percentile --      Boys Systolic BP Percentile --      Boys Diastolic BP Percentile --      Pulse Rate 03/25/24 1945 87     Resp 03/25/24 1945 17     Temp 03/25/24 1945 97.9 F (36.6 C)     Temp Source 03/25/24 1945 Oral     SpO2 03/25/24 1945 100 %     Weight 03/25/24 1946 90 lb (40.8 kg)     Height 03/25/24 1946 5' (1.524 m)     Head Circumference --      Peak Flow --      Pain Score 03/25/24 1945  7     Pain Loc --      Pain Education --      Exclude from Growth Chart --     Most recent vital signs: Vitals:   03/25/24 1945  BP: 117/82  Pulse: 87  Resp: 17  Temp: 97.9 F (36.6 C)  SpO2: 100%    General: Awake, in no acute distress. Appears stated age. Head: Normocephalic, atraumatic. Eyes: PERRLA. EOMs intact. No nystagmus. Ears/Nose/Throat: TMs intact b/l. Nares patent, no nasal discharge. Oropharynx moist, no erythema or exudate. Dentition intact. Neck: Supple, no lymphadenopathy, no nuchal rigidity. No midline cervical tenderness. No meningismus. CV: Good peripheral perfusion. RRR. Respiratory:Normal respiratory effort.  No respiratory distress. CTAB. GI: Soft, non-distended, non-tender. No rebound or guarding. MSK: Normal ROM and 5/5 strength in b/l upper and lower extremities. Reproducible  Skin:Warm, dry, intact. No rashes, lesions, or ecchymosis. No cyanosis or pallor. Neurological: A&Ox4 to person, place, time, and situation. Cranial nerves II-XII intact. Sensation intact. Strength symmetric. No focal deficits. Normal finger to nose testing. Appropriate gait pattern with good balance. Mildly TTP along   ED Results / Procedures / Treatments   Labs (all labs ordered are listed, but only abnormal  results are displayed) Labs Reviewed  COMPREHENSIVE METABOLIC PANEL WITH GFR - Abnormal; Notable for the following components:      Result Value   Calcium 8.7 (*)    All other components within normal limits  CBC  POC URINE PREG, ED  POC URINE PREG, ED  TROPONIN I (HIGH SENSITIVITY)  TROPONIN I (HIGH SENSITIVITY)     EKG  NSR w/ sinus arrhythmia Rate:  80 bpm Axis: Positive P Waves: present before every QRS PR Interval: 124 ms QRS Complex: 76 bpm ST Segment: isometric QT interval: 388 ms T Waves: upright   RADIOLOGY CXR w/ right ribs and CT head ordered.   PROCEDURES:  Critical Care performed: No   Procedures   MEDICATIONS ORDERED IN  ED: Medications  diphenhydrAMINE  (BENADRYL ) injection 25 mg (25 mg Intravenous Not Given 03/25/24 2150)  sodium chloride  0.9 % bolus 1,000 mL (0 mLs Intravenous Stopped 03/25/24 2358)  ketorolac  (TORADOL ) 15 MG/ML injection 15 mg (15 mg Intravenous Given 03/25/24 2151)  diphenhydrAMINE  (BENADRYL ) capsule 50 mg (50 mg Oral Given 03/25/24 2212)  promethazine (PHENERGAN) tablet 25 mg (25 mg Oral Given 03/25/24 2212)  acetaminophen  (TYLENOL ) tablet 1,000 mg (1,000 mg Oral Given 03/25/24 2253)  dexamethasone  (DECADRON ) injection 8 mg (8 mg Intravenous Given 03/25/24 2254)  methocarbamol (ROBAXIN) tablet 500 mg (500 mg Oral Given 03/25/24 2253)     IMPRESSION / MDM / ASSESSMENT AND PLAN / ED COURSE  I reviewed the triage vital signs and the nursing notes.                              Differential diagnosis includes, but is not limited to, musculoskeletal strain, migraine, tension headache, acute intracranial finding, pneumothorax, ACS  Patient's presentation is most consistent with acute complicated illness / injury requiring diagnostic workup.  Patient here with signs and symptoms as described above.  She is well-appearing and with normal vital signs in the room.  Did decide to order a CT scan of her head given this headache felt different than her regular migraines. CMP and CBC unremarkable.  Both troponins less than 2.  UPT negative.  EKG with NSR with sinus arrhythmia, this is comparable to previous EKG on 01/19/2024. CT head without any acute abnormality. CXR w/ right ribs view without any acute rib fracture or pneumothorax. I independently viewed the x-ray and radiologist's report.  I agree with the radiologist's report that there is no acute rib fracture or pneumothorax.  Given the timing and mechanism of injury, believe this is likely due to musculoskeletal strain from lifting too strenuously on Monday.  Her head and rib pain is feeling better after IV fluids, Decadron , Tylenol , Robaxin, Toradol  and  Benadryl  and she is ready to go home. Given incentive spirometer to help her take deep breaths at home as well as Meloxicam and Methocarbamol prescriptions.  Discussed precautions regarding taking these medications.  Will have her follow-up with her primary care provider following today's visit and her cardiologist as needed.  The patient may return to the emergency department for any new, worsening, or concerning symptoms. Patient was given the opportunity to ask questions; all questions were answered. Emergency department return precautions were discussed with the patient.  Patient is in agreement to the treatment plan.  Patient is stable for discharge.    FINAL CLINICAL IMPRESSION(S) / ED DIAGNOSES   Final diagnoses:  Migraine without aura and without status migrainosus, not intractable  Rib pain  on right side     Rx / DC Orders   ED Discharge Orders          Ordered    Incentive spirometry RT        03/25/24 2343    meloxicam (MOBIC) 15 MG tablet  Daily        03/25/24 2347    methocarbamol (ROBAXIN) 500 MG tablet  Every 8 hours PRN        03/25/24 2347             Note:  This document was prepared using Dragon voice recognition software and may include unintentional dictation errors.     Sheron Salm, PA-C 03/26/24 1740    [address], [place], PA-C 03/26/24 1740    Dicky Anes, MD 03/27/24 [phone]

## 2024-03-26 LAB — TROPONIN I (HIGH SENSITIVITY): Troponin I (High Sensitivity): <2 ng/L (ref <18)

## 2024-04-04 DIAGNOSIS — F332 Major depressive disorder, recurrent severe without psychotic features: Secondary | ICD-10-CM | POA: Diagnosis not present

## 2024-04-05 ENCOUNTER — Ambulatory Visit: Admitting: Pediatrics

## 2024-04-05 ENCOUNTER — Ambulatory Visit: Payer: Self-pay | Admitting: Pediatrics

## 2024-04-05 VITALS — BP 95/68 | HR 79 | Temp 98.2°F | Ht 60.0 in | Wt 87.8 lb

## 2024-04-05 DIAGNOSIS — B002 Herpesviral gingivostomatitis and pharyngotonsillitis: Secondary | ICD-10-CM | POA: Insufficient documentation

## 2024-04-05 DIAGNOSIS — Z7689 Persons encountering health services in other specified circumstances: Secondary | ICD-10-CM

## 2024-04-05 DIAGNOSIS — M546 Pain in thoracic spine: Secondary | ICD-10-CM

## 2024-04-05 DIAGNOSIS — L989 Disorder of the skin and subcutaneous tissue, unspecified: Secondary | ICD-10-CM

## 2024-04-05 DIAGNOSIS — N644 Mastodynia: Secondary | ICD-10-CM

## 2024-04-05 LAB — PREGNANCY, URINE: Preg Test, Ur: NEGATIVE

## 2024-04-05 MED ORDER — CELECOXIB 100 MG PO CAPS: 100.0000 mg | ORAL_CAPSULE | Freq: Two times a day (BID) | ORAL | 1 refills | Status: AC

## 2024-04-05 NOTE — Progress Notes (Signed)
 Office Visit  BP 95/68   Pulse 79   Temp 98.2 F (36.8 C) (Oral)   Ht 5' (1.524 m)   Wt 87 lb 12.8 oz (39.8 kg)   SpO2 97%   BMI 17.15 kg/m    Subjective:    Patient ID: Dawn Meza, female    DOB: 11/09/1995, 28 y.o.   MRN: [id number]  HPI: Dawn Meza is a 28 y.o. female  Chief Complaint  Patient presents with   Back Pain    Discussed the use of AI scribe software for clinical note transcription with the patient, who gave verbal consent to proceed.  History of Present Illness   Dawn Meza is a 28 year old female who presents with intermittent back pain.  She experiences intermittent back pain located in the mid-thoracic region, with a particularly severe episode occurring yesterday. The pain sometimes affects her ability to breathe properly and has lasted for almost two weeks. She associates the onset of her pain with a work incident involving lifting a heavy patient. She reports that an x-ray was performed during a previous ER visit for rib pain.  She has been prescribed meloxicam and a muscle relaxant, but these medications have not been effective in alleviating her symptoms and cause side effects such as drowsiness. She states that she prefers to do exercises herself rather than take medication for her back pain.  She has recently stopped taking Wellbutrin  and feels good without it. She is currently undergoing ketamine therapy, which she rates as 'ten out of ten' for improving her mood, with effects lasting about three days after each session.  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 This error has caused significant distress, particularly during her labor, and she has had to repeatedly correct this misinformation.  She notes recent weight loss despite increased appetite and breast enlargement. Multiple pregnancy tests have been negative.  She reports a recurring bump near her nipple ring, which she is monitoring. She has not been experiencing regular menstrual cycles since having an IUD placed and has a history of ovarian cysts.  She expresses concerns about her IUD placement, noting significant pain and bleeding post-insertion, which was later attributed to an ovarian cyst. She has a history of anxiety, which was exacerbated by her experiences with her previous OB-GYN.  She works at [employer] and has a mother who works at [employer]. She has a history of anxiety and has been experiencing stress related to her medical care. No regular menstrual cycles since IUD placement.      Relevant past medical, surgical, family and social history reviewed and updated as indicated. Interim medical history since our last visit reviewed. Allergies and medications reviewed and updated.  ROS per HPI unless specifically indicated above     Objective:    BP 95/68   Pulse 79   Temp 98.2 F (36.8 C) (Oral)   Ht 5' (1.524 m)   Wt 87 lb 12.8 oz (39.8 kg)   SpO2 97%   BMI 17.15 kg/m   Wt Readings from Last 3 Encounters:  04/05/24 87 lb 12.8 oz (39.8 kg)  03/25/24 90 lb (40.8 kg)  02/23/24 91 lb (41.3 kg)     Physical Exam Constitutional:      Appearance: Normal appearance.  Pulmonary:     Effort: Pulmonary effort is normal.  Musculoskeletal:  General: Normal range of motion.  Skin:    Comments: Normal skin color  Neurological:     General: No focal deficit present.     Mental Status: She is alert. Mental status is at baseline.  Psychiatric:        Mood and Affect: Mood normal.        Behavior: Behavior normal.        Thought Content: Thought content normal.         02/23/2024   10:49 AM 11/23/2023   10:44 AM 10/27/2023    9:32 AM 10/15/2023   10:39 AM 09/29/2023   12:47 PM  Depression screen PHQ 2/9  Decreased Interest 2 1     Down, Depressed, Hopeless 2 1     PHQ - 2 Score 4 2     Altered sleeping 0 2     Tired,  decreased energy 2 3     Change in appetite 0 1     Feeling bad or failure about yourself  1 1     Trouble concentrating 1 1     Moving slowly or fidgety/restless 1 1     Suicidal thoughts 0 0     PHQ-9 Score 9  11      Difficult doing work/chores Very difficult Very difficult        Information is confidential and restricted. Go to Review Flowsheets to unlock data.   Data saved with a previous flowsheet row definition       02/23/2024   10:51 AM 11/23/2023   10:44 AM 10/27/2023    9:34 AM 10/15/2023   10:41 AM  GAD 7 : Generalized Anxiety Score  Nervous, Anxious, on Edge 3 3    Control/stop worrying 1 1    Worry too much - different things 2 1    Trouble relaxing 3 3    Restless 1 1    Easily annoyed or irritable 2 2    Afraid - awful might happen 1 0    Total GAD 7 Score 13 11    Anxiety Difficulty Very difficult Very difficult       Information is confidential and restricted. Go to Review Flowsheets to unlock data.       Assessment & Plan:  Assessment & Plan   Thoracic back pain, unspecified back pain laterality, unspecified chronicity Intermittent thoracic spine pain, likely musculoskeletal, possibly due to occupational strain. Previous medications ineffective and caused drowsiness. Prefers self-directed exercises. - Provided exercises for thoracic spine pain management. - Prescribed Celebrex, twice daily as needed, not exceeding twice daily. -     Celecoxib; Take 1 capsule (100 mg total) by mouth 2 (two) times daily.  Dispense: 60 capsule; Refill: 1  Oral herpes Occasional cold sores. Skin tag present since pregnancy. No infection or fluid accumulation. 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 No genital lesions.  Encounter to establish care -     Ambulatory referral to Obstetrics / Gynecology  Skin lesions Wants skin exam for moles and skin tags. -     Ambulatory referral to Dermatology  Breast tenderness Mild weight  loss and breast tenderness. Negative pregnancy tests. Possible hormonal changes or decreased muscle mass. - Ordered urine pregnancy test for peace of mind. -     POCT urine pregnancy -     Pregnancy, urine  Follow up plan: Return in about 3 months (around 07/06/2024) for r.  Hadassah SHAUNNA Nett, MD

## 2024-04-24 ENCOUNTER — Other Ambulatory Visit: Payer: Self-pay

## 2024-04-24 DIAGNOSIS — R42 Dizziness and giddiness: Secondary | ICD-10-CM

## 2024-04-24 DIAGNOSIS — G901 Familial dysautonomia [Riley-Day]: Secondary | ICD-10-CM

## 2024-04-24 DIAGNOSIS — G90A Postural orthostatic tachycardia syndrome (POTS): Secondary | ICD-10-CM

## 2024-04-24 DIAGNOSIS — R002 Palpitations: Secondary | ICD-10-CM

## 2024-04-26 ENCOUNTER — Ambulatory Visit

## 2024-04-28 ENCOUNTER — Ambulatory Visit: Payer: Self-pay

## 2024-05-24 ENCOUNTER — Ambulatory Visit: Admitting: Psychiatry

## 2024-07-12 ENCOUNTER — Ambulatory Visit: Admitting: Nurse Practitioner
